# Patient Record
Sex: Female | Born: 1975 | Race: Black or African American | Hispanic: No | Marital: Single | Smoking: Current every day smoker
Health system: Southern US, Community
[De-identification: ages and names within clinical notes are randomized; demographics above are authoritative.]

## PROBLEM LIST (undated history)

## (undated) VITALS — BP 141/100 | HR 73 | Temp 98.5°F | Resp 18 | Ht 67.0 in | Wt 253.0 lb

## (undated) DIAGNOSIS — F419 Anxiety disorder, unspecified: Secondary | ICD-10-CM

## (undated) DIAGNOSIS — D649 Anemia, unspecified: Secondary | ICD-10-CM

## (undated) DIAGNOSIS — F70 Mild intellectual disabilities: Secondary | ICD-10-CM

## (undated) DIAGNOSIS — G932 Benign intracranial hypertension: Secondary | ICD-10-CM

## (undated) DIAGNOSIS — F32A Depression, unspecified: Secondary | ICD-10-CM

## (undated) DIAGNOSIS — G43909 Migraine, unspecified, not intractable, without status migrainosus: Secondary | ICD-10-CM

## (undated) DIAGNOSIS — E876 Hypokalemia: Secondary | ICD-10-CM

## (undated) DIAGNOSIS — F329 Major depressive disorder, single episode, unspecified: Secondary | ICD-10-CM

## (undated) DIAGNOSIS — F259 Schizoaffective disorder, unspecified: Secondary | ICD-10-CM

## (undated) DIAGNOSIS — R17 Unspecified jaundice: Secondary | ICD-10-CM

## (undated) DIAGNOSIS — I10 Essential (primary) hypertension: Secondary | ICD-10-CM

## (undated) HISTORY — DX: Unspecified jaundice: R17

## (undated) HISTORY — DX: Hypokalemia: E87.6

## (undated) HISTORY — DX: Anemia, unspecified: D64.9

## (undated) HISTORY — PX: NO PAST SURGERIES: SHX2092

---

## 1998-02-01 ENCOUNTER — Emergency Department (HOSPITAL_COMMUNITY): Admission: EM | Admit: 1998-02-01 | Discharge: 1998-02-01 | Payer: Self-pay | Admitting: Emergency Medicine

## 1998-02-23 ENCOUNTER — Inpatient Hospital Stay (HOSPITAL_COMMUNITY): Admission: EM | Admit: 1998-02-23 | Discharge: 1998-03-01 | Payer: Self-pay | Admitting: Emergency Medicine

## 1999-04-18 ENCOUNTER — Emergency Department (HOSPITAL_COMMUNITY): Admission: EM | Admit: 1999-04-18 | Discharge: 1999-04-18 | Payer: Self-pay | Admitting: Emergency Medicine

## 1999-04-18 ENCOUNTER — Inpatient Hospital Stay (HOSPITAL_COMMUNITY): Admission: AD | Admit: 1999-04-18 | Discharge: 1999-04-26 | Payer: Self-pay | Admitting: *Deleted

## 2000-06-17 ENCOUNTER — Emergency Department (HOSPITAL_COMMUNITY): Admission: EM | Admit: 2000-06-17 | Discharge: 2000-06-17 | Payer: Self-pay | Admitting: Emergency Medicine

## 2000-11-30 ENCOUNTER — Emergency Department (HOSPITAL_COMMUNITY): Admission: EM | Admit: 2000-11-30 | Discharge: 2000-11-30 | Payer: Self-pay | Admitting: Emergency Medicine

## 2001-01-08 ENCOUNTER — Inpatient Hospital Stay (HOSPITAL_COMMUNITY): Admission: EM | Admit: 2001-01-08 | Discharge: 2001-01-12 | Payer: Self-pay | Admitting: *Deleted

## 2002-03-21 ENCOUNTER — Inpatient Hospital Stay (HOSPITAL_COMMUNITY): Admission: EM | Admit: 2002-03-21 | Discharge: 2002-03-26 | Payer: Self-pay | Admitting: Psychiatry

## 2002-09-29 ENCOUNTER — Encounter: Payer: Self-pay | Admitting: Emergency Medicine

## 2002-09-29 ENCOUNTER — Emergency Department (HOSPITAL_COMMUNITY): Admission: AC | Admit: 2002-09-29 | Discharge: 2002-09-30 | Payer: Self-pay

## 2005-06-23 ENCOUNTER — Emergency Department (HOSPITAL_COMMUNITY): Admission: EM | Admit: 2005-06-23 | Discharge: 2005-06-23 | Payer: Self-pay | Admitting: Emergency Medicine

## 2005-10-25 ENCOUNTER — Ambulatory Visit: Payer: Self-pay | Admitting: Nurse Practitioner

## 2005-11-22 ENCOUNTER — Ambulatory Visit: Payer: Self-pay | Admitting: Nurse Practitioner

## 2006-01-27 ENCOUNTER — Emergency Department (HOSPITAL_COMMUNITY): Admission: EM | Admit: 2006-01-27 | Discharge: 2006-01-27 | Payer: Self-pay | Admitting: Emergency Medicine

## 2006-03-25 ENCOUNTER — Emergency Department (HOSPITAL_COMMUNITY): Admission: EM | Admit: 2006-03-25 | Discharge: 2006-03-25 | Payer: Self-pay | Admitting: Emergency Medicine

## 2007-05-18 ENCOUNTER — Inpatient Hospital Stay (HOSPITAL_COMMUNITY): Admission: AD | Admit: 2007-05-18 | Discharge: 2007-05-18 | Payer: Self-pay | Admitting: Obstetrics & Gynecology

## 2008-08-20 ENCOUNTER — Inpatient Hospital Stay (HOSPITAL_COMMUNITY): Admission: AD | Admit: 2008-08-20 | Discharge: 2008-08-20 | Payer: Self-pay | Admitting: Family Medicine

## 2009-05-26 ENCOUNTER — Emergency Department (HOSPITAL_COMMUNITY): Admission: EM | Admit: 2009-05-26 | Discharge: 2009-05-26 | Payer: Self-pay | Admitting: Family Medicine

## 2010-09-13 ENCOUNTER — Emergency Department (HOSPITAL_COMMUNITY): Admission: EM | Admit: 2010-09-13 | Discharge: 2010-04-21 | Payer: Self-pay | Admitting: Emergency Medicine

## 2010-12-07 ENCOUNTER — Encounter: Payer: Self-pay | Admitting: Obstetrics & Gynecology

## 2010-12-17 ENCOUNTER — Inpatient Hospital Stay (INDEPENDENT_AMBULATORY_CARE_PROVIDER_SITE_OTHER)
Admission: RE | Admit: 2010-12-17 | Discharge: 2010-12-17 | Disposition: A | Discharge status: Home / Self Care | Payer: Medicaid Other | Source: Ambulatory Visit | Attending: Family Medicine | Admitting: Family Medicine

## 2010-12-17 DIAGNOSIS — IMO0002 Reserved for concepts with insufficient information to code with codable children: Secondary | ICD-10-CM

## 2010-12-22 LAB — COMPREHENSIVE METABOLIC PANEL
ALT: 23 U/L (ref 0–35)
AST: 32 U/L (ref 0–37)
Alkaline Phosphatase: 77 U/L (ref 39–117)
CO2: 26 mEq/L (ref 19–32)
Calcium: 9 mg/dL (ref 8.4–10.5)
GFR calc Af Amer: >60 mL/min (ref >60)
GFR calc non Af Amer: >60 mL/min (ref >60)
Glucose, Bld: 87 mg/dL (ref 70–99)
Potassium: 2.8 mEq/L — ABNORMAL LOW (ref 3.5–5.1)
Sodium: 145 mEq/L (ref 135–145)
Total Protein: 7.5 g/dL (ref 6.0–8.3)

## 2010-12-22 LAB — CULTURE, ROUTINE-ABSCESS

## 2010-12-22 LAB — CBC
HCT: 39.9 % (ref 36.0–46.0)
Hemoglobin: 13.4 g/dL (ref 12.0–15.0)
MCHC: 33.7 g/dL (ref 30.0–36.0)
WBC: 8.8 10*3/uL (ref 4.0–10.5)

## 2010-12-22 LAB — URINALYSIS, ROUTINE W REFLEX MICROSCOPIC
Glucose, UA: NEGATIVE mg/dL
Hgb urine dipstick: NEGATIVE
Ketones, ur: 15 mg/dL — AB
Specific Gravity, Urine: 1.037 — ABNORMAL HIGH (ref 1.005–1.030)
pH: 5 (ref 5.0–8.0)

## 2010-12-22 LAB — URINE MICROSCOPIC-ADD ON

## 2010-12-22 LAB — URINE CULTURE

## 2010-12-22 LAB — DIFFERENTIAL
Basophils Relative: 1 % (ref 0–1)
Eosinophils Absolute: 0.2 10*3/uL (ref 0.0–0.7)
Eosinophils Relative: 2 % (ref 0–5)
Lymphs Abs: 3.7 10*3/uL (ref 0.7–4.0)
Monocytes Relative: 9 % (ref 3–12)

## 2011-02-22 NOTE — Discharge Summary (Signed)
[facility]  Patient:    Sarah [name], Sarah [name]                     MRN: [id number] Adm. Date:  [id number] Disc. Date: [id number] Attending:  Denny Peon Dictator:   Landry Corporal, N.P.                           Discharge Summary  IDENTIFYING INFORMATION:  This is a 35 year old African-American female, single, admitted on a involuntary basis.  Patient was signed in by her legal guardian.  HISTORY OF PRESENT ILLNESS:   Patient was having an acute psychotic episode. An emergency commitment was done.  Immediately upon entering the unit, patient became completely undressed, running naked down the halls, screaming.  Patient was hypomanic and could not be controlled, could not be redirected.  Patient had to be secluded with forced medications and put in restraints, as she was threatening staff and other patients.  It appeared that patient might be responding to internal stimuli.  Patient was unable to give informed consent for treatment.  Patient was referred to Behavioral Health from [facility].  Patient apparently had been decompensating for the past 6 weeks and noncompliant with her medications.  Patient goes to [facility] for outpatient treatment.  PAST MEDICAL HISTORY:  Patient goes to [facility].  Patients medical problems include a urinary tract infection, uterine bleeding, and a history of anemia.  ADMISSION MEDICATIONS:  Bactrim double strength 1 p.o. b.i.d.  ALLERGIES:  No known drug allergies.  PHYSICAL EXAMINATION:  Performed at [facility] Emergency Department where she was treated for a urinary tract infection.  LAB WORK:  Urinalysis revealed with urinary tract infection with ketones +40, color was red and turbid, ______ with many ______ cells, positive nitrates, and leukocytes, moderate amount of leukocytes.  Other lab work was within normal limits.  MENTAL STATUS EXAMINATION: This is an  overweight young black female dressed in hospital gown.  She was passively cooperative, appeared to be somewhat sedated.  Her eye contact was fair at times.  Speech was actually very slow, probably due to her medication, generally relevant, and on admission her speech was pressured, mood was hypomanic, labile, and agitated, and her mood was certainly hostile.  Currently she seems to be a Coward bit sedated.  Her mood is perhaps a Dena bit suspicious.  Her flat is flat.  She denies any suicidal ideation or intent, denies homicidal ideation or intent.  Thought processes:  Prior to admission she was paranoid with delusional thinking that people were out to get her.  She acknowledges hearing voices telling her to beat somebody up just prior to admission, and currently denies any hallucinations, denies any delusions.  Cognitively, she is slightly sleepy, but she is oriented.  Her cognitive function appears to be consistent with her current intellectual functioning.  Her judgment is poor, insight is poor, and impulse control is poor.  ADMISSION DIAGNOSES: Axis I:     Schizoaffective disorder with manic state or bipolar disorder,             manic. 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 Axis III:   Vaginal bleeding, hypokalemia, urinary tract infection. Axis IV:    Severe, related to her mental illness. Axis V:  Current global assessment of function is 20, this past year             is 50.  HOSPITAL COURSE:  Patient was admitted for her psychotic behavior, was admitted under emergency commitment due to her acute psychosis.  Patient had to have medications forced and was placed in restraints.  We continued her Bactrim for her urinary tract infection.  Patient was also hypokalemic and was given potassium supplements.  Depakote was also administered for her manic symptoms.  Patient was to be monitored in regards to her fluids.   With medication patient became very calm and cooperative.  She was talking very lucid and able to contract for safety.  She continued to show no bizarre behaviors, at times needing some redirection but was able to follow instructions.  She continued to improve, not having any hallucinations. Patient continued to improve.  She was sleeping better.  Her appetite was good.  She was denying any depression or anxiety or dangerous thoughts.  Her thinking was clear.  Her potassium level had increased to within normal limits.  CONDITION ON DISCHARGE:  Patient was doing very well.  She was sleeping well, with no suicidal ideation.  She was to be discharged to her grandmother.  FOLLOW UP:  With [facility] with appointment and phone number provided.  Patient was to see Harolyn Rutherford at the reentry clinic.  There were no restrictions for diet or activity.  DISCHARGE MEDICATIONS: 1. Depakote 250 mg 1 t.i.d. 2. Haldol 5 mg 1 q.h.s. 3. Seroquel 25 mg 1 q.a.m., 4 p.m., and at night. 4. Cogentin 1 mg 1/2 to 1 three times a day as needed for side effects. 5. Bactrim Double Strength, 1 twice a day for the next 4 days.  INSTRUCTIONS:  That her grandmother will secure her medications.  She was to drink orange and tomato juice and her liver panel will be checked as well as her Depakote over the next 2 weeks.  Patient was to call or come to the emergency room if she was having problems with her medications or recurrence of symptoms.  Patient was also to make an appointment with her primary care Coben Godshall for abdominal cramps and low potassium level.  FINAL DIAGNOSES: Axis I:     Schizoaffective disorder with manic state, or bipolar disorder,             manic. Axis II:    Mild mental retardation. Axis III:   Vaginal bleeding, hypokalemia, urinary tract infection. Axis IV:    Severe psychosocial stressors. Axis V:     Current  global assessment of function 50, highest past  year 50. DD:  02/10/01 TD:  02/10/01 Job: [id number]

## 2011-02-22 NOTE — H&P (Signed)
[facility]  Patient:    Sarah [name], Sarah [name]                     MRN: [id number] Adm. Date:  [id number] Disc. Date: [id number] Attending:  Sandi Raveling Dictator:   Sarah [name], N.P.                         History and Physical  IDENTIFYING INFORMATION:  Sarah [name] is a 35 year old African-American female, single, admitted on a voluntary basis January 07, 2001 and she was signed in by her legal guardian, Sarah [name]. However, due to her acute psychosis, an emergency commitment was done. The patient immediately upon entering the unit became completely undressed, she was naked, running up and down the hall screaming and again, she was naked. She was near all patients who were lining up to go to lunch. She was totally out of control. She was hypermanic and could not be controlled, could not be redirected. The patient had to be secluded, forced medications and also put in restraints because she was threatening to staff and to other patients. Dr. Milford Cage and Dr. Netta Cedars wrote the order for forced medications due to her acute florid psychosis. It appeared that she might be responding to internal stimuli and the patient was unable to give informed consent for treatment.  She was referred by [facility]. When they called stated that this patient was hypomanic and noncompliant with her medications, however, when she came to our unit she was floridly psychotic. She was hypermanic, she was out of control and a danger to self and others, and also threatening staff and other patients. Again, she immediately became undressed, was running up and down the halls, yelling, screaming, totally out of control, and we could not redirect her. She was agitated, apparently had been decompensating x 6 weeks. She had not been taking her medications. She was thinking that she was pregnant, however, she is not.  Her urine pregnancy test was negative. She was thinking she needed a D&C. I do need to state here that she was having her menstrual period. She reports no sleep for three days and not eating for three days. She states she did have auditory hallucinations prior to admission and the voices were telling her to hurt somebody or to beat someone up. She denies suicidal ideation or intent, she denies homicidal ideation or intent.  PAST PSYCHIATRIC HISTORY:  The patient apparently goes to [facility], however, there does not appear to be any follow-up and she has not been taking her meds and decompensated and became psychotic. Unknown when she was last seen by the mental health center apparently up until yesterday, January 08, 2001. She has been at [facility] May 1999 and July 2001.  PAST MEDICAL HISTORY:  The patient goes to [facility]. She states she was last seen there within the last three months. 1. Patient has a urinary tract infection. No other significant medical problems currently. 2. History of uterine bleeding. 3. History of anemia.  CURRENT MEDICATIONS:  Bactrim DS one p.o. b.i.d.  ALLERGIES:  No known drug allergies.  POSITIVE PHYSICAL FINDINGS:  Please see physical examination done at [facility] emergency department January 07, 2001 where she was treated for a urinary tract infection. It was recommended that she go on Bactrim DS one p.o. b.i.d.  LABORATORY AND ACCESSORY  DATA:  Her CBC was within normal limits with the exception of MCV that was 82.5 which is really within normal limits, however, low normal. Her GC and chlamydia probes were both negative. Her urine pregnancy test was negative. Her CMET was within normal limits with the exception of potassium that was 2.6. Urine drug screen negative. Blood alcohol level less than 5. Her urinalysis revealed that she did indeed have a urinary tract infection, ketones +40,  the color was red and turbid, the protein was 1.00, there were many clue cells, nitrites positive and also she had leukocytes, a moderate amount.  SOCIAL HISTORY:  The patient lives alone. She has a roommate. She is single. Her grandmother lives up the street. Her sister, Sarah [name] is her legal guardian. She has three brothers and one sister. She is focusing a lot on her mother. She states she has not seen her mother in 20 years, does not know where she is and basically wants to see her. She completed high school, however, she was in special classes. She is on Medicaid.  FAMILY HISTORY:  Question if mother has mental illness.  ALCOHOL AND DRUG HISTORY:  She denies alcohol or substance abuse.  MENTAL STATUS EXAMINATION:  This patient presented as an overweight young black female dressed in a hospital gown. She was passively cooperative, appeared to be somewhat sedated. Her eye contact was good at times. Her speech actually was slow, probably due to the medication and it was generally relevant. On admission her speech was pressured. Mood:  She was hypermanic, she was labile and agitated and her mood was certainly hostile. Currently, she seems to be a [place] bit sedated but her mood is perhaps a [place] bit suspicious. Her affect is flat. She denies suicidal ideation or intent and denies homicidal ideation and intent. Thought processes:  Prior to admission she was paranoid and delusional thinking that people were out to get her. She acknowledges hearing voices telling her to beat somebody up just prior to admission and currently denies any hallucinations and denies any delusions. Cognitive:  Slightly sleepy but she is oriented. Her cognitive functioning appears to be consistent with her current level of intellectual functioning. Her judgment is poor, insight poor and impulse control is poor.  CURRENT DIAGNOSES: Axis I:                       Schizoaffective disorder with manic  state                               or bipolar disorder, manic. 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  Axis III:                     Vaginal bleeding.                               Hypokalemia.  Urinary tract infection. Axis IV:                      Severe related to her mental illness. Axis V:                       Current global assessment of functioning is                               20, highest in past year is 50.  TREATMENT PLAN AND RECOMMENDATIONS:  Voluntary admission to [facility] behavioral health unit. She was signed in by her legal guardian Sarah [name], her sister. However, when she entered the unit she was floridly psychotic, a danger to herself and others and an emergency commitment due to her acute psychosis and out of control behavior as well as running up in the halls naked, that we had to take other action. She was screaming, running up and down in front of other patients, threatening staff and other patients. She entered the unit in this condition. She was immediately placed on a one-to-one. SERT had to be called. We had to force medications. Dr. Milford Cage and Dr. Netta Cedars did this. She had to be placed in seclusion with restraints due to her danger to self and others. She was given Haldol 5 mg IM/po plus order to give it every two hours for a max dose of 30 mg every 24 hours, Ativan 2 mg IM/po q.4h. STAT and p.r.n. for severe agitation every 4 hours, Benadryl 25 mg p.o. q.4h. p.r.n. a.p.s. We are going to start her on Seroquel 25 mg q.i.d. p.o., Bactrim DS one tab p.o. b.i.d. x 8 days. She had hypokalemia so she was given K-Dur 20 mEq STAT and K-Dur 20 mEq at h.s., Depakote 250 mg p.o. t.i.d. for her manic symptoms. She is also to have her fluids monitored and the intake was to be recorded on the chart. We were able to  discontinue the restraints at 8:45 p.m. and she was more cooperative. Currently, she remains on a one-to-one due to her behavior over the last 24 hours. We will re-evaluate this.  Tentative length of stay and discharge plan is 3-7 days. DD:  01/09/01 TD:  01/09/01 Job: [id number]

## 2011-02-22 NOTE — Discharge Summary (Signed)
[facility]  Patient:    Sarah [name], Sarah [name] Visit Number: [id number] MRN: [id number]          Service Type: PSY Location: [id number] Attending Physician:  Jeanice Lim Dictated by:   Jeanice Lim, M.D. Admit Date:  03/21/2002 Discharge Date: 03/26/2002                             Discharge Summary  IDENTIFYING DATA:  This is a 35 year old single African-American female found naked in a trailer in a disheveled apartment, committed by family and [facility].  Has a history of mood swings.  No recent alcohol use.  Has been treated with Depakote, question of compliance.  Police brought the patient to [facility].  ADMISSION MEDICATIONS:  Depakote.  ALLERGIES:  No known drug allergies.  PHYSICAL EXAMINATION:  GENERAL:  Unremarkable.  NEUROLOGIC:  Nonfocal.  ROUTINE ADMISSION LABORATORY DATA:  Essentially within normal limits. Potassium low.  Valproic acid level on 6/17 22, on 6/20 83.  Thyroid panel: Within normal limits.  MENTAL STATUS EXAMINATION:  Alert and oriented with no psychomotor abnormalities.  The patient was dressed in pajamas.  Speech: Mostly clear. Mood: Calm but dysphoric.  Affect: Restricted.  Thought process: Positive for mild paranoid ideation, mood swings, and auditory hallucinations.  Cognitive: Intact.  The patient appeared to have borderline intelligence.  Judgment and insight: Limited.  ADMITTING DIAGNOSES: Axis I:    Schizoaffective disorder, bipolar type. Axis II:   None. Axis III:  None. Axis IV:   Moderate problems with primary support group and housing problems. Axis V:    35/55  HOSPITAL COURSE:  The patient was restabilized on medications and Zyprexa was added to stabilize mood and psychotic symptoms.  The patient reported a positive response to medication changes.  CONDITION AT DISCHARGE:  Markedly improved.  Mood: More euthymic.  Affect: Brighter.   Thought process: Goal directed.  Thought content: Negative for dangerous ideation or psychotic symptoms.  The patient reported motivation to be compliant with the followup plan and take medications as directed.  DISCHARGE MEDICATIONS: 1. Depakote ER 500 mg two q.h.s. 2. Zyprexa 7.5 mg q.h.s. 3. Trazodone 50 mg q.h.s. p.r.n. insomnia.  FOLLOWUP:  [facility], Monday, June 23 at 9:30.  DISCHARGE DIAGNOSES: Axis I:    Schizoaffective disorder, bipolar type. Axis II:   None. Axis III:  None. Axis IV:   Moderate problems with primary support group and housing problems. Axis V:    Global assessment of functioning on discharge was 55. Dictated by:   Jeanice Lim, M.D. Attending Physician:  Jeanice Lim DD:  04/28/02 TD:  04/30/02 Job: [id number]

## 2011-07-09 LAB — POCT PREGNANCY, URINE: Preg Test, Ur: NEGATIVE

## 2011-07-09 LAB — CBC
HCT: 36.9
Hemoglobin: 12.4
MCV: 91.3
RBC: 4.04
WBC: 7.4

## 2011-07-09 LAB — GC/CHLAMYDIA PROBE AMP, GENITAL
Chlamydia, DNA Probe: NEGATIVE
GC Probe Amp, Genital: NEGATIVE

## 2011-07-09 LAB — WET PREP, GENITAL: Yeast Wet Prep HPF POC: NONE SEEN

## 2011-07-22 LAB — WET PREP, GENITAL
Trich, Wet Prep: NONE SEEN
Yeast Wet Prep HPF POC: NONE SEEN

## 2011-07-22 LAB — URINALYSIS, ROUTINE W REFLEX MICROSCOPIC
Hgb urine dipstick: NEGATIVE
Protein, ur: NEGATIVE
Urobilinogen, UA: 0.2

## 2011-07-22 LAB — POCT PREGNANCY, URINE: Preg Test, Ur: NEGATIVE

## 2011-10-17 ENCOUNTER — Emergency Department (HOSPITAL_COMMUNITY)
Admission: EM | Admit: 2011-10-17 | Discharge: 2011-10-17 | Disposition: A | Discharge status: Home / Self Care | Payer: Medicaid Other | Attending: Emergency Medicine | Admitting: Emergency Medicine

## 2011-10-17 ENCOUNTER — Encounter (HOSPITAL_COMMUNITY): Payer: Self-pay

## 2011-10-17 DIAGNOSIS — R220 Localized swelling, mass and lump, head: Secondary | ICD-10-CM | POA: Insufficient documentation

## 2011-10-17 DIAGNOSIS — I10 Essential (primary) hypertension: Secondary | ICD-10-CM | POA: Insufficient documentation

## 2011-10-17 DIAGNOSIS — R51 Headache: Secondary | ICD-10-CM | POA: Insufficient documentation

## 2011-10-17 DIAGNOSIS — R221 Localized swelling, mass and lump, neck: Secondary | ICD-10-CM | POA: Insufficient documentation

## 2011-10-17 DIAGNOSIS — K047 Periapical abscess without sinus: Secondary | ICD-10-CM | POA: Insufficient documentation

## 2011-10-17 HISTORY — DX: Essential (primary) hypertension: I10

## 2011-10-17 MED ORDER — HYDROCODONE-ACETAMINOPHEN 5-500 MG PO TABS: 1.0000 | ORAL_TABLET | Freq: Four times a day (QID) | ORAL | Status: AC | PRN

## 2011-10-17 MED ORDER — CLINDAMYCIN HCL 150 MG PO CAPS: 300.0000 mg | ORAL_CAPSULE | Freq: Four times a day (QID) | ORAL | Status: AC

## 2011-10-17 MED ORDER — IBUPROFEN 200 MG PO TABS
600.0000 mg | ORAL_TABLET | Freq: Once | ORAL | Status: AC
  Administered 2011-10-17: 600 mg via ORAL
  Filled 2011-10-17: qty 3

## 2011-10-17 MED ORDER — CLINDAMYCIN HCL 300 MG PO CAPS
300.0000 mg | ORAL_CAPSULE | Freq: Once | ORAL | Status: AC
  Administered 2011-10-17: 300 mg via ORAL
  Filled 2011-10-17: qty 1

## 2011-10-17 MED ORDER — HYDROCODONE-ACETAMINOPHEN 5-325 MG PO TABS
1.0000 | ORAL_TABLET | Freq: Once | ORAL | Status: AC
  Administered 2011-10-17: 1 via ORAL
  Filled 2011-10-17: qty 1

## 2011-10-17 MED ORDER — IBUPROFEN 400 MG PO TABS: 400.0000 mg | ORAL_TABLET | ORAL | Status: AC | PRN

## 2011-10-17 NOTE — ED Notes (Signed)
Pt states that 2 days ago she developed an abscess on the left lower portion of her jaw. Pt is unsure if she has a broken tooth or a cavity associated with the left last molar. No meds pta. Unable to eat due to the pain.

## 2011-10-17 NOTE — ED Provider Notes (Signed)
History     CSN: [id number]  Arrival date & time 10/17/11  1520   First MD Initiated Contact with Patient 10/17/11 1940      Chief Complaint  Patient presents with  . Abscess    (Consider location/radiation/quality/duration/timing/severity/associated sxs/prior treatment) HPI history from patient Patient is a 36 year old female with history of poor dentition presents with dental abscess and facial swelling. She has had pain and swelling for 2 days. It has been constant and progressively worsening. Pain is worse when she eats and drinks. No fever, nausea, vomiting or other systemic symptoms. She is able to open mouth fully. Tolerating by mouth fine. Overall severity moderate.  Past Medical History  Diagnosis Date  . Hypertension     History reviewed. No pertinent past surgical history.  History reviewed. No pertinent family history.  History  Substance Use Topics  . Smoking status: Current Everyday Smoker  . Smokeless tobacco: Not on file  . Alcohol Use: No    OB History    Grav Para Term Preterm Abortions TAB SAB Ect Mult Living                  Review of Systems  Constitutional: Negative for fever and chills.  HENT: Negative for facial swelling.   Eyes: Negative for visual disturbance.  Respiratory: Negative for cough, chest tightness, shortness of breath and wheezing.   Cardiovascular: Negative for chest pain.  Gastrointestinal: Negative for nausea, vomiting, abdominal pain and diarrhea.  Genitourinary: Negative for difficulty urinating.  Skin: Negative for rash.  Neurological: Negative for weakness and numbness.  Psychiatric/Behavioral: Negative for behavioral problems and confusion.  All other systems reviewed and are negative.    Allergies  Review of patient's allergies indicates no known allergies.  Home Medications   Current Outpatient Rx  Name Route Sig Dispense Refill  . CLINDAMYCIN HCL 150 MG PO CAPS Oral Take 2 capsules (300 mg total) by mouth  every 6 (six) hours. 56 capsule 0  . HYDROCODONE-ACETAMINOPHEN 5-500 MG PO TABS Oral Take 1-2 tablets by mouth every 6 (six) hours as needed for pain. 20 tablet 0  . IBUPROFEN 400 MG PO TABS Oral Take 1 tablet (400 mg total) by mouth every 4 (four) hours as needed for pain. 30 tablet 0    BP 144/72  Pulse 81  Temp(Src) 98.7 F (37.1 C) (Oral)  Resp 20  SpO2 98%  LMP 09/22/2011  Physical Exam  Nursing note and vitals reviewed. Constitutional: She is oriented to person, place, and time. She appears well-developed and well-nourished. No distress.  HENT:  Head: Normocephalic and atraumatic.  Nose: Nose normal.       Poor dentition throughout Left lower molars: Small dental abscess the first and second lower left molar. Abscesses draining on her own. No significant remaining fluctuance or induration. Associated dental decay. Mild facial swelling to left lower jaw is tender to palpation. No significant swelling to submental or submandibular space. No tenderness palpating floor of mouth.  Eyes: EOM are normal.  Neck: Normal range of motion. No JVD present.  Cardiovascular: Normal rate and intact distal pulses.   Pulmonary/Chest: Effort normal. No respiratory distress.  Abdominal: Soft.       No visible injury  Musculoskeletal: Normal range of motion.       No pain with range of motion and no visible injury  Neurological: She is alert and oriented to person, place, and time. No cranial nerve deficit. Coordination normal.  Skin: Skin is warm and dry.  She is not diaphoretic.  Psychiatric: She has a normal mood and affect. Her behavior is normal. Thought content normal.    ED Course  Procedures (including critical care time)  Labs Reviewed - No data to display No results found.   1. Dental abscess       MDM   Clinical picture is consistent with dental abscess and associated facial swelling. Patient has no trismus, fever, difficulty tolerating by mouth. She overall is well  appearing. She has dentist but would prefer referral to another one. Since abscesses draining spontaneously and was completely drained, no indication for further incision and drainage. We'll treat with clindamycin considering facial swelling and have her follow up with dentistry. Return precautions discussed.        Milus Glazier 10/18/11 0144

## 2011-10-17 NOTE — ED Provider Notes (Signed)
36 year old female comes in with progressive pain and swelling in the left side of her jaw for the last several days. It hurts to eat and it hurts to open her mouth. She denies fever chills or sweats. Examination shows a periapical abscess of tooth #18 with pus being able to be expressed. There is moderate amount of soft tissue swelling in the left submandibular area. She will need to be referred to dentist for probable root canal. Today she'll be sent home on antibiotics and analgesics.  [name], MD 10/17/11 2016

## 2011-10-17 NOTE — ED Notes (Signed)
First meeting patient. Patient states she noticed her left side jaw was sore x 2 days ago and since then it has continued to increase in pain and today the pain is worse and the left jaw area is swollen. Patient states that her left jaw hurts to touch or open her mouth. Left jaw does appear swollen and is warm to touch.  Patient denies eating anything that would have injured a tooth and denies hitting herself or being hit to cause an injury to the left jaw.

## 2011-10-20 NOTE — ED Provider Notes (Signed)
Medical screening examination/treatment/procedure(s) were performed by non-physician practitioner and as supervising physician I was immediately available for consultation/collaboration.   [name], MD 10/20/11 [phone]

## 2012-01-01 ENCOUNTER — Encounter (HOSPITAL_COMMUNITY): Payer: Self-pay | Admitting: *Deleted

## 2012-01-01 ENCOUNTER — Emergency Department (INDEPENDENT_AMBULATORY_CARE_PROVIDER_SITE_OTHER)
Admission: EM | Admit: 2012-01-01 | Discharge: 2012-01-01 | Disposition: A | Discharge status: Home / Self Care | Payer: Medicaid Other | Source: Home / Self Care | Attending: Emergency Medicine | Admitting: Emergency Medicine

## 2012-01-01 DIAGNOSIS — L03211 Cellulitis of face: Secondary | ICD-10-CM

## 2012-01-01 DIAGNOSIS — L0291 Cutaneous abscess, unspecified: Secondary | ICD-10-CM

## 2012-01-01 DIAGNOSIS — L0390 Cellulitis, unspecified: Secondary | ICD-10-CM

## 2012-01-01 MED ORDER — SULFAMETHOXAZOLE-TMP DS 800-160 MG PO TABS: 2.0000 | ORAL_TABLET | Freq: Two times a day (BID) | ORAL | Status: AC

## 2012-01-01 NOTE — ED Notes (Signed)
Boil  On  l  Side     Face        X  3  Days         Pt  Reports  She  Has  Been  Applying  Warm  Rags  To  The  Area    denys  Any other  Symptoms         Airway    Intact

## 2012-01-01 NOTE — ED Provider Notes (Signed)
Chief Complaint  Patient presents with  . Recurrent Skin Infections    History of Present Illness:   The patient is a 36 year old female who has had a three-day history of a painful abscess on her left jaw. This has been draining some pus. She's never had anything like this before, and denies any abscesses elsewhere. She denies fever, chills, history of MRSA, or history of diabetes.  Review of Systems:  Other than noted above, the patient denies any of the following symptoms: Systemic:  No fever, chills or sweats. Skin:  No rash or itching.  PMFSH:  Past medical history, family history, social history, meds, and allergies were reviewed.  Physical Exam:   Vital signs:  BP 190/98  Pulse 72  Temp(Src) 98.6 F (37 C) (Oral)  Resp 16  SpO2 100%  LMP 01/01/2012 Skin:  There is a 1.5 cm raised, tender, fluctuant, abscess on the left jaw which was draining a small amount of pus.  Otherwise normal.  No rash.  Procedure:  Verbal informed consent was obtained.  The patient was informed of the risks and benefits of the procedure and understands and accepts.  Identity of the patient was verified verbally and by wristband.   The abscess area described above was prepped with Betadine and alcohol and anesthetized with 3 mL of 2% Xylocaine with epinephrine.  Using a #11 scalpel blade, a singe straight incision was made into the area of fluctulence, yielding a small amount of prurulent drainage.  Routine cultures were obtained.  Blunt dissection was used to break up loculations and the resulting wound cavity was packed with 1/4 inch Iodoform gauze.  A sterile pressure dressing was applied.  Assessment:   Diagnoses that have been ruled out:  None  Diagnoses that are still under consideration:  None  Final diagnoses:  Abscess    Plan:   1.  The following meds were prescribed:   New Prescriptions   SULFAMETHOXAZOLE-TRIMETHOPRIM (BACTRIM DS) 800-160 MG PER TABLET    Take 2 tablets by mouth 2 (two)  times daily.   2.  The patient was instructed in symptomatic care and handouts were given. 3.  The patient was instructed to leave the dressing in place and return again in 48 hours for packing removal.  Reuben Likes, MD 01/01/12 2200

## 2012-01-01 NOTE — Discharge Instructions (Signed)

## 2012-01-04 LAB — CULTURE, ROUTINE-ABSCESS: Gram Stain: NONE SEEN

## 2012-01-14 ENCOUNTER — Emergency Department (HOSPITAL_COMMUNITY)
Admission: EM | Admit: 2012-01-14 | Discharge: 2012-01-15 | Disposition: A | Discharge status: Other Acute Inpatient Hospital | Payer: Medicaid Other | Source: Home / Self Care | Attending: Emergency Medicine | Admitting: Emergency Medicine

## 2012-01-14 ENCOUNTER — Other Ambulatory Visit: Payer: Self-pay

## 2012-01-14 ENCOUNTER — Encounter (HOSPITAL_COMMUNITY): Payer: Self-pay

## 2012-01-14 DIAGNOSIS — I10 Essential (primary) hypertension: Secondary | ICD-10-CM | POA: Insufficient documentation

## 2012-01-14 DIAGNOSIS — I440 Atrioventricular block, first degree: Secondary | ICD-10-CM | POA: Insufficient documentation

## 2012-01-14 DIAGNOSIS — T394X2A Poisoning by antirheumatics, not elsewhere classified, intentional self-harm, initial encounter: Secondary | ICD-10-CM | POA: Insufficient documentation

## 2012-01-14 DIAGNOSIS — F17200 Nicotine dependence, unspecified, uncomplicated: Secondary | ICD-10-CM | POA: Insufficient documentation

## 2012-01-14 DIAGNOSIS — R45851 Suicidal ideations: Secondary | ICD-10-CM

## 2012-01-14 DIAGNOSIS — T1491XA Suicide attempt, initial encounter: Secondary | ICD-10-CM

## 2012-01-14 DIAGNOSIS — T398X2A Poisoning by other nonopioid analgesics and antipyretics, not elsewhere classified, intentional self-harm, initial encounter: Secondary | ICD-10-CM | POA: Insufficient documentation

## 2012-01-14 DIAGNOSIS — T39314A Poisoning by propionic acid derivatives, undetermined, initial encounter: Secondary | ICD-10-CM | POA: Insufficient documentation

## 2012-01-14 DIAGNOSIS — T50901A Poisoning by unspecified drugs, medicaments and biological substances, accidental (unintentional), initial encounter: Secondary | ICD-10-CM

## 2012-01-14 LAB — RAPID URINE DRUG SCREEN, HOSP PERFORMED
Amphetamines: NOT DETECTED
Cocaine: NOT DETECTED
Opiates: NOT DETECTED
Tetrahydrocannabinol: POSITIVE — AB

## 2012-01-14 LAB — COMPREHENSIVE METABOLIC PANEL
ALT: 15 U/L (ref 0–35)
Albumin: 3.6 g/dL (ref 3.5–5.2)
Alkaline Phosphatase: 76 U/L (ref 39–117)
Potassium: 4.2 mEq/L (ref 3.5–5.1)
Sodium: 138 mEq/L (ref 135–145)
Total Protein: 7.2 g/dL (ref 6.0–8.3)

## 2012-01-14 LAB — CBC
MCHC: 33.2 g/dL (ref 30.0–36.0)
RDW: 13.8 % (ref 11.5–15.5)

## 2012-01-14 MED ORDER — ARIPIPRAZOLE 2 MG PO TABS
2.0000 mg | ORAL_TABLET | Freq: Every day | ORAL | Status: DC
Start: 2012-01-14 — End: 2012-01-15
  Administered 2012-01-14: 2 mg via ORAL
  Filled 2012-01-14 (×2): qty 1

## 2012-01-14 MED ORDER — ALUM & MAG HYDROXIDE-SIMETH 200-200-20 MG/5ML PO SUSP
30.0000 mL | ORAL | Status: DC | PRN
  Filled 2012-01-14: qty 30

## 2012-01-14 MED ORDER — MAGNESIUM HYDROXIDE 400 MG/5ML PO SUSP
30.0000 mL | Freq: Every day | ORAL | Status: DC | PRN
  Filled 2012-01-14: qty 30

## 2012-01-14 MED ORDER — ALUM & MAG HYDROXIDE-SIMETH 200-200-20 MG/5ML PO SUSP: 30.0000 mL | ORAL | Status: DC | PRN

## 2012-01-14 MED ORDER — HYDROCHLOROTHIAZIDE 25 MG PO TABS
25.0000 mg | ORAL_TABLET | Freq: Every day | ORAL | Status: DC
  Administered 2012-01-14: 25 mg via ORAL
  Filled 2012-01-14 (×2): qty 1

## 2012-01-14 MED ORDER — ACETAMINOPHEN 325 MG PO TABS
650.0000 mg | ORAL_TABLET | Freq: Four times a day (QID) | ORAL | Status: DC | PRN
  Filled 2012-01-14: qty 2

## 2012-01-14 MED ORDER — ONDANSETRON HCL 4 MG PO TABS: 4.0000 mg | ORAL_TABLET | Freq: Three times a day (TID) | ORAL | Status: DC | PRN

## 2012-01-14 NOTE — ED Provider Notes (Addendum)
History     CSN: [id number]  Arrival date & time 01/14/12  1139   First MD Initiated Contact with Patient 01/14/12 1227      Chief Complaint  Patient presents with  . V70.1    states s/i     (Consider location/radiation/quality/duration/timing/severity/associated sxs/prior treatment) HPI Patient is a 36 year old female who presents today for suicidal ideation as well as attempt by taking pills. She took 10 pills of ibuprofen as well as 10 pills of other unknown pain medication. This occurred prior to arrival. Patient is not having any nausea, vomiting, abdominal pain, altered mental status. She reports that she recently had the death of a family member and did not want to live because of this. She has no medical problems other than high blood pressure. Patient does have depression and reports that she is treated with this.There are no other associated or modifying factors.  Past Medical History  Diagnosis Date  . Hypertension     History reviewed. No pertinent past surgical history.  No family history on file.  History  Substance Use Topics  . Smoking status: Current Everyday Smoker  . Smokeless tobacco: Not on file  . Alcohol Use: No    OB History    Grav Para Term Preterm Abortions TAB SAB Ect Mult Living                  Review of Systems  Constitutional: Negative.   HENT: Negative.   Eyes: Negative.   Respiratory: Negative.   Cardiovascular: Negative.   Gastrointestinal: Negative.   Genitourinary: Negative.   Musculoskeletal: Negative.   Skin: Negative.   Neurological: Negative.   Hematological: Negative.   Psychiatric/Behavioral: Positive for suicidal ideas and dysphoric mood.  All other systems reviewed and are negative.    Allergies  Review of patient's allergies indicates no known allergies.  Home Medications   Current Outpatient Rx  Name Route Sig Dispense Refill  . ARIPIPRAZOLE 2 MG PO TABS Oral Take 2 mg by mouth daily.    Marland Kitchen  HYDROCHLOROTHIAZIDE 25 MG PO TABS Oral Take 25 mg by mouth daily.      BP 166/87  Pulse 105  Temp 98.7 F (37.1 C)  Resp 22  SpO2 100%  LMP 11/27/2011  Physical Exam  Nursing note and vitals reviewed. GEN: Well-developed, well-nourished female in no distress HEENT: Atraumatic, normocephalic. Oropharynx clear without erythema EYES: PERRLA BL, no scleral icterus. NECK: Trachea midline, no meningismus CV: regular rate and rhythm. No murmurs, rubs, or gallops PULM: No respiratory distress.  No crackles, wheezes, or rales. GI: soft, non-tender. No guarding, rebound, or tenderness. + bowel sounds  GU: deferred Neuro: cranial nerves 2-12 intact, no abnormalities of strength or sensation, A and O x 3 MSK: Patient moves all 4 extremities symmetrically, no deformity, edema, or injury noted Skin: No rashes petechiae, purpura, or jaundice Psych: Endorses SI with attempt today.  ED Course  Procedures (including critical care time)   Date: 01/14/2012  Rate: 59  Rhythm: normal sinus rhythm  QRS Axis: normal  Intervals: PR prolonged  ST/T Wave abnormalities: normal  Conduction Disutrbances:first-degree A-V block   Narrative Interpretation: No concerning findings.  Old EKG Reviewed: none available        Labs Reviewed  CBC  COMPREHENSIVE METABOLIC PANEL  ETHANOL  ACETAMINOPHEN LEVEL  PREGNANCY, URINE  URINE RAPID DRUG SCREEN (HOSP PERFORMED)   No results found.   1. Ingestion of unknown drug   2. Suicide attempt   3.  Suicidal ideation       MDM  Patient was evaluated by myself. She was hemodynamically stable and asymptomatic. Patient is medically cleared. She was seen in behavioral health for this previously and has already been accepted for bad. Patient was moved to psych ED and holding orders were placed. Home medications were ordered. Behavioral health will be notified that patient's medically clear at this time.  2:38 PM Act has  been notified that the patient is  medically clear. She has been accepted by Dr. Renato Gails living. She will be moved to behavioral health once a 400 bed is available.        Cyndra Numbers, MD 01/14/12 1436  Cyndra Numbers, MD 01/14/12 [phone]

## 2012-01-14 NOTE — ED Notes (Signed)
Attempted to call report nurse to call back.

## 2012-01-14 NOTE — ED Provider Notes (Signed)
The patient was accepted at [facility] by Dr. Peyton Bottoms, MD 01/14/12 2350

## 2012-01-14 NOTE — ED Notes (Signed)
Attempted to call report x2 nurse unavailable at present time

## 2012-01-14 NOTE — ED Notes (Signed)
Pt went to [facility] for a bed. Pt escorted by [facility] tech. Pt has a bed pending at [facility] but does not have on at this time

## 2012-01-14 NOTE — ED Notes (Signed)
Pt states that her grandmother passed away last week and since then has had increasing depression.  Attempted suicide two days ago by taking an overdose of OTC pain meds.  Says she no longer feels SI and is denying HI.
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
## 2012-01-14 NOTE — ED Notes (Signed)
Pt states thoughts of s/i states took 10 Advil and 10 unknown otc pain medication

## 2012-01-15 ENCOUNTER — Inpatient Hospital Stay (HOSPITAL_COMMUNITY)
Admission: RE | Admit: 2012-01-15 | Discharge: 2012-01-17 | DRG: 885 | Disposition: A | Discharge status: Home / Self Care | Payer: Medicaid Other | Attending: Psychiatry | Admitting: Psychiatry

## 2012-01-15 ENCOUNTER — Encounter (HOSPITAL_COMMUNITY): Payer: Self-pay | Admitting: *Deleted

## 2012-01-15 DIAGNOSIS — F251 Schizoaffective disorder, depressive type: Secondary | ICD-10-CM | POA: Diagnosis present

## 2012-01-15 DIAGNOSIS — F259 Schizoaffective disorder, unspecified: Principal | ICD-10-CM | POA: Diagnosis present

## 2012-01-15 DIAGNOSIS — F1210 Cannabis abuse, uncomplicated: Secondary | ICD-10-CM | POA: Diagnosis present

## 2012-01-15 DIAGNOSIS — F411 Generalized anxiety disorder: Secondary | ICD-10-CM | POA: Diagnosis present

## 2012-01-15 DIAGNOSIS — I10 Essential (primary) hypertension: Secondary | ICD-10-CM | POA: Diagnosis present

## 2012-01-15 DIAGNOSIS — F17200 Nicotine dependence, unspecified, uncomplicated: Secondary | ICD-10-CM | POA: Diagnosis present

## 2012-01-15 HISTORY — DX: Depression, unspecified: F32.A

## 2012-01-15 HISTORY — DX: Major depressive disorder, single episode, unspecified: F32.9

## 2012-01-15 HISTORY — DX: Anxiety disorder, unspecified: F41.9

## 2012-01-15 LAB — URINALYSIS, ROUTINE W REFLEX MICROSCOPIC
Bilirubin Urine: NEGATIVE
Hgb urine dipstick: NEGATIVE
Protein, ur: NEGATIVE mg/dL
Urobilinogen, UA: 0.2 mg/dL (ref 0.0–1.0)

## 2012-01-15 MED ORDER — ACETAMINOPHEN 325 MG PO TABS
650.0000 mg | ORAL_TABLET | Freq: Four times a day (QID) | ORAL | Status: DC | PRN
  Administered 2012-01-15: 650 mg via ORAL

## 2012-01-15 MED ORDER — NICOTINE 14 MG/24HR TD PT24
14.0000 mg | MEDICATED_PATCH | Freq: Every day | TRANSDERMAL | Status: DC
  Administered 2012-01-15: 14 mg via TRANSDERMAL
  Filled 2012-01-15 (×4): qty 1

## 2012-01-15 MED ORDER — MAGNESIUM HYDROXIDE 400 MG/5ML PO SUSP: 30.0000 mL | Freq: Every day | ORAL | Status: DC | PRN

## 2012-01-15 MED ORDER — ALUM & MAG HYDROXIDE-SIMETH 200-200-20 MG/5ML PO SUSP: 30.0000 mL | ORAL | Status: DC | PRN

## 2012-01-15 MED ORDER — PNEUMOCOCCAL VAC POLYVALENT 25 MCG/0.5ML IJ INJ
0.5000 mL | INJECTION | Freq: Once | INTRAMUSCULAR | Status: AC
  Administered 2012-01-15: 0.5 mL via INTRAMUSCULAR

## 2012-01-15 MED ORDER — CITALOPRAM HYDROBROMIDE 20 MG PO TABS
20.0000 mg | ORAL_TABLET | Freq: Every day | ORAL | Status: DC
  Administered 2012-01-15 – 2012-01-17 (×3): 20 mg via ORAL
  Filled 2012-01-15 (×4): qty 1

## 2012-01-15 MED ORDER — HYDROCHLOROTHIAZIDE 25 MG PO TABS
25.0000 mg | ORAL_TABLET | Freq: Every day | ORAL | Status: DC
  Administered 2012-01-15 – 2012-01-17 (×3): 25 mg via ORAL
  Filled 2012-01-15 (×4): qty 1

## 2012-01-15 MED ORDER — ARIPIPRAZOLE 5 MG PO TABS
5.0000 mg | ORAL_TABLET | Freq: Every day | ORAL | Status: DC
  Administered 2012-01-15 – 2012-01-16 (×2): 5 mg via ORAL
  Filled 2012-01-15 (×3): qty 1

## 2012-01-15 NOTE — ED Notes (Signed)
Pt educated about transfer to [facility] for inpt treatment. Pt verbalizes understanding and consents to transfer. Denies pain and further need, departs in safe and stable condition with one bag of belongings sent with her.

## 2012-01-15 NOTE — Progress Notes (Signed)
Pt has been up and has been active while in the milieu today, has been attending and participating in various activities, pt did endorse feelings of depression today and mentioned a recent death in the family as being a stressor, pt did receive all medications today without incident, will continue to monitor

## 2012-01-15 NOTE — Progress Notes (Addendum)
36yo female who presents voluntarily and in no acute distress for the treatment of SI, Depression, and Anxiety. Appears flat and depressed. Calm and cooperative with assessment. Somewhat childlike with her responses at times. No acute distress noted. States she has been getting increasingly depressed since the unexpected death of her Grandmother last week. States she was feeling suicidal and did attempt to OD on Sunday by taking 10 Advil and "10 other pills". Decided to come in yesterday for assessment. Over the last week she has experienced: Anxiety;Depression;Crying spells;Irritability;Insomnia;Loneliness;Nervousness;Restlessness;Sadness;Self-harm thoughts;Sleep disturbance and Worrying. Currently denies SI/HI/AVH and contracts for safety. States she does sometimes hear voices, but she was vague in her description of type and content. Lives with a roommate whom she considers her biggest support now that her Gearldine Shown has passed. Has been compliant with her meds. Denies Etoh and drug abuse, but UDS was positive for [facility] (she adamantly denied when confronted). Medical h/o HTN and is a 1/2 pk/day smoker. Patch and gum offered and patch accepted. Denies pain. Skin assessed by [facility], Rn and found to be clear. Food and fluids offered and refused. Unit policies and expectations reviewed and understanding verbalized. Consents obtained. Belongings searched by Mitzi Davenport, [facility] and items were placed in locker # 121. Escorted to unit and oriented by [place], [facility]. Emergency Contact Real Cons (roommate) [phone].

## 2012-01-15 NOTE — Progress Notes (Signed)
01/15/2012         Time: 0930      Group Topic/Focus: The focus of this group is on enhancing the patient's understanding of leisure, barriers to leisure, and the importance of engaging in positive leisure activities upon discharge for improved total health.  Participation Level: Minimal  Participation Quality: Attentive  Affect: Appropriate  Cognitive: Oriented  Additional Comments: Patient missed much of group as she was called out by [facility].   Cystal Shannahan 01/15/2012 12:54 PM

## 2012-01-15 NOTE — Tx Team (Signed)
Initial Interdisciplinary Treatment Plan  PATIENT STRENGTHS: (choose at least two) Ability for insight Active sense of humor Average or above average intelligence Capable of independent living Communication skills General fund of knowledge Motivation for treatment/growth Physical Health Supportive family/friends  PATIENT STRESSORS: Financial difficulties Loss of grandmother last week* Traumatic event   PROBLEM LIST: Problem List/Patient Goals Date to be addressed Date deferred Reason deferred Estimated date of resolution  Depression      Anxiety      SI                                           DISCHARGE CRITERIA:  Ability to meet basic life and health needs Adequate post-discharge living arrangements Improved stabilization in mood, thinking, and/or behavior Motivation to continue treatment in a less acute level of care Need for constant or close observation no longer present Reduction of life-threatening or endangering symptoms to within safe limits Safe-care adequate arrangements made Verbal commitment to aftercare and medication compliance  PRELIMINARY DISCHARGE PLAN: Outpatient therapy Return to previous living arrangement  PATIENT/FAMIILY INVOLVEMENT: This treatment plan has been presented to and reviewed with the patient, Sarah [name].  The patient has been given the opportunity to ask questions and make suggestions.  Arturo Morton 01/15/2012, 2:11 AM
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
## 2012-01-15 NOTE — Discharge Planning (Addendum)
Met with patient in Aftercare Planning Group.   She is here because of grief issues making her suicidal in the last few days.  She is not feeling suicidal today, per self report.  Patient had been a patient at [facility], does not want to return, and described reasonable causes for this feelings.  Case Manager told her about the variety of services offered by [facility], and she would like to go there.  Referral was made, awaiting call back.  Also discussed with patient the concept of using support groups as a means of help, and she agreed that grief support groups, depression support groups, and [facility] might also be worthwhile.  Utilization review done for coverage to begin.  No other case management needs today.  Ambrose Mantle, LCSW 01/15/2012, 2:06 PM

## 2012-01-15 NOTE — [facility] Suicide Risk Assessment (Signed)
Suicide Risk Assessment  Admission Assessment     Demographic factors:  Assessment Details Time of Assessment: Admission Information Obtained From: Patient Current Mental Status:  Current Mental Status:  (denies self harm thoughts) Loss Factors:  Loss Factors: Loss of significant relationship;Financial problems / change in socioeconomic status Historical Factors:  Historical Factors: Prior suicide attempts Risk Reduction Factors:  Risk Reduction Factors: Sense of responsibility to family;Living with another person, especially a relative;Positive social support;Positive therapeutic relationship;Positive coping skills or problem solving skills  CLINICAL FACTORS:   Depression:   Anhedonia Comorbid alcohol abuse/dependence Alcohol/Substance Abuse/Dependencies More than one psychiatric diagnosis Schizoaffective Disorder - Depressed Type.  COGNITIVE FEATURES THAT CONTRIBUTE TO RISK:  None Noted.    Diagnosis:  Axis I: Schizoaffective Disorder - Depressed Type.  Cannabis Abuse.   The patient was seen today and reports the following:   ADL's: Intact.  Sleep: The patient reports to sleeping well last night without difficulty.  Appetite: The patient reports a good appetite today.   Mild>(1-10) >Severe  Hopelessness (1-10): 0  Depression (1-10): 6  Anxiety (1-10): 0   Suicidal Ideation: The patient denies any current suicidal ideations but states this was occuring prior to admission.  Plan: No  Intent: No  Means: No   Homicidal Ideation: The patient denies any homicidal ideations.  Plan: No  Intent: No.  Means: No   General Appearance/Behavior: The patient was friendly and cooperative today with this physician.  Eye Contact: Good.  Speech: Appropriate in rate and volume today with no pressuring noted.  Motor Behavior: wnl.  Level of Consciousness: Alert and Oriented x 3.  Mental Status: Alert and Oriented x 3.  Mood: Moderately Depressed.  Affect: Moderately Constricted.    Anxiety Level: No anxiety noted or reported today.  Thought Process: wnl.  Thought Content: The patient reports episodic auditory hallucinations with the voice recently telling her to harm himself.  She reports to voices usually occur at night when she is attempting to sleep.  She denies any visual hallucinations or delusional thinking.  Perception:. wnl.  Judgment: Fair to Good.  Insight: Fair to Good.  Cognition: Oriented to person, place and time.   Time was spent today discussing with the patient her current symptoms. The patient reports to sleeping well with a good appetite. She denies any SI/HI but does report moderate depressive symptoms.  As above she reports episodic auditory hallucinations and denies any visual hallucinations or delusional thinking.  She states that he Grandmother died last weekend and this greatly contributed to her depression.  Treatment Plan Summary:  1. Daily contact with patient to assess and evaluate symptoms and progress in treatment  2. Medication management  3. The patient will deny suicidal ideations or homicidal ideations for 48 hours prior to discharge and have a depression and anxiety rating of 3 or less. The patient will also deny any auditory or visual hallucinations or delusional thinking.  4. The patient will deny any symptoms of substance withdrawal at time of discharge.   Plan:  1. Will start the medication Celexa at 20 mgs po q am for depression. 2. Will increase the medication Abilify to 5 mgs po qhs for auditory hallucinations. 3. Will continue the medication HCTZ 25 mgs po q am for hypertension. 4. Laboratory studies reviewed.  5. Will continue to monitor.   SUICIDE RISK:   Minimal: No identifiable suicidal ideation.  Patients presenting with no risk factors but with morbid ruminations; may be classified as minimal risk based on  the severity of the depressive symptoms  Sarah [name] 01/15/2012, 1:49 PM

## 2012-01-15 NOTE — Tx Team (Signed)
Interdisciplinary Treatment Plan Update (Adult)  Date:  01/15/2012  Time Reviewed:  10:15AM-11:00AM  Progress in Treatment: Attending groups:  Yes Participating in groups:   Yes  Taking medication as prescribed:    Yes Tolerating medication:  Yes  Family/Significant other contact made:  Not yet Patient understands diagnosis:   Yes Discussing patient identified problems/goals with staff:   Yes Medical problems stabilized or resolved:   Yes Denies suicidal/homicidal ideation:  Yes Issues/concerns per patient self-inventory:   None Other:    New problem(s) identified: No, Describe:    Reason for Continuation of Hospitalization: Depression Medication stabilization Other; describe Needs new aftercare set up  Interventions implemented related to continuation of hospitalization:  Medication monitoring and adjustment, safety checks Q15 min., suicide risk assessment, group therapy, psychoeducation, collateral contact, aftercare planning, ongoing physician assessments, medication education  Additional comments:  Not applicable  Estimated length of stay:  2-3 days  Discharge Plan:  Return home with roommate, follow up to be arranged  New goal(s):  Not applicable  Review of initial/current patient goals per problem list:   1.  Goal(s):  Deny SI for 48 hours prior to D/C.  Met:  No  Target date:  By Discharge   As evidenced by:  Denies today, needs 48 hours total  2.  Goal(s):  Reduce depression to no greater than 3 at D/C.  Met:  No  Target date:  By Discharge   As evidenced by:  "6" today  3.  Goal(s):  Reduce anxiety to no greater than 3 at D/C.  Met:  No  Target date:  By Discharge   As evidenced by:  "0" today, needs longer observation  4.  Goal(s):  Set up with aftercare providers & support groups.  Met:  No  Target date:  By Discharge   As evidenced by:  Not yet set up  Attendees: Patient:  Sarah [name]  01/15/2012 10:15AM-11:00AM  Family:       Physician:  Dr. Harvie Heck Readling 01/15/2012 10:15AM-11:00AM  Nursing:   Tacy Learn, RN 01/15/2012 10:15AM -11:00AM   Case Manager:  Ambrose Mantle, LCSW 01/15/2012 10:15AM-11:00AM  Counselor:  Veto Kemps, MT-BC 01/15/2012 10:15AM-11:00AM  Other:      Other:      Other:      Other:       Scribe for Treatment Team:   Sarina Ser, 01/15/2012, 10:15AM-11:15AM
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
## 2012-01-15 NOTE — Progress Notes (Signed)
Currently resting quietly in bed in right lateral position with eyes closed. Respirations are even and unlabored. No acute distress noted. Safety has been maintained with Q15 minute observation. Will continue current POC.  

## 2012-01-16 LAB — T4, FREE: Free T4: 1.12 ng/dL (ref 0.80–1.80)

## 2012-01-16 LAB — TSH: TSH: 2.036 u[IU]/mL (ref 0.350–4.500)

## 2012-01-16 LAB — T3, FREE: T3, Free: 3.7 pg/mL (ref 2.3–4.2)

## 2012-01-16 NOTE — Progress Notes (Signed)
[facility] Group Notes:  (Counselor/Nursing/MHT/Case Management/Adjunct)  01/16/2012 9:08 AM  Type of Therapy:  Psychoeducational Skills  Participation Level:  Active  Participation Quality:  Attentive  Summary of Progress/Problems:  Patient was attentive to speaker from mental health association. She asked about making an appointment with them and was interested in their services.   Tauri Ethington, Aram Beecham 01/16/2012, 9:08 AM

## 2012-01-16 NOTE — Progress Notes (Signed)
Slept well last nite, appetite is good, energy level is normal and ability to pay attention is good, depressed 3/10 and hopeless 1/10 today, denies Si or Hi, wants to go to grief counseling once discharged from this facility to assist her in the grief process, going to DR for meals, taking meds as ordered by MD. q48min safety checks continue and support offered Safety maintained

## 2012-01-16 NOTE — Progress Notes (Signed)
[facility] Group Notes:  (Counselor/Nursing/MHT/Case Management/Adjunct)  01/16/2012 3:58 PM  Type of Therapy:  Group Therapy  Participation Level:  Active  Participation Quality:  Attentive and Sharing  Affect:  Appropriate  Cognitive:  Appropriate  Insight:  Good  Engagement in Group:  Good  Engagement in Therapy:  Good  Modes of Intervention:  Education, Support and Exploration  Summary of Progress/Problems: Patient was more able to share her feelings today. Talked about feeling better since she had started talking about her feelings and losses. Plans to go to grief support groups. She stated she has had a tendency to keep her feelings inside and then when she lets them out she ends up in the hospital. She wants to do better about telling others how she feels. She identified her roommate as one of the people that supports her. Also talked about having a lot of time and plans to go to mental health association and the [facility].  [name] 01/16/2012, 3:58 PM

## 2012-01-16 NOTE — Progress Notes (Signed)
[facility] MD Progress Note  01/16/2012 4:26 PM  Diagnosis:  Axis I: Schizoaffective Disorder - Depressed Type.  Cannabis Abuse.   The patient was seen today and reports the following:   ADL's: Intact.  Sleep: The patient reports to sleeping well last night without difficulty.  Appetite: The patient reports a good appetite today.   Mild>(1-10) >Severe  Hopelessness (1-10): 0  Depression (1-10): 3  Anxiety (1-10): 3   Suicidal Ideation: The patient adamantly denies any suicidal ideations today.  Plan: No  Intent: No  Means: No   Homicidal Ideation: The patient adamantly denies any homicidal ideations.  Plan: No  Intent: No.  Means: No   General Appearance/Behavior: The patient remains friendly and cooperative today with this Physician.  Eye Contact: Good.  Speech: Appropriate in rate and volume today with no pressuring noted.  Motor Behavior: wnl.  Level of Consciousness: Alert and Oriented x 3.  Mental Status: Alert and Oriented x 3.  Mood: Mildly Depressed.  Affect: Mildly Constricted.  Anxiety Level: Mild anxiety reported. Thought Process: wnl.  Thought Content: The patient denies any auditory or visual hallucinations today as well as any delusional thinking. Perception:. wnl.  Judgment: Fair to Good.  Insight: Fair to Good.  Cognition: Oriented to person, place and time.  Sleep:  Number of Hours: 6.5    Vital Signs:Blood pressure 159/110, pulse 86, temperature 97.6 F (36.4 C), temperature source Oral, resp. rate 18, height 5\' 7"  (1.702 m), weight 114.76 kg (253 lb), last menstrual period 11/27/2011.  Current Medications: Current Facility-Administered Medications  Medication Dose Route Frequency Provider Last Rate Last Dose  . acetaminophen (TYLENOL) tablet 650 mg  650 mg Oral Q6H PRN Ronny Bacon, MD   650 mg at 01/15/12 2209  . alum & mag hydroxide-simeth (MAALOX/MYLANTA) 200-200-20 MG/5ML suspension 30 mL  30 mL Oral Q4H PRN Curlene Labrum Masie Bermingham, MD      .  ARIPiprazole (ABILIFY) tablet 5 mg  5 mg Oral QHS Curlene Labrum Niall Illes, MD   5 mg at 01/15/12 2123  . citalopram (CELEXA) tablet 20 mg  20 mg Oral Q breakfast Curlene Labrum Guilianna Mckoy, MD   20 mg at 01/16/12 0817  . hydrochlorothiazide (HYDRODIURIL) tablet 25 mg  25 mg Oral Q breakfast Curlene Labrum Sanaiya Welliver, MD   25 mg at 01/16/12 4540  . magnesium hydroxide (MILK OF MAGNESIA) suspension 30 mL  30 mL Oral Daily PRN Curlene Labrum Damari Suastegui, MD      . nicotine (NICODERM CQ - dosed in mg/24 hours) patch 14 mg  14 mg Transdermal Daily Ronny Bacon, MD   14 mg at 01/15/12 1155   Lab Results:  Results for orders placed during the hospital encounter of 01/15/12 (from the past 48 hour(s))  URINALYSIS, ROUTINE W REFLEX MICROSCOPIC     Status: Abnormal   Collection Time   01/15/12  9:37 AM      Component Value Range Comment   Color, Urine YELLOW  YELLOW     APPearance CLOUDY (*) CLEAR     Specific Gravity, Urine 1.025  1.005 - 1.030     pH 5.5  5.0 - 8.0     Glucose, UA NEGATIVE  NEGATIVE (mg/dL)    Hgb urine dipstick NEGATIVE  NEGATIVE     Bilirubin Urine NEGATIVE  NEGATIVE     Ketones, ur NEGATIVE  NEGATIVE (mg/dL)    Protein, ur NEGATIVE  NEGATIVE (mg/dL)    Urobilinogen, UA 0.2  0.0 - 1.0 (mg/dL)  Nitrite NEGATIVE  NEGATIVE     Leukocytes, UA NEGATIVE  NEGATIVE  MICROSCOPIC NOT DONE ON URINES WITH NEGATIVE PROTEIN, BLOOD, LEUKOCYTES, NITRITE, OR GLUCOSE <1000 mg/dL.  TSH     Status: Normal   Collection Time   01/15/12  7:55 PM      Component Value Range Comment   TSH 2.036  0.350 - 4.500 (uIU/mL)   T3, FREE     Status: Normal   Collection Time   01/15/12  7:55 PM      Component Value Range Comment   T3, Free 3.7  2.3 - 4.2 (pg/mL)   T4, FREE     Status: Normal   Collection Time   01/15/12  7:55 PM      Component Value Range Comment   Free T4 1.12  0.80 - 1.80 (ng/dL)    Time was spent today discussing with the patient her current symptoms. The patient reports to continuing to sleep well with a good  appetite. She reports improvement in the severity of her depressive symptoms today with mild depression and anxiety reported.  She denies any auditory or visual hallucinations as well as any delusional thinking.  The patient denies any side effects to her medications today and reports she feels she may be ready for discharge in the morning.  Treatment Plan Summary:  1. Daily contact with patient to assess and evaluate symptoms and progress in treatment  2. Medication management  3. The patient will deny suicidal ideations or homicidal ideations for 48 hours prior to discharge and have a depression and anxiety rating of 3 or less. The patient will also deny any auditory or visual hallucinations or delusional thinking.  4. The patient will deny any symptoms of substance withdrawal at time of discharge.   Plan:  1. Will continue current medications. 2. Laboratory studies reviewed.  3. Will continue to monitor.  4. Possible discharge tomorrow to outpatient follow up.  Jovon Streetman 01/16/2012, 4:26 PM

## 2012-01-16 NOTE — Discharge Planning (Signed)
Met with patient in Aftercare Planning Group.   She was very pleasant and cooperative, interacted well throughout group.  She asked whether follow-up had yet been set and continues to appear very invested in the new agency.  She also stated she has decided she wants to go to grief groups and [facility] for supports.  Case Manager gave her information about each of those, as well as a list of support groups which she said would be helpful in case she decided to pursue that route.  Follow-up was set with Breck Coons at [facility] for 01/20/12 at 2pm.  This is to be a diagnostic assessment as well as intake for [facility].  She will then be able to get outpatient therapy as well as medication management.  They have a doctor coming on 4/23 and 4/26, and patient will be seen on one of those days, as the doctor appointment is normally one week after the intake.  Ambrose Mantle, LCSW 01/16/2012, 10:09 AM

## 2012-01-16 NOTE — Progress Notes (Signed)
[facility] Group Notes:  (Counselor/Nursing/MHT/Case Management/Adjunct)  01/16/2012 9:07 AM  Type of Therapy:  Group Therapy  Participation Level:  Minimal  Participation Quality:  Attentive  Affect:  Depressed  Cognitive:  Oriented  Insight:  Limited  Engagement in Group:  Limited  Engagement in Therapy:  Limited  Modes of Intervention:  Support  Summary of Progress/Problems: Patient was attentive but did not want to share her issues in group.   Karon Cotterill, Aram Beecham 01/16/2012, 9:07 AM

## 2012-01-16 NOTE — Progress Notes (Signed)
[facility] Group Notes:  (Counselor/Nursing/MHT/Case Management/Adjunct)  01/16/2012 4:13 PM  Type of Therapy:  Music Therapy  Participation Level:  Active  Participation Quality:  Appropriate  Affect:  Appropriate  Cognitive:  Appropriate  Insight:  Good  Engagement in Group:  Good  Engagement in Therapy:  Good  Modes of Intervention:  Education, Support and music  Summary of Progress/Problems: Patient participated in music relaxation activities. Stated that she uses music at home to get her mind on other things and to relax. Enjoyed Consulting civil engineer and reported images of the beach.   Sarah [name] 01/16/2012, 4:13 PM

## 2012-01-17 MED ORDER — ARIPIPRAZOLE 5 MG PO TABS: 5.0000 mg | ORAL_TABLET | Freq: Every day | ORAL | Status: DC

## 2012-01-17 MED ORDER — HYDROCHLOROTHIAZIDE 25 MG PO TABS: 25.0000 mg | ORAL_TABLET | Freq: Every day | ORAL | Status: DC

## 2012-01-17 MED ORDER — CITALOPRAM HYDROBROMIDE 20 MG PO TABS: 20.0000 mg | ORAL_TABLET | Freq: Every day | ORAL | Status: DC

## 2012-01-17 NOTE — Progress Notes (Signed)
[facility] Case Management Discharge Plan:  Will you be returning to the same living situation after discharge: Yes,  with roommate At discharge, do you have transportation home?:Yes,  with roommate Do you have the ability to pay for your medications:Yes,  insurance and income  Interagency Information:     Release of information consent forms completed and in the chart;  Patient's signature needed at discharge.  Patient to Follow up at:  Follow-up Information    Follow up with Breck Coons for Intake on 01/20/2012. (2pm appointment for intake.  You will then be scheduled for counseling and doctor visit.)    Contact information:   [facility] [address] [place]  [address] Telephone:  [phone]      Call [facility]. (Classes meet Monday-Thursday 10:00-11:30AM)    Contact information:   [facility] [place]. [address] [place], [place] Telephone:  [phone]      Call Hospice. (Call for current schedule of grief counseling groups)    Contact information:   [address] [facility]  Telephone:  [phone]         Patient denies SI/HI:   Yes,      Safety Planning and Suicide Prevention discussed:  Yes,  During Aftercare Planning Group, Case Manager provided psychoeducation on "Suicide Prevention Information."  This included descriptions of risk factors for suicide, warning signs that an individual is in crisis and thinking of suicide, and what to do if this occurs.  Pt indicated understanding of information provided, and will read brochure given upon discharge.   She also participated actively in the discussion, expressed a safey plan.  Barrier to discharge identified:No.  Summary and Recommendations:  Follow up with med management and counseling, go to support groups and [facility].   Sarina Ser 01/17/2012, 11:06 AM

## 2012-01-17 NOTE — Progress Notes (Signed)
Pt hypertensive this AM. 180/113 sitting and 170/121 standing. Pt is otherwise asymptomatic and has no complaints of chest pain, lightheadedness or otherwise. Called place to Serena Colonel, NP to notify her of BP readings. Orders received. Will recheck BP in 45 minutes.

## 2012-01-17 NOTE — [facility] Suicide Risk Assessment (Signed)
Suicide Risk Assessment  Discharge Assessment     Demographic factors:  Low socioeconomic status   Current Mental Status Per Nursing Assessment::   On Admission:   (denies self harm thoughts) At Discharge:  AO x 3.  The patient denies any suicidal or homicidal ideations today.  She also denies any auditory or visual hallucinations.  Current Mental Status Per Physician:  Diagnosis:  Axis I: Schizoaffective Disorder - Depressed Type.  Cannabis Abuse.   The patient was seen today and reports the following:   ADL's: Intact.  Sleep: The patient reports to sleeping well last night without difficulty.  Appetite: The patient reports a good appetite today.   Mild>(1-10) >Severe  Hopelessness (1-10): 0  Depression (1-10): 1  Anxiety (1-10): 0   Suicidal Ideation: The patient adamantly denies any suicidal ideations today.  Plan: No  Intent: No  Means: No   Homicidal Ideation: The patient adamantly denies any homicidal ideations.  Plan: No  Intent: No.  Means: No   General Appearance/Behavior: The patient remained friendly and cooperative today with this Physician.  Eye Contact: Good.  Speech: Appropriate in rate and volume today with no pressuring noted.  Motor Behavior: wnl.  Level of Consciousness: Alert and Oriented x 3.  Mental Status: Alert and Oriented x 3.  Mood: Essentially Euthymic.  Affect: Mildly Constricted.  Anxiety Level: No anxiety reported.  Thought Process: wnl.  Thought Content: The patient denies any auditory or visual hallucinations today as well as any delusional thinking.  Perception:. wnl.  Judgment: Good.  Insight: Good.  Cognition: Oriented to person, place and time.   Loss Factors: Loss of significant relationship;Financial problems / change in socioeconomic status  Historical Factors: Prior suicide attempts  Risk Reduction Factors:   Good Social Support.  Good access to healthcare.  Continued Clinical Symptoms:  Depression:    Anhedonia Previous Psychiatric Diagnoses and Treatments Schizoaffective Disorder - Depressed Type.  Discharge Diagnoses:   AXIS I:   Schizoaffective Disorder - Depressed Type.    Cannabis Abuse.  AXIS II:   None Noted. AXIS III: 1. Hypertension.   AXIS IV:   Chronic Mental Illness.  Recent Death of Grandmother. AXIS V:   GAF at time of admission approximately 40.  GAF at time of discharge approximately 60.  Cognitive Features That Contribute To Risk:  None Noted.   Time was spent today discussing with the patient her current symptoms. The patient reports to continuing to sleep without difficulty as  well as a good appetite. She denies any significant depressive or anxiety symptoms and denies any auditory or visual hallucinations or delusional thinking.  She also adamantly denies any SI/HI.  The patient is asking for discharge today and this will be ordered to outpatient follow up.  Treatment Plan Summary:  1. Daily contact with patient to assess and evaluate symptoms and progress in treatment  2. Medication management  3. The patient will deny suicidal ideations or homicidal ideations for 48 hours prior to discharge and have a depression and anxiety rating of 3 or less. The patient will also deny any auditory or visual hallucinations or delusional thinking.  4. The patient will deny any symptoms of substance withdrawal at time of discharge.   Plan:  1. Will continue current medications.  2. Laboratory studies reviewed.  3. Will continue to monitor.  4. Discharge today at the patient's request to outpatient follow up.  Suicide Risk:  Minimal: No identifiable suicidal ideation.  Patients presenting with no risk factors  but with morbid ruminations; may be classified as minimal risk based on the severity of the depressive symptoms  Plan Of Care/Follow-up recommendations:  Activity:  As tolerated. Diet:  Heart Healthy Diet. Other:  Please take all medications only as directed and keep  all scheduled follow up appointments.  Sarah [name] 01/17/2012, 9:54 AM

## 2012-01-17 NOTE — Progress Notes (Signed)
Currently resting quietly in bed in left lateral position. Respirations are even and unlabored. No acute distress noted. No subjective signs of pain or discomfort. Safety has been maintained with Q15 minute observation. Will continue current POC. 

## 2012-01-17 NOTE — Progress Notes (Signed)
[facility] Group Notes:  (Counselor/Nursing/MHT/Case Management/Adjunct)  01/17/2012 1:25 PM  Type of Therapy:  Music Therapy  Participation Level:  Active  Participation Quality:  Appropriate  Affect:  Appropriate  Cognitive:  Appropriate  Insight:  Good  Engagement in Group:  Good  Engagement in Therapy:  Good  Modes of Intervention:  Education, Problem-solving, Support and Exploration  Summary of Progress/Problems: Patient stated that she is ready for discharge. She will continue her grief work in community support groups. She plans to communicate her feelings more to her roommate and others. Talked about happy memories of grandmother. Also talked about grandmother liking gospel music and she thinks of her on Sundays when she gets ready to go to church.   Levie Wages, Aram Beecham 01/17/2012, 1:25 PM

## 2012-01-17 NOTE — Tx Team (Signed)
Interdisciplinary Treatment Plan Update (Adult)  Date:  01/17/2012  Time Reviewed:  10:15AM-11:00AM  Progress in Treatment: Attending groups:  Yes, on 500 hall Participating in groups:    Yes Taking medication as prescribed:    Yes Tolerating medication:   Yes Family/Significant other contact made:  Yes Patient understands diagnosis:   Yes Discussing patient identified problems/goals with staff:   Yes Medical problems stabilized or resolved:   Yes Denies suicidal/homicidal ideation:  Yes Issues/concerns per patient self-inventory:   None Other:    New problem(s) identified: No, Describe:    Reason for Continuation of Hospitalization: None  Interventions implemented related to continuation of hospitalization:  Medication monitoring and adjustment, safety checks Q15 min., suicide risk assessment, group therapy, psychoeducation, collateral contact, aftercare planning, ongoing physician assessments, medication education - UNTIL DISCHARGE  Additional comments:  Not applicable  Estimated length of stay:  Discharge today   Discharge Plan:  Return home with roommate, follow up with [facility]  New goal(s):  Not applicable  Review of initial/current patient goals per problem list:   1.  Goal(s):  Deny SI for 48 hours prior to D/C.  Met:  Yes  Target date:  By Discharge   As evidenced by:  Has denied several days  2.  Goal(s):  Reduce depression to no greater than 3 at D/C.  Met:  Yes  Target date:  By Discharge   As evidenced by:  "1" today  3.  Goal(s):  Reduce anxiety to no greater than 3 at D/C.  Met:  Yes  Target date:  By Discharge   As evidenced by:  "0" today  4.  Goal(s):  Set up with aftercare providers & support groups.  Met:  Yes  Target date:  By Discharge   As evidenced by:  All is arranged  Attendees: Patient:  Sarah [name]  01/17/2012 10:15AM-11:00AM  Family:     Physician:  Dr. Harvie Heck Readling 01/17/2012 10:15AM-11:00AM  Nursing:    Tacy Learn, RN 01/17/2012 10:15AM -11:00AM   Case Manager:  Ambrose Mantle, LCSW 01/17/2012 10:15AM-11:00AM  Counselor:  Veto Kemps, MT-BC 01/17/2012 10:15AM-11:00AM  Other:   Omelia Blackwater, RN 01/17/2012 10:15AM-11:00AM  Other:      Other:      Other:       Scribe for Treatment Team:   Sarina Ser, 01/17/2012, 10:15AM-11:15AM

## 2012-01-17 NOTE — Progress Notes (Signed)
01/17/2012         Time: 1130      Group Topic/Focus: The focus of the group is on enhancing the patients' ability to cope with stressors by understanding what coping is, why it is important, the negative effects of stress and developing healthier coping skills. Patients practice [place] and discuss how exercise can be used as a healthy coping strategy.  Participation Level: Active  Participation Quality: Attentive  Affect: Appropriate  Cognitive: Oriented   Additional Comments: Patient bright, smiling when talking about discharging today.   Sarah [name] 01/17/2012 1:05 PM

## 2012-01-17 NOTE — Progress Notes (Signed)
[facility] Adult Inpatient Family/Significant Other Suicide Prevention Education  Suicide Prevention Education:  Education Completed; Sarah [name] (roommate) [phone]) has been identified by the patient as the family member/significant other with whom the patient will be residing, and identified as the person(s) who will aid the patient in the event of a mental health crisis (suicidal ideations/suicide attempt).  With written consent from the patient, the family member/significant other has been provided the following suicide prevention education, prior to the and/or following the discharge of the patient.  The suicide prevention education provided includes the following:  Suicide risk factors  Suicide prevention and interventions  National Suicide Hotline telephone number  [facility] assessment telephone number  [facility] Emergency Assistance 911  [facility] and/or Residential Mobile Crisis Unit telephone number  Request made of family/significant other to:  Remove weapons (e.g., guns, rifles, knives), all items previously/currently identified as safety concern.    Remove drugs/medications (over-the-counter, prescriptions, illicit drugs), all items previously/currently identified as a safety concern.  The family member/significant other verbalizes understanding of the suicide prevention education information provided.  The family member/significant other agrees to remove the items of safety concern listed above.  Sarah [name] 01/17/2012, 12:48 PM

## 2012-01-21 NOTE — Progress Notes (Signed)
Patient Discharge Instructions:  Psychiatric Admission Assessment Note Provided,  01/21/2012 Face Sheet Provided, 01/21/2012 Faxed/Sent to the Next Level Care provider:  01/21/2012 Next Level Care Provider Has Access to the EMR, 01/21/2012 Provided Suicide Risk Assessment - Discharge Assessment 01/21/2012  Faxed to [facility] @ [phone]  Wandra Scot, 01/21/2012, 4:31 PM

## 2012-02-03 NOTE — Discharge Summary (Signed)
Physician Discharge Summary Note  Patient:  Sarah [name] is an 36 y.o., female MRN:  [id number] DOB:  [birth year] Patient phone:  [phone] (home)  Patient address:   [address] [place] [address],   Date of Admission:  01/15/2012 Date of Discharge: 01/17/2012  Axis Diagnosis:   AXIS I:  Schizoaffective disorder, Depressed Type; Cannabis Abuse AXIS II:  No Diagnosis AXIS III:  Hypertension Past Medical History  Diagnosis Date  . Hypertension   . Depression   . Anxiety    AXIS IV:  Burden of Chronic Mental Illness; Recent Death of Grandmother AXIS V:  61-70 mild symptoms  Level of Care:  OP  Hospital Course:  Second admission for Kavina who presented by way of our emergency room where she was complaining about one week of auditory hallucinations with commands to kill herself. She has a history of chronic mental illness, being diagnosed with auditory hallucinations and schizoaffective disorder about 5 years ago. She has been followed by [facility] mental health in past, but has not been seeing them recently. She cited recent aggravating factors of grief over the death of her grandmother.  She was admitted for acute stabilization unit and started on Abilify 5 mg each bedtime for auditory hallucinations, and Celexa 20 mg for depression. We continued her home blood pressure medications. She responded well to medications, and had initially rated her depression as 6/10 on a 1-10 scale with 10 being the worst symptoms. By April 12 she was rating her depression a 1/10 and denying any symptoms of anxiety or hopelessness. She was requesting to go home. She was appropriate and pleasant, with peers and staff on our unit, and group therapy was productive.  Consults:  None  Significant Diagnostic Studies:  UDS positiv for cannabis.  Thyroid panel normal  Discharge Vitals:   Blood pressure 141/100, pulse 73, temperature 98.5 F (36.9 C), temperature source Oral, resp. rate 18, height  5\' 7"  (1.702 m), weight 114.76 kg (253 lb), last menstrual period 11/27/2011.  Mental Status Exam: See Mental Status Examination and Suicide Risk Assessment completed by Attending Physician prior to discharge.  Discharge destination:  Home  Is patient on multiple antipsychotic therapies at discharge:  No   Has Patient had three or more failed trials of antipsychotic monotherapy by history:  No  Recommended Plan for Multiple Antipsychotic Therapies: N/A   Medication List  As of 02/03/2012 10:37 AM   TAKE these medications      Indication    ARIPiprazole 5 MG tablet   Commonly known as: ABILIFY   Take 1 tablet (5 mg total) by mouth at bedtime. For psychosis.       citalopram 20 MG tablet   Commonly known as: CELEXA   Take 1 tablet (20 mg total) by mouth daily with breakfast. For depression       hydrochlorothiazide 25 MG tablet   Commonly known as: HYDRODIURIL   Take 1 tablet (25 mg total) by mouth daily. For blood pressure            Follow-up Information    Follow up with Breck Coons for Intake on 01/20/2012. (2pm appointment for intake.  You will then be scheduled for counseling and doctor visit.)    Contact information:   [facility] [address] [place]  [address] Telephone:  [phone]      Call [facility]. (Classes meet Monday-Thursday 10:00-11:30AM)    Contact information:   [facility] [place]. [address] [place], [place] Telephone:  [phone]      Call Hospice. (Call for current schedule of grief counseling groups)    Contact information:   [address] [facility] [place] Telephone:  [phone]         Follow-up recommendations:  Activity:  unrestricted Diet:  regular  Signed: Ja Ohman A 02/03/2012, 10:37 AM

## 2012-10-14 ENCOUNTER — Encounter (HOSPITAL_COMMUNITY): Payer: Self-pay | Admitting: Emergency Medicine

## 2012-10-14 ENCOUNTER — Emergency Department (HOSPITAL_COMMUNITY)
Admission: EM | Admit: 2012-10-14 | Discharge: 2012-10-14 | Disposition: A | Discharge status: Home / Self Care | Payer: Medicaid Other | Attending: Emergency Medicine | Admitting: Emergency Medicine

## 2012-10-14 DIAGNOSIS — N72 Inflammatory disease of cervix uteri: Secondary | ICD-10-CM | POA: Insufficient documentation

## 2012-10-14 DIAGNOSIS — F3289 Other specified depressive episodes: Secondary | ICD-10-CM | POA: Insufficient documentation

## 2012-10-14 DIAGNOSIS — R300 Dysuria: Secondary | ICD-10-CM | POA: Insufficient documentation

## 2012-10-14 DIAGNOSIS — Z79899 Other long term (current) drug therapy: Secondary | ICD-10-CM | POA: Insufficient documentation

## 2012-10-14 DIAGNOSIS — F411 Generalized anxiety disorder: Secondary | ICD-10-CM | POA: Insufficient documentation

## 2012-10-14 DIAGNOSIS — F329 Major depressive disorder, single episode, unspecified: Secondary | ICD-10-CM | POA: Insufficient documentation

## 2012-10-14 DIAGNOSIS — I10 Essential (primary) hypertension: Secondary | ICD-10-CM | POA: Insufficient documentation

## 2012-10-14 DIAGNOSIS — F17200 Nicotine dependence, unspecified, uncomplicated: Secondary | ICD-10-CM | POA: Insufficient documentation

## 2012-10-14 LAB — URINE MICROSCOPIC-ADD ON

## 2012-10-14 LAB — URINALYSIS, ROUTINE W REFLEX MICROSCOPIC
Glucose, UA: NEGATIVE mg/dL
Ketones, ur: NEGATIVE mg/dL
Protein, ur: NEGATIVE mg/dL

## 2012-10-14 LAB — POCT I-STAT, CHEM 8
Glucose, Bld: 82 mg/dL (ref 70–99)
HCT: 41 % (ref 36.0–46.0)
Hemoglobin: 13.9 g/dL (ref 12.0–15.0)
Potassium: 3.4 mEq/L — ABNORMAL LOW (ref 3.5–5.1)
Sodium: 143 mEq/L (ref 135–145)

## 2012-10-14 LAB — WET PREP, GENITAL: Yeast Wet Prep HPF POC: NONE SEEN

## 2012-10-14 MED ORDER — CEFTRIAXONE SODIUM 250 MG IJ SOLR
250.0000 mg | Freq: Once | INTRAMUSCULAR | Status: AC
  Administered 2012-10-14: 250 mg via INTRAMUSCULAR
  Filled 2012-10-14: qty 250

## 2012-10-14 MED ORDER — AZITHROMYCIN 250 MG PO TABS
1000.0000 mg | ORAL_TABLET | Freq: Once | ORAL | Status: AC
  Administered 2012-10-14: 1000 mg via ORAL
  Filled 2012-10-14: qty 4

## 2012-10-14 NOTE — ED Notes (Signed)
Pt does not appear to show signs of allergic reaction to IM injection given. Pt does not appear to be in acute distress upon d/c.

## 2012-10-14 NOTE — ED Notes (Signed)
Pt currently denies nausea. Pt states abdominal pain increases upon urination. Pt mentating appropriately.

## 2012-10-14 NOTE — ED Notes (Signed)
Pt ambulatory leaving ED with significant other. Pt given d/c teaching and follow up care instructions. Pt has no further questions upon d/c teaching. Pt does not appear to be in acute distress upon d/c.

## 2012-10-14 NOTE — ED Provider Notes (Addendum)
History     CSN: [id number]  Arrival date & time 10/14/12  1149   First MD Initiated Contact with Patient 10/14/12 1243      Chief Complaint  Patient presents with  . Abdominal Pain  . Dysuria    (Consider location/radiation/quality/duration/timing/severity/associated sxs/prior treatment) HPI Complains of burning with urination and lower abdominal pain worse with urination onset 2 days ago. Patient reports that she had vomiting and diarrhea 2 days ago which has since resolved. No other complaint no other associated symptoms no treatment prior to coming here. Patient reports she did not take her antihypertensive medications this morning. Has not had any vomiting or diarrhea in 2 days. Last normal menstrual period 10/02/2012 last normal bowel movement last night Past Medical History  Diagnosis Date  . Hypertension   . Depression   . Anxiety     History reviewed. No pertinent past surgical history.  Family History  Problem Relation Age of Onset  . Anesthesia problems Neg Hx   . Malignant hyperthermia Neg Hx   . Pseudochol deficiency Neg Hx   . Hypotension Neg Hx    Hypertension History  Substance Use Topics  . Smoking status: Current Every Day Smoker -- 0.5 packs/day for 4 years    Types: Cigarettes  . Smokeless tobacco: Not on file  . Alcohol Use: No    OB History    Grav Para Term Preterm Abortions TAB SAB Ect Mult Living                  Review of Systems  Constitutional: Negative.   HENT: Negative.   Respiratory: Negative.   Cardiovascular: Negative.   Gastrointestinal: Positive for abdominal pain.  Genitourinary: Positive for dysuria.  Musculoskeletal: Negative.   Skin: Negative.   Neurological: Negative.   Hematological: Negative.   Psychiatric/Behavioral: Negative.     Allergies  Review of patient's allergies indicates no known allergies.  Home Medications   Current Outpatient Rx  Name  Route  Sig  Dispense  Refill  . ARIPIPRAZOLE 5 MG PO  TABS   Oral   Take 5 mg by mouth at bedtime.         Marland Kitchen CITALOPRAM HYDROBROMIDE 20 MG PO TABS   Oral   Take 1 tablet (20 mg total) by mouth daily with breakfast. For depression   30 tablet   0   . HYDROCHLOROTHIAZIDE 25 MG PO TABS   Oral   Take 1 tablet (25 mg total) by mouth daily. For blood pressure           BP 172/102  Pulse 65  Temp 98.2 F (36.8 C) (Oral)  Resp 20  SpO2 97%  LMP 10/02/2012  Physical Exam  Nursing note and vitals reviewed. Constitutional: She appears well-developed and well-nourished.  HENT:  Head: Normocephalic and atraumatic.       hirsuit  Eyes: Conjunctivae normal are normal. Pupils are equal, round, and reactive to light.  Neck: Neck supple. No tracheal deviation present. No thyromegaly present.  Cardiovascular: Normal rate and regular rhythm.   No murmur heard. Pulmonary/Chest: Effort normal and breath sounds normal.  Abdominal: Soft. Bowel sounds are normal. She exhibits no distension. There is no tenderness.       Obese  Genitourinary:       No external lesion. Positive white vaginal discharge. Positive cervical motion tenderness no blood in vault cervical os closed no adnexal masses or tenderness  Musculoskeletal: Normal range of motion. She exhibits no edema and no  tenderness.  Neurological: She is alert. Coordination normal.  Skin: Skin is warm and dry. No rash noted.  Psychiatric: She has a normal mood and affect.    ED Course  Procedures (including critical care time)   Labs Reviewed  URINALYSIS, ROUTINE W REFLEX MICROSCOPIC  PREGNANCY, URINE   No results found.   No diagnosis found.   Results for orders placed during the hospital encounter of 10/14/12  URINALYSIS, ROUTINE W REFLEX MICROSCOPIC      Component Value Range   Color, Urine YELLOW  YELLOW   APPearance CLOUDY (*) CLEAR   Specific Gravity, Urine >1.030 (*) 1.005 - 1.030   pH 5.5  5.0 - 8.0   Glucose, UA NEGATIVE  NEGATIVE mg/dL   Hgb urine dipstick TRACE  (*) NEGATIVE   Bilirubin Urine SMALL (*) NEGATIVE   Ketones, ur NEGATIVE  NEGATIVE mg/dL   Protein, ur NEGATIVE  NEGATIVE mg/dL   Urobilinogen, UA 0.2  0.0 - 1.0 mg/dL   Nitrite NEGATIVE  NEGATIVE   Leukocytes, UA NEGATIVE  NEGATIVE  PREGNANCY, URINE      Component Value Range   Preg Test, Ur NEGATIVE  NEGATIVE  URINE MICROSCOPIC-ADD ON      Component Value Range   Squamous Epithelial / LPF MANY (*) RARE   WBC, UA 0-2  <3 WBC/hpf   RBC / HPF 0-2  <3 RBC/hpf   Bacteria, UA RARE  RARE   Urine-Other MUCOUS PRESENT    POCT I-STAT, CHEM 8      Component Value Range   Sodium 143  135 - 145 mEq/L   Potassium 3.4 (*) 3.5 - 5.1 mEq/L   Chloride 105  96 - 112 mEq/L   BUN 9  6 - 23 mg/dL   Creatinine, Ser 1.61  0.50 - 1.10 mg/dL   Glucose, Bld 82  70 - 99 mg/dL   Calcium, Ion 0.96  0.45 - 1.23 mmol/L   TCO2 28  0 - 100 mmol/L   Hemoglobin 13.9  12.0 - 15.0 g/dL   HCT 40.9  81.1 - 91.4 %  WET PREP, GENITAL      Component Value Range   Yeast Wet Prep HPF POC NONE SEEN  NONE SEEN   Trich, Wet Prep NONE SEEN  NONE SEEN   Clue Cells Wet Prep HPF POC FEW (*) NONE SEEN   WBC, Wet Prep HPF POC FEW (*) NONE SEEN   No results found.  MDM  Patient has symptoms of urethritis Also treated for cervicitis Safe sex encouraged She is instructed to take her blood pressure medicine upon arrival home today as she hasn't taken it today Pressure recheck within the next one or 2 weeks Diagnosis #1 cervicitis 2 hypertension        Doug Sou, MD 10/14/12 1559  Doug Sou, MD 10/14/12 2101

## 2012-10-14 NOTE — ED Notes (Signed)
Pt c/o lower abdominal pain and pain upon urination that started yesterday. Pt states vomited last night and had diarrhea Monday; pt denies diarrhea currently. Pt states LMP 10/02/12.

## 2012-10-15 LAB — GC/CHLAMYDIA PROBE AMP: CT Probe RNA: NEGATIVE

## 2012-12-15 ENCOUNTER — Emergency Department (HOSPITAL_COMMUNITY): Payer: Medicaid Other

## 2012-12-15 ENCOUNTER — Emergency Department (HOSPITAL_COMMUNITY)
Admission: EM | Admit: 2012-12-15 | Discharge: 2012-12-15 | Disposition: A | Discharge status: Home / Self Care | Payer: Medicaid Other | Attending: Emergency Medicine | Admitting: Emergency Medicine

## 2012-12-15 ENCOUNTER — Encounter (HOSPITAL_COMMUNITY): Payer: Self-pay | Admitting: Emergency Medicine

## 2012-12-15 DIAGNOSIS — R071 Chest pain on breathing: Secondary | ICD-10-CM | POA: Insufficient documentation

## 2012-12-15 DIAGNOSIS — R42 Dizziness and giddiness: Secondary | ICD-10-CM | POA: Insufficient documentation

## 2012-12-15 DIAGNOSIS — F3289 Other specified depressive episodes: Secondary | ICD-10-CM | POA: Insufficient documentation

## 2012-12-15 DIAGNOSIS — F411 Generalized anxiety disorder: Secondary | ICD-10-CM | POA: Insufficient documentation

## 2012-12-15 DIAGNOSIS — R0789 Other chest pain: Secondary | ICD-10-CM

## 2012-12-15 DIAGNOSIS — R55 Syncope and collapse: Secondary | ICD-10-CM | POA: Insufficient documentation

## 2012-12-15 DIAGNOSIS — F329 Major depressive disorder, single episode, unspecified: Secondary | ICD-10-CM | POA: Insufficient documentation

## 2012-12-15 DIAGNOSIS — W19XXXA Unspecified fall, initial encounter: Secondary | ICD-10-CM | POA: Insufficient documentation

## 2012-12-15 DIAGNOSIS — F17200 Nicotine dependence, unspecified, uncomplicated: Secondary | ICD-10-CM | POA: Insufficient documentation

## 2012-12-15 DIAGNOSIS — R51 Headache: Secondary | ICD-10-CM

## 2012-12-15 DIAGNOSIS — Z79899 Other long term (current) drug therapy: Secondary | ICD-10-CM | POA: Insufficient documentation

## 2012-12-15 DIAGNOSIS — Y9389 Activity, other specified: Secondary | ICD-10-CM | POA: Insufficient documentation

## 2012-12-15 DIAGNOSIS — Y9289 Other specified places as the place of occurrence of the external cause: Secondary | ICD-10-CM | POA: Insufficient documentation

## 2012-12-15 DIAGNOSIS — I10 Essential (primary) hypertension: Secondary | ICD-10-CM | POA: Insufficient documentation

## 2012-12-15 DIAGNOSIS — I951 Orthostatic hypotension: Secondary | ICD-10-CM | POA: Insufficient documentation

## 2012-12-15 DIAGNOSIS — S0990XA Unspecified injury of head, initial encounter: Secondary | ICD-10-CM | POA: Insufficient documentation

## 2012-12-15 LAB — HEPATIC FUNCTION PANEL
ALT: 10 U/L (ref 0–35)
AST: 15 U/L (ref 0–37)
Albumin: 4 g/dL (ref 3.5–5.2)
Alkaline Phosphatase: 84 U/L (ref 39–117)
Total Bilirubin: 0.8 mg/dL (ref 0.3–1.2)
Total Protein: 7.8 g/dL (ref 6.0–8.3)

## 2012-12-15 LAB — CBC WITH DIFFERENTIAL/PLATELET
Eosinophils Absolute: 0.2 10*3/uL (ref 0.0–0.7)
Hemoglobin: 13.2 g/dL (ref 12.0–15.0)
Lymphocytes Relative: 47 % — ABNORMAL HIGH (ref 12–46)
Lymphs Abs: 4.1 10*3/uL — ABNORMAL HIGH (ref 0.7–4.0)
MCH: 29.7 pg (ref 26.0–34.0)
Monocytes Relative: 11 % (ref 3–12)
Neutro Abs: 3.5 10*3/uL (ref 1.7–7.7)
Neutrophils Relative %: 40 % — ABNORMAL LOW (ref 43–77)
Platelets: 380 10*3/uL (ref 150–400)
RBC: 4.44 MIL/uL (ref 3.87–5.11)
WBC: 8.7 10*3/uL (ref 4.0–10.5)

## 2012-12-15 LAB — POCT I-STAT, CHEM 8
BUN: 16 mg/dL (ref 6–23)
Calcium, Ion: 1.18 mmol/L (ref 1.12–1.23)
Chloride: 104 mEq/L (ref 96–112)
Creatinine, Ser: 0.7 mg/dL (ref 0.50–1.10)
TCO2: 31 mmol/L (ref 0–100)

## 2012-12-15 LAB — POCT I-STAT TROPONIN I: Troponin i, poc: 0 ng/mL (ref 0.00–0.08)

## 2012-12-15 LAB — URINE MICROSCOPIC-ADD ON

## 2012-12-15 LAB — URINALYSIS, ROUTINE W REFLEX MICROSCOPIC
Bilirubin Urine: NEGATIVE
Glucose, UA: NEGATIVE mg/dL
Hgb urine dipstick: NEGATIVE
Ketones, ur: NEGATIVE mg/dL
Protein, ur: NEGATIVE mg/dL
pH: 7 (ref 5.0–8.0)

## 2012-12-15 LAB — POCT PREGNANCY, URINE: Preg Test, Ur: NEGATIVE

## 2012-12-15 MED ORDER — ACETAMINOPHEN 500 MG PO TABS: 1000.0000 mg | ORAL_TABLET | Freq: Once | ORAL | Status: DC

## 2012-12-15 MED ORDER — NAPROXEN 500 MG PO TABS: 500.0000 mg | ORAL_TABLET | Freq: Two times a day (BID) | ORAL | Status: DC

## 2012-12-15 MED ORDER — ACETAMINOPHEN 325 MG PO TABS
975.0000 mg | ORAL_TABLET | Freq: Once | ORAL | Status: AC
  Administered 2012-12-15: 975 mg via ORAL
  Filled 2012-12-15: qty 3

## 2012-12-15 NOTE — ED Provider Notes (Signed)
History     CSN: [id number]  Arrival date & time 12/15/12  4098   First MD Initiated Contact with Patient 12/15/12 [phone]      Chief Complaint  Patient presents with  . Flank Pain    (Consider location/radiation/quality/duration/timing/severity/associated sxs/prior treatment) HPI Comments: Pt has several complaints:  1.  Chest Pains - pains across chest - started this AM - then had a syncope episode when she went to the bathroom - it is a sharp pain, she has had this persistently and is worse with taking a deep breath.  Has never had this pain before.  2.  Syncope - got out of bed to use the bathroom - was feeling hot and sweaty, urinated and when she stood up she felt dizzy and then vision went black and she fell to the ground.  BF came into the room and helped her up off the floor.  Still feels a bit dizzy.  Has had lots to drink - drinks lots of sodas.  Not exercising.  This was acute in onset, persistent headache after fall, no focal neuro defecits and was able to walk into the ED.  3.  RUQ / R lower chest pains - worse with moving and with walking to the bathroom this AM - had some improvement with laying down.  ROS + for HA but denies f/c/n/v/d/abd pain.      The history is provided by the patient.    Past Medical History  Diagnosis Date  . Hypertension   . Depression   . Anxiety     History reviewed. No pertinent past surgical history.  Family History  Problem Relation Age of Onset  . Anesthesia problems Neg Hx   . Malignant hyperthermia Neg Hx   . Pseudochol deficiency Neg Hx   . Hypotension Neg Hx     History  Substance Use Topics  . Smoking status: Current Every Day Smoker -- 0.50 packs/day for 4 years    Types: Cigarettes  . Smokeless tobacco: Not on file  . Alcohol Use: No    OB History   Grav Para Term Preterm Abortions TAB SAB Ect Mult Living                  Review of Systems  All other systems reviewed and are negative.    Allergies   Review of patient's allergies indicates no known allergies.  Home Medications   Current Outpatient Rx  Name  Route  Sig  Dispense  Refill  . ARIPiprazole (ABILIFY) 5 MG tablet   Oral   Take 5 mg by mouth daily.          . citalopram (CELEXA) 20 MG tablet   Oral   Take 1 tablet (20 mg total) by mouth daily with breakfast. For depression   30 tablet   0   . hydrochlorothiazide (HYDRODIURIL) 25 MG tablet   Oral   Take 1 tablet (25 mg total) by mouth daily. For blood pressure         . naproxen (NAPROSYN) 500 MG tablet   Oral   Take 1 tablet (500 mg total) by mouth 2 (two) times daily with a meal.   30 tablet   0     BP 134/99  Pulse 75  Temp(Src) 97.9 F (36.6 C) (Oral)  Resp 18  Ht 5\' 7"  (1.702 m)  Wt 250 lb (113.399 kg)  BMI 39.15 kg/m2  SpO2 100%  LMP 11/10/2012  Physical Exam  Nursing  note and vitals reviewed. Constitutional: She appears well-developed and well-nourished. No distress.  HENT:  Head: Normocephalic and atraumatic.  Mouth/Throat: Oropharynx is clear and moist. No oropharyngeal exudate.  Meters membranes mildly dehydrated. No signs of facial trauma, mild tenderness to the posterior occiput without signs of hematoma laceration or contusion  Eyes: Conjunctivae and EOM are normal. Pupils are equal, round, and reactive to light. Right eye exhibits no discharge. Left eye exhibits no discharge. No scleral icterus.  No anisocoria, normal extraocular movements  Neck: Normal range of motion. Neck supple. No JVD present. No thyromegaly present.  Normal range of motion of the neck, no tenderness over the cervical spine  Cardiovascular: Normal rate, regular rhythm, normal heart sounds and intact distal pulses.  Exam reveals no gallop and no friction rub.   No murmur heard. Pulmonary/Chest: Effort normal and breath sounds normal. No respiratory distress. She has no wheezes. She has no rales. She exhibits tenderness ( Reproducible tenderness to the mid upper  chest when palpated, the patient states this is the same pain).  Abdominal: Soft. Bowel sounds are normal. She exhibits no distension and no mass. There is tenderness ( Mild right upper quadrant tenderness, no guarding).  Musculoskeletal: Normal range of motion. She exhibits no edema and no tenderness.  Normal range of motion of all 4 extremities, raising the right arm causes some reproducible chest pain. No tenderness over the ribs on the right side  Lymphadenopathy:    She has no cervical adenopathy.  Neurological: She is alert. Coordination normal.  Normal gait, normal speech, normal movements without ataxia, cranial nerves III through XII intact, normal strength of all 4 extremities  Skin: Skin is warm and dry. No rash noted. No erythema.  Psychiatric: She has a normal mood and affect. Her behavior is normal.    ED Course  Procedures (including critical care time)  Labs Reviewed  CBC WITH DIFFERENTIAL - Abnormal; Notable for the following:    Neutrophils Relative 40 (*)    Lymphocytes Relative 47 (*)    Lymphs Abs 4.1 (*)    All other components within normal limits  URINALYSIS, ROUTINE W REFLEX MICROSCOPIC - Abnormal; Notable for the following:    APPearance CLOUDY (*)    Leukocytes, UA TRACE (*)    All other components within normal limits  URINE MICROSCOPIC-ADD ON - Abnormal; Notable for the following:    Squamous Epithelial / LPF MANY (*)    Bacteria, UA MANY (*)    All other components within normal limits  URINE CULTURE  HEPATIC FUNCTION PANEL  POCT I-STAT, CHEM 8  POCT PREGNANCY, URINE  POCT I-STAT TROPONIN I   Dg Chest 2 View  12/15/2012  *RADIOLOGY REPORT*  Clinical Data: Chest pain  CHEST - 2 VIEW  Comparison: None.  Findings:  Lungs clear.  Heart size and pulmonary vascularity are normal.  No adenopathy.  No pneumothorax.  No bone lesions.  IMPRESSION: No abnormality noted.   Original Report Authenticated By: Bretta Bang, M.D.    Ct Head Wo  Contrast  12/15/2012  *RADIOLOGY REPORT*  Clinical Data: Severe headache, syncope, fall  CT HEAD WITHOUT CONTRAST  Technique:  Contiguous axial images were obtained from the base of the skull through the vertex without contrast.  Comparison: None.  Findings: No evidence of parenchymal hemorrhage or extra-axial fluid collection. No mass lesion, mass effect, or midline shift.  No CT evidence of acute infarction.  Cerebral volume is age appropriate.  No ventriculomegaly.  Apparent empty sella (  series 2/image 4).  The visualized paranasal sinuses are essentially clear. The mastoid air cells are unopacified.  No evidence of calvarial fracture.  IMPRESSION: No evidence of acute intracranial abnormality.  Apparent empty sella.  In the appropriate clinical setting in a young patient, this can be associated with pseudotumor cerebri.  If there is continued clinical concern, consider nonemergent pituitary-protocol enhanced MRI as clinically warranted.   Original Report Authenticated By: Charline Bills, M.D.      1. Headache   2. Orthostasis   3. Syncope   4. Chest wall pain       MDM  The patient has had a syncopal episode with minor head injury, she also has some chest pain which appears reproducible and some right upper quadrant pain. She reports a history of having some elevated bilirubin in the past and at this time has mild tenderness in the right quadrant, she will need further evaluation with labs and a CT scan of the head. EKG pending  ED ECG REPORT  I personally interpreted this EKG   Date: 12/15/2012   Rate: 57  Rhythm: sinus bradycardia  QRS Axis: normal, to indeterminate  Intervals: PR prolonged  ST/T Wave abnormalities: Diffuse mild ST elevation  Conduction Disutrbances:first-degree A-V block   Narrative Interpretation:   Old EKG Reviewed: Compared with 01/14/2012, no changes are seen, persistent first degree AV block, consistent indeterminate axis, consistent ST abnormalities  Patient  reevaluated, has normal exam again, vital signs have improved and she does have some chills when she stands with an increase in her pulse and a decrease in her blood pressure. She has been taking oral fluids, Tylenol given, EKG nonischemic, patient stable for discharge, resource list given.       Vida Roller, MD 12/15/12 1003

## 2012-12-15 NOTE — ED Notes (Signed)
Pt to CT and XR

## 2012-12-15 NOTE — ED Notes (Addendum)
Pt here with multiple c/o: HA, bilateral flank pain, CP, near syncopal event. Pt reports flank/side pain started yesterday evening and continued through the night, at 0530 this AM pt states she became diaphoretic in the bathroom and had an unwitnessed syncopal event. Pt lying on stretcher NAD, laughing and smiling with staff, reports pain at a 10.

## 2012-12-15 NOTE — ED Notes (Addendum)
Pt in gown, talking on phone, NAD noted

## 2012-12-15 NOTE — ED Notes (Signed)
NAD noted at time of d/c home 

## 2012-12-16 LAB — URINE CULTURE

## 2014-04-29 ENCOUNTER — Emergency Department (HOSPITAL_COMMUNITY)
Admission: EM | Admit: 2014-04-29 | Discharge: 2014-04-29 | Disposition: A | Discharge status: Home / Self Care | Payer: Medicaid Other | Attending: Emergency Medicine | Admitting: Emergency Medicine

## 2014-04-29 ENCOUNTER — Encounter (HOSPITAL_COMMUNITY): Payer: Self-pay | Admitting: Emergency Medicine

## 2014-04-29 DIAGNOSIS — R519 Headache, unspecified: Secondary | ICD-10-CM

## 2014-04-29 DIAGNOSIS — F17200 Nicotine dependence, unspecified, uncomplicated: Secondary | ICD-10-CM | POA: Insufficient documentation

## 2014-04-29 DIAGNOSIS — Z3202 Encounter for pregnancy test, result negative: Secondary | ICD-10-CM | POA: Insufficient documentation

## 2014-04-29 DIAGNOSIS — I10 Essential (primary) hypertension: Secondary | ICD-10-CM | POA: Insufficient documentation

## 2014-04-29 DIAGNOSIS — Z79899 Other long term (current) drug therapy: Secondary | ICD-10-CM | POA: Insufficient documentation

## 2014-04-29 DIAGNOSIS — Z8659 Personal history of other mental and behavioral disorders: Secondary | ICD-10-CM | POA: Insufficient documentation

## 2014-04-29 DIAGNOSIS — R51 Headache: Secondary | ICD-10-CM | POA: Insufficient documentation

## 2014-04-29 DIAGNOSIS — R42 Dizziness and giddiness: Secondary | ICD-10-CM | POA: Insufficient documentation

## 2014-04-29 LAB — I-STAT CHEM 8, ED
BUN: 14 mg/dL (ref 6–23)
CALCIUM ION: 1.18 mmol/L (ref 1.12–1.23)
CREATININE: 0.9 mg/dL (ref 0.50–1.10)
Chloride: 102 mEq/L (ref 96–112)
GLUCOSE: 96 mg/dL (ref 70–99)
HCT: 42 % (ref 36.0–46.0)
Hemoglobin: 14.3 g/dL (ref 12.0–15.0)
Potassium: 3.4 mEq/L — ABNORMAL LOW (ref 3.7–5.3)
Sodium: 141 mEq/L (ref 137–147)
TCO2: 25 mmol/L (ref 0–100)

## 2014-04-29 LAB — PREGNANCY, URINE: PREG TEST UR: NEGATIVE

## 2014-04-29 MED ORDER — SODIUM CHLORIDE 0.9 % IV BOLUS (SEPSIS)
1000.0000 mL | Freq: Once | INTRAVENOUS | Status: AC
  Administered 2014-04-29: 1000 mL via INTRAVENOUS

## 2014-04-29 MED ORDER — METOCLOPRAMIDE HCL 5 MG/ML IJ SOLN
10.0000 mg | Freq: Once | INTRAMUSCULAR | Status: AC
  Administered 2014-04-29: 10 mg via INTRAVENOUS
  Filled 2014-04-29: qty 2

## 2014-04-29 MED ORDER — KETOROLAC TROMETHAMINE 30 MG/ML IJ SOLN
30.0000 mg | Freq: Once | INTRAMUSCULAR | Status: AC
  Administered 2014-04-29: 30 mg via INTRAVENOUS
  Filled 2014-04-29: qty 1

## 2014-04-29 MED ORDER — DIPHENHYDRAMINE HCL 50 MG/ML IJ SOLN
25.0000 mg | Freq: Once | INTRAMUSCULAR | Status: AC
  Administered 2014-04-29: 25 mg via INTRAVENOUS
  Filled 2014-04-29: qty 1

## 2014-04-29 MED ORDER — HYDROCHLOROTHIAZIDE 25 MG PO TABS
25.0000 mg | ORAL_TABLET | Freq: Every day | ORAL | Status: DC
  Administered 2014-04-29: 25 mg via ORAL
  Filled 2014-04-29: qty 1

## 2014-04-29 NOTE — ED Provider Notes (Signed)
Medical screening examination/treatment/procedure(s) were conducted as a shared visit with non-physician practitioner(s) and myself.  I personally evaluated the patient during the encounter.  Pt c/o high bp, also notes recent gradual onset frontal headache, constant, persistent. No eye pain or change in vision. No numbness/weakness. No problems w balance or normal functional ability. No fevers or neck pain/stiffness. No sinus or temporal tenderness, neck supple.    Mirna Mires, MD 04/29/14 2051

## 2014-04-29 NOTE — ED Provider Notes (Signed)
CSN: [id number]     Arrival date & time 04/29/14  1435 History   First MD Initiated Contact with Patient 04/29/14 1519     Chief Complaint  Patient presents with  . Headache  . Hypertension  . Female GU Problem    irregular menses     (Consider location/radiation/quality/duration/timing/severity/associated sxs/prior Treatment) HPI Comments: Patient presents emergency department with chief complaint of hypertension, and headache. She states that her blood pressure has been running high for the past several days. She states that she normally takes hydrochlorothiazide, but has quit taking them because she says doesn't work. She complains of a headache that started 2 days ago. She also reports intermittent dizziness. She denies any fevers, chills, chest pain, shortness of breath, abdominal pain, vaginal discharge, or bleeding. She states that his chance she could be pregnant, and wishes to investigate this as well. She denies any changes in her vision. Denies feeling dizzy now. She does not have a primary care provider. There no aggravating or alleviating factors.  The history is provided by the patient. No language interpreter was used.    Past Medical History  Diagnosis Date  . Hypertension   . Depression   . Anxiety    History reviewed. No pertinent past surgical history. Family History  Problem Relation Age of Onset  . Anesthesia problems Neg Hx   . Malignant hyperthermia Neg Hx   . Pseudochol deficiency Neg Hx   . Hypotension Neg Hx    History  Substance Use Topics  . Smoking status: Current Every Day Smoker -- 0.50 packs/day for 4 years    Types: Cigarettes  . Smokeless tobacco: Not on file  . Alcohol Use: No   OB History   Grav Para Term Preterm Abortions TAB SAB Ect Mult Living                 Review of Systems  Constitutional: Negative for fever and chills.  Respiratory: Negative for shortness of breath.   Cardiovascular: Negative for chest pain.   Gastrointestinal: Negative for nausea, vomiting, diarrhea and constipation.  Genitourinary: Negative for dysuria.  Neurological: Positive for dizziness and headaches. Negative for weakness and numbness.  All other systems reviewed and are negative.     Allergies  Review of patient's allergies indicates no known allergies.  Home Medications   Prior to Admission medications   Medication Sig Start Date End Date Taking? Authorizing Provider  hydrochlorothiazide (HYDRODIURIL) 25 MG tablet Take 1 tablet (25 mg total) by mouth daily. For blood pressure 01/17/12  Yes Greig Castilla, FNP   BP 183/109  Pulse 83  Temp(Src) 98.3 F (36.8 C) (Oral)  Resp 18  Ht 5\' 7"  (1.702 m)  Wt 273 lb (123.832 kg)  BMI 42.75 kg/m2  SpO2 100%  LMP 01/24/2014 Physical Exam  Nursing note and vitals reviewed. Constitutional: She is oriented to person, place, and time. She appears well-developed and well-nourished.  HENT:  Head: Normocephalic and atraumatic.  Right Ear: External ear normal.  Left Ear: External ear normal.  Eyes: Conjunctivae and EOM are normal. Pupils are equal, round, and reactive to light.  Neck: Normal range of motion. Neck supple.  No pain with neck flexion, no meningismus  Cardiovascular: Normal rate, regular rhythm and normal heart sounds.  Exam reveals no gallop and no friction rub.   No murmur heard. Pulmonary/Chest: Effort normal and breath sounds normal. No respiratory distress. She has no wheezes. She has no rales. She exhibits no tenderness.  Abdominal:  Soft. Bowel sounds are normal. She exhibits no distension and no mass. There is no tenderness. There is no rebound and no guarding.  Musculoskeletal: Normal range of motion. She exhibits no edema and no tenderness.  Normal gait.  Neurological: She is alert and oriented to person, place, and time. She has normal reflexes.  CN 3-12 intact, normal finger to nose, no pronator drift, sensation and strength intact bilaterally.   Skin: Skin is warm and dry.  Psychiatric: She has a normal mood and affect. Her behavior is normal. Judgment and thought content normal.    ED Course  Procedures (including critical care time) Results for orders placed during the hospital encounter of 04/29/14  PREGNANCY, URINE      Result Value Ref Range   Preg Test, Ur NEGATIVE  NEGATIVE  I-STAT CHEM 8, ED      Result Value Ref Range   Sodium 141  137 - 147 mEq/L   Potassium 3.4 (*) 3.7 - 5.3 mEq/L   Chloride 102  96 - 112 mEq/L   BUN 14  6 - 23 mg/dL   Creatinine, Ser 0.90  0.50 - 1.10 mg/dL   Glucose, Bld 96  70 - 99 mg/dL   Calcium, Ion 1.18  1.12 - 1.23 mmol/L   TCO2 25  0 - 100 mmol/L   Hemoglobin 14.3  12.0 - 15.0 g/dL   HCT 42.0  36.0 - 46.0 %      Imaging Review No results found.   EKG Interpretation None      MDM   Final diagnoses:  Essential hypertension  Headache, unspecified headache type    The patient with headache, and hypertension. Patient has not been taking her blood pressure medicine for the past several days, because she says does not work. I will give her a dose of her medication here, as well as treat her headache. Will check electrolytes. Anticipate discharge to home, and will help the patient in establishing a new primary care provider.  4:02 PM Pregnancy test negative. Will give migraine cocktail, and will reassess.  5:27 PM Is feeling significantly better with migraine cocktail. Blood pressure has improved some in the ED. Advised patient to continue taking her blood pressure medication. Have also help her establish primary care in the community. Mariann Laster case management was in this effort.  Patient seen by and discussed with Dr. Ashok Cordia, who agrees that the patient can be discharged to home.    Montine Circle, PA-C 04/29/14 1729

## 2014-04-29 NOTE — Discharge Instructions (Signed)
You need to continue taking your blood pressure medications.  General Headache Without Cause A headache is pain or discomfort felt around the head or neck area. The specific cause of a headache may not be found. There are many causes and types of headaches. A few common ones are:  Tension headaches.  Migraine headaches.  Cluster headaches.  Chronic daily headaches. HOME CARE INSTRUCTIONS   Keep all follow-up appointments with your caregiver or any specialist referral.  Only take over-the-counter or prescription medicines for pain or discomfort as directed by your caregiver.  Lie down in a dark, quiet room when you have a headache.  Keep a headache journal to find out what may trigger your migraine headaches. For example, write down:  What you eat and drink.  How much sleep you get.  Any change to your diet or medicines.  Try massage or other relaxation techniques.  Put ice packs or heat on the head and neck. Use these 3 to 4 times per day for 15 to 20 minutes each time, or as needed.  Limit stress.  Sit up straight, and do not tense your muscles.  Quit smoking if you smoke.  Limit alcohol use.  Decrease the amount of caffeine you drink, or stop drinking caffeine.  Eat and sleep on a regular schedule.  Get 7 to 9 hours of sleep, or as recommended by your caregiver.  Keep lights dim if bright lights bother you and make your headaches worse. SEEK MEDICAL CARE IF:   You have problems with the medicines you were prescribed.  Your medicines are not working.  You have a change from the usual headache.  You have nausea or vomiting. SEEK IMMEDIATE MEDICAL CARE IF:   Your headache becomes severe.  You have a fever.  You have a stiff neck.  You have loss of vision.  You have muscular weakness or loss of muscle control.  You start losing your balance or have trouble walking.  You feel faint or pass out.  You have severe symptoms that are different from your  first symptoms. MAKE SURE YOU:   Understand these instructions.  Will watch your condition.  Will get help right away if you are not doing well or get worse. Document Released: 09/23/2005 Document Revised: 12/16/2011 Document Reviewed: 10/09/2011 [facility] Patient Information 2015 [place], [place]. This information is not intended to replace advice given to you by your health care provider. Make sure you discuss any questions you have with your health care provider. Hypertension Hypertension, commonly called high blood pressure, is when the force of blood pumping through your arteries is too strong. Your arteries are the blood vessels that carry blood from your heart throughout your body. A blood pressure reading consists of a higher number over a lower number, such as 110/72. The higher number (systolic) is the pressure inside your arteries when your heart pumps. The lower number (diastolic) is the pressure inside your arteries when your heart relaxes. Ideally you want your blood pressure below 120/80. Hypertension forces your heart to work harder to pump blood. Your arteries may become narrow or stiff. Having hypertension puts you at risk for heart disease, stroke, and other problems.  RISK FACTORS Some risk factors for high blood pressure are controllable. Others are not.  Risk factors you cannot control include:   Race. You may be at higher risk if you are African American.  Age. Risk increases with age.  Gender. Men are at higher risk than women before age 56  years. After age 73, women are at higher risk than men. Risk factors you can control include:  Not getting enough exercise or physical activity.  Being overweight.  Getting too much fat, sugar, calories, or salt in your diet.  Drinking too much alcohol. SIGNS AND SYMPTOMS Hypertension does not usually cause signs or symptoms. Extremely high blood pressure (hypertensive crisis) may cause headache, anxiety, shortness of breath,  and nosebleed. DIAGNOSIS  To check if you have hypertension, your health care provider will measure your blood pressure while you are seated, with your arm held at the level of your heart. It should be measured at least twice using the same arm. Certain conditions can cause a difference in blood pressure between your right and left arms. A blood pressure reading that is higher than normal on one occasion does not mean that you need treatment. If one blood pressure reading is high, ask your health care provider about having it checked again. TREATMENT  Treating high blood pressure includes making lifestyle changes and possibly taking medicine. Living a healthy lifestyle can help lower high blood pressure. You may need to change some of your habits. Lifestyle changes may include:  Following the DASH diet. This diet is high in fruits, vegetables, and whole grains. It is low in salt, red meat, and added sugars.  Getting at least 2 hours of brisk physical activity every week.  Losing weight if necessary.  Not smoking.  Limiting alcoholic beverages.  Learning ways to reduce stress. If lifestyle changes are not enough to get your blood pressure under control, your health care provider may prescribe medicine. You may need to take more than one. Work closely with your health care provider to understand the risks and benefits. HOME CARE INSTRUCTIONS  Have your blood pressure rechecked as directed by your health care provider.   Take medicines only as directed by your health care provider. Follow the directions carefully. Blood pressure medicines must be taken as prescribed. The medicine does not work as well when you skip doses. Skipping doses also puts you at risk for problems.   Do not smoke.   Monitor your blood pressure at home as directed by your health care provider. SEEK MEDICAL CARE IF:   You think you are having a reaction to medicines taken.  You have recurrent headaches or feel  dizzy.  You have swelling in your ankles.  You have trouble with your vision. SEEK IMMEDIATE MEDICAL CARE IF:  You develop a severe headache or confusion.  You have unusual weakness, numbness, or feel faint.  You have severe chest or abdominal pain.  You vomit repeatedly.  You have trouble breathing. MAKE SURE YOU:   Understand these instructions.  Will watch your condition.  Will get help right away if you are not doing well or get worse. Document Released: 09/23/2005 Document Revised: 02/07/2014 Document Reviewed: 07/16/2013 [facility] Patient Information 2015 [place], [place]. This information is not intended to replace advice given to you by your health care provider. Make sure you discuss any questions you have with your health care provider.

## 2014-04-29 NOTE — ED Notes (Addendum)
Pt here for high BP. States she has been taking her BP meds but they may not be working. Only takes one med for her BP currently. Reports a HA also the last two days with the high BP. Pt also reports her last period was in late April. Does not have hx of irregular periods.

## 2015-05-26 ENCOUNTER — Encounter (HOSPITAL_COMMUNITY): Payer: Self-pay | Admitting: Emergency Medicine

## 2015-05-26 ENCOUNTER — Emergency Department (HOSPITAL_COMMUNITY)
Admission: EM | Admit: 2015-05-26 | Discharge: 2015-05-26 | Disposition: A | Discharge status: Home / Self Care | Payer: Medicaid Other | Attending: Emergency Medicine | Admitting: Emergency Medicine

## 2015-05-26 ENCOUNTER — Emergency Department (HOSPITAL_COMMUNITY): Payer: Medicaid Other

## 2015-05-26 DIAGNOSIS — Y998 Other external cause status: Secondary | ICD-10-CM | POA: Insufficient documentation

## 2015-05-26 DIAGNOSIS — Z8659 Personal history of other mental and behavioral disorders: Secondary | ICD-10-CM | POA: Insufficient documentation

## 2015-05-26 DIAGNOSIS — S199XXA Unspecified injury of neck, initial encounter: Secondary | ICD-10-CM | POA: Diagnosis not present

## 2015-05-26 DIAGNOSIS — I10 Essential (primary) hypertension: Secondary | ICD-10-CM | POA: Diagnosis not present

## 2015-05-26 DIAGNOSIS — M25571 Pain in right ankle and joints of right foot: Secondary | ICD-10-CM

## 2015-05-26 DIAGNOSIS — M25561 Pain in right knee: Secondary | ICD-10-CM

## 2015-05-26 DIAGNOSIS — S99911A Unspecified injury of right ankle, initial encounter: Secondary | ICD-10-CM | POA: Diagnosis not present

## 2015-05-26 DIAGNOSIS — Z720 Tobacco use: Secondary | ICD-10-CM | POA: Insufficient documentation

## 2015-05-26 DIAGNOSIS — M542 Cervicalgia: Secondary | ICD-10-CM

## 2015-05-26 DIAGNOSIS — Y9389 Activity, other specified: Secondary | ICD-10-CM | POA: Diagnosis not present

## 2015-05-26 DIAGNOSIS — Z79899 Other long term (current) drug therapy: Secondary | ICD-10-CM | POA: Insufficient documentation

## 2015-05-26 DIAGNOSIS — G44319 Acute post-traumatic headache, not intractable: Secondary | ICD-10-CM

## 2015-05-26 DIAGNOSIS — S8991XA Unspecified injury of right lower leg, initial encounter: Secondary | ICD-10-CM | POA: Insufficient documentation

## 2015-05-26 DIAGNOSIS — S0990XA Unspecified injury of head, initial encounter: Secondary | ICD-10-CM | POA: Insufficient documentation

## 2015-05-26 DIAGNOSIS — Y92410 Unspecified street and highway as the place of occurrence of the external cause: Secondary | ICD-10-CM | POA: Diagnosis not present

## 2015-05-26 MED ORDER — METHOCARBAMOL 500 MG PO TABS: 500.0000 mg | ORAL_TABLET | Freq: Two times a day (BID) | ORAL | Status: DC

## 2015-05-26 MED ORDER — HYDROCHLOROTHIAZIDE 25 MG PO TABS: 25.0000 mg | ORAL_TABLET | Freq: Every day | ORAL | Status: DC

## 2015-05-26 MED ORDER — IBUPROFEN 800 MG PO TABS: 800.0000 mg | ORAL_TABLET | Freq: Three times a day (TID) | ORAL | Status: DC
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
## 2015-05-26 NOTE — ED Notes (Signed)
Pt in CT at this time.  Family at bedside

## 2015-05-26 NOTE — Discharge Instructions (Signed)
1. Medications: ibuprofen, robaxin, usual home medications 2. Treatment: rest, drink plenty of fluids, ice 3. Follow Up: please followup with your primary doctor this week for discussion of your diagnoses and further evaluation after today's visit; if you do not have a primary care doctor use the resource guide provided to find one; please return to the ER for severe headache, vomiting, vision changes, fever, worsening pain   Ankle Pain Ankle pain is a common symptom. The bones, cartilage, tendons, and muscles of the ankle joint perform a lot of work each day. The ankle joint holds your body weight and allows you to move around. Ankle pain can occur on either side or back of 1 or both ankles. Ankle pain may be sharp and burning or dull and aching. There may be tenderness, stiffness, redness, or warmth around the ankle. The pain occurs more often when a person walks or puts pressure on the ankle. CAUSES  There are many reasons ankle pain can develop. It is important to work with your caregiver to identify the cause since many conditions can impact the bones, cartilage, muscles, and tendons. Causes for ankle pain include:  Injury, including a break (fracture), sprain, or strain often due to a fall, sports, or a high-impact activity.  Swelling (inflammation) of a tendon (tendonitis).  Achilles tendon rupture.  Ankle instability after repeated sprains and strains.  Poor foot alignment.  Pressure on a nerve (tarsal tunnel syndrome).  Arthritis in the ankle or the lining of the ankle.  Crystal formation in the ankle (gout or pseudogout). DIAGNOSIS  A diagnosis is based on your medical history, your symptoms, results of your physical exam, and results of diagnostic tests. Diagnostic tests may include X-ray exams or a computerized magnetic scan (magnetic resonance imaging, MRI). TREATMENT  Treatment will depend on the cause of your ankle pain and may include:  Keeping pressure off the ankle and  limiting activities.  Using crutches or other walking support (a cane or brace).  Using rest, ice, compression, and elevation.  Participating in physical therapy or home exercises.  Wearing shoe inserts or special shoes.  Losing weight.  Taking medications to reduce pain or swelling or receiving an injection.  Undergoing surgery. HOME CARE INSTRUCTIONS   Only take over-the-counter or prescription medicines for pain, discomfort, or fever as directed by your caregiver.  Put ice on the injured area.  Put ice in a plastic bag.  Place a towel between your skin and the bag.  Leave the ice on for 15-20 minutes at a time, 03-04 times a day.  Keep your leg raised (elevated) when possible to lessen swelling.  Avoid activities that cause ankle pain.  Follow specific exercises as directed by your caregiver.  Record how often you have ankle pain, the location of the pain, and what it feels like. This information may be helpful to you and your caregiver.  Ask your caregiver about returning to work or sports and whether you should drive.  Follow up with your caregiver for further examination, therapy, or testing as directed. SEEK MEDICAL CARE IF:   Pain or swelling continues or worsens beyond 1 week.  You have an oral temperature above 102 F (38.9 C).  You are feeling unwell or have chills.  You are having an increasingly difficult time with walking.  You have loss of sensation or other new symptoms.  You have questions or concerns. MAKE SURE YOU:   Understand these instructions.  Will watch your condition.  Will get  help right away if you are not doing well or get worse. Document Released: 03/13/2010 Document Revised: 12/16/2011 Document Reviewed: 03/13/2010 [facility] Patient Information 2015 [place], [place]. This information is not intended to replace advice given to you by your health care provider. Make sure you discuss any questions you have with your health care  provider.  Knee Pain The knee is the complex joint between your thigh and your lower leg. It is made up of bones, tendons, ligaments, and cartilage. The bones that make up the knee are:  The femur in the thigh.  The tibia and fibula in the lower leg.  The patella or kneecap riding in the groove on the lower femur. CAUSES  Knee pain is a common complaint with many causes. A few of these causes are:  Injury, such as:  A ruptured ligament or tendon injury.  Torn cartilage.  Medical conditions, such as:  Gout  Arthritis  Infections  Overuse, over training, or overdoing a physical activity. Knee pain can be minor or severe. Knee pain can accompany debilitating injury. Minor knee problems often respond well to self-care measures or get well on their own. More serious injuries may need medical intervention or even surgery. SYMPTOMS The knee is complex. Symptoms of knee problems can vary widely. Some of the problems are:  Pain with movement and weight bearing.  Swelling and tenderness.  Buckling of the knee.  Inability to straighten or extend your knee.  Your knee locks and you cannot straighten it.  Warmth and redness with pain and fever.  Deformity or dislocation of the kneecap. DIAGNOSIS  Determining what is wrong may be very straight forward such as when there is an injury. It can also be challenging because of the complexity of the knee. Tests to make a diagnosis may include:  Your caregiver taking a history and doing a physical exam.  Routine X-rays can be used to rule out other problems. X-rays will not reveal a cartilage tear. Some injuries of the knee can be diagnosed by:  Arthroscopy a surgical technique by which a small video camera is inserted through tiny incisions on the sides of the knee. This procedure is used to examine and repair internal knee joint problems. Tiny instruments can be used during arthroscopy to repair the torn knee cartilage  (meniscus).  Arthrography is a radiology technique. A contrast liquid is directly injected into the knee joint. Internal structures of the knee joint then become visible on X-ray film.  An MRI scan is a non X-ray radiology procedure in which magnetic fields and a computer produce two- or three-dimensional images of the inside of the knee. Cartilage tears are often visible using an MRI scanner. MRI scans have largely replaced arthrography in diagnosing cartilage tears of the knee.  Blood work.  Examination of the fluid that helps to lubricate the knee joint (synovial fluid). This is done by taking a sample out using a needle and a syringe. TREATMENT The treatment of knee problems depends on the cause. Some of these treatments are:  Depending on the injury, proper casting, splinting, surgery, or physical therapy care will be needed.  Give yourself adequate recovery time. Do not overuse your joints. If you begin to get sore during workout routines, back off. Slow down or do fewer repetitions.  For repetitive activities such as cycling or running, maintain your strength and nutrition.  Alternate muscle groups. For example, if you are a weight lifter, work the upper body on one day and the  lower body the next.  Either tight or weak muscles do not give the proper support for your knee. Tight or weak muscles do not absorb the stress placed on the knee joint. Keep the muscles surrounding the knee strong.  Take care of mechanical problems.  If you have flat feet, orthotics or special shoes may help. See your caregiver if you need help.  Arch supports, sometimes with wedges on the inner or outer aspect of the heel, can help. These can shift pressure away from the side of the knee most bothered by osteoarthritis.  A brace called an "unloader" brace also may be used to help ease the pressure on the most arthritic side of the knee.  If your caregiver has prescribed crutches, braces, wraps or ice,  use as directed. The acronym for this is PRICE. This means protection, rest, ice, compression, and elevation.  Nonsteroidal anti-inflammatory drugs (NSAIDs), can help relieve pain. But if taken immediately after an injury, they may actually increase swelling. Take NSAIDs with food in your stomach. Stop them if you develop stomach problems. Do not take these if you have a history of ulcers, stomach pain, or bleeding from the bowel. Do not take without your caregiver's approval if you have problems with fluid retention, heart failure, or kidney problems.  For ongoing knee problems, physical therapy may be helpful.  Glucosamine and chondroitin are over-the-counter dietary supplements. Both may help relieve the pain of osteoarthritis in the knee. These medicines are different from the usual anti-inflammatory drugs. Glucosamine may decrease the rate of cartilage destruction.  Injections of a corticosteroid drug into your knee joint may help reduce the symptoms of an arthritis flare-up. They may provide pain relief that lasts a few months. You may have to wait a few months between injections. The injections do have a small increased risk of infection, water retention, and elevated blood sugar levels.  Hyaluronic acid injected into damaged joints may ease pain and provide lubrication. These injections may work by reducing inflammation. A series of shots may give relief for as long as 6 months.  Topical painkillers. Applying certain ointments to your skin may help relieve the pain and stiffness of osteoarthritis. Ask your pharmacist for suggestions. Many over the-counter products are approved for temporary relief of arthritis pain.  In some countries, doctors often prescribe topical NSAIDs for relief of chronic conditions such as arthritis and tendinitis. A review of treatment with NSAID creams found that they worked as well as oral medications but without the serious side effects. PREVENTION  Maintain a  healthy weight. Extra pounds put more strain on your joints.  Get strong, stay limber. Weak muscles are a common cause of knee injuries. Stretching is important. Include flexibility exercises in your workouts.  Be smart about exercise. If you have osteoarthritis, chronic knee pain or recurring injuries, you may need to change the way you exercise. This does not mean you have to stop being active. If your knees ache after jogging or playing basketball, consider switching to swimming, water aerobics, or other low-impact activities, at least for a few days a week. Sometimes limiting high-impact activities will provide relief.  Make sure your shoes fit well. Choose footwear that is right for your sport.  Protect your knees. Use the proper gear for knee-sensitive activities. Use kneepads when playing volleyball or laying carpet. Buckle your seat belt every time you drive. Most shattered kneecaps occur in car accidents.  Rest when you are tired. SEEK MEDICAL CARE IF:  You  have knee pain that is continual and does not seem to be getting better.  SEEK IMMEDIATE MEDICAL CARE IF:  Your knee joint feels hot to the touch and you have a high fever. MAKE SURE YOU:   Understand these instructions.  Will watch your condition.  Will get help right away if you are not doing well or get worse. Document Released: 07/21/2007 Document Revised: 12/16/2011 Document Reviewed: 07/21/2007 [facility] Patient Information 2015 [place], [place]. This information is not intended to replace advice given to you by your health care provider. Make sure you discuss any questions you have with your health care provider.  Cervical Sprain A cervical sprain is when the tissues (ligaments) that hold the neck bones in place stretch or tear. HOME CARE   Put ice on the injured area.  Put ice in a plastic bag.  Place a towel between your skin and the bag.  Leave the ice on for 15-20 minutes, 3-4 times a day.  You may have been  given a collar to wear. This collar keeps your neck from moving while you heal.  Do not take the collar off unless told by your doctor.  If you have long hair, keep it outside of the collar.  Ask your doctor before changing the position of your collar. You may need to change its position over time to make it more comfortable.  If you are allowed to take off the collar for cleaning or bathing, follow your doctor's instructions on how to do it safely.  Keep your collar clean by wiping it with mild soap and water. Dry it completely. If the collar has removable pads, remove them every 1-2 days to hand wash them with soap and water. Allow them to air dry. They should be dry before you wear them in the collar.  Do not drive while wearing the collar.  Only take medicine as told by your doctor.  Keep all doctor visits as told.  Keep all physical therapy visits as told.  Adjust your work station so that you have good posture while you work.  Avoid positions and activities that make your problems worse.  Warm up and stretch before being active. GET HELP IF:  Your pain is not controlled with medicine.  You cannot take less pain medicine over time as planned.  Your activity level does not improve as expected. GET HELP RIGHT AWAY IF:   You are bleeding.  Your stomach is upset.  You have an allergic reaction to your medicine.  You develop new problems that you cannot explain.  You lose feeling (become numb) or you cannot move any part of your body (paralysis).  You have tingling or weakness in any part of your body.  Your symptoms get worse. Symptoms include:  Pain, soreness, stiffness, puffiness (swelling), or a burning feeling in your neck.  Pain when your neck is touched.  Shoulder or upper back pain.  Limited ability to move your neck.  Headache.  Dizziness.  Your hands or arms feel week, lose feeling, or tingle.  Muscle spasms.  Difficulty swallowing or  chewing. MAKE SURE YOU:   Understand these instructions.  Will watch your condition.  Will get help right away if you are not doing well or get worse. Document Released: 03/11/2008 Document Revised: 05/26/2013 Document Reviewed: 03/31/2013 [facility] Patient Information 2015 [place], [place]. This information is not intended to replace advice given to you by your health care provider. Make sure you discuss any questions you have with  your health care provider.    General Headache Without Cause A general headache is pain or discomfort felt around the head or neck area. The cause may not be found.  HOME CARE   Keep all doctor visits.  Only take medicines as told by your doctor.  Lie down in a dark, quiet room when you have a headache.  Keep a journal to find out if certain things bring on headaches. For example, write down:  What you eat and drink.  How much sleep you get.  Any change to your diet or medicines.  Relax by getting a massage or doing other relaxing activities.  Put ice or heat packs on the head and neck area as told by your doctor.  Lessen stress.  Sit up straight. Do not tighten (tense) your muscles.  Quit smoking if you smoke.  Lessen how much alcohol you drink.  Lessen how much caffeine you drink, or stop drinking caffeine.  Eat and sleep on a regular schedule.  Get 7 to 9 hours of sleep, or as told by your doctor.  Keep lights dim if bright lights bother you or make your headaches worse. GET HELP RIGHT AWAY IF:   Your headache becomes really bad.  You have a fever.  You have a stiff neck.  You have trouble seeing.  Your muscles are weak, or you lose muscle control.  You lose your balance or have trouble walking.  You feel like you will pass out (faint), or you pass out.  You have really bad symptoms that are different than your first symptoms.  You have problems with the medicines given to you by your doctor.  Your medicines do not  work.  Your headache feels different than the other headaches.  You feel sick to your stomach (nauseous) or throw up (vomit). MAKE SURE YOU:   Understand these instructions.  Will watch your condition.  Will get help right away if you are not doing well or get worse. Document Released: 07/02/2008 Document Revised: 12/16/2011 Document Reviewed: 09/13/2011 [facility] Patient Information 2015 [place], [place]. This information is not intended to replace advice given to you by your health care provider. Make sure you discuss any questions you have with your health care provider.   Emergency Department Resource Guide 1) Find a Doctor and Pay Out of Pocket Although you won't have to find out who is covered by your insurance plan, it is a good idea to ask around and get recommendations. You will then need to call the office and see if the doctor you have chosen will accept you as a new patient and what types of options they offer for patients who are self-pay. Some doctors offer discounts or will set up payment plans for their patients who do not have insurance, but you will need to ask so you aren't surprised when you get to your appointment.  2) Contact Your Local Health Department Not all health departments have doctors that can see patients for sick visits, but many do, so it is worth a call to see if yours does. If you don't know where your local health department is, you can check in your phone book. The CDC also has a tool to help you locate your state's health department, and many state websites also have listings of all of their local health departments.  3) Find a [facility] If your illness is not likely to be very severe or complicated, you may want to try a walk in  clinic. These are popping up all over the country in pharmacies, drugstores, and shopping centers. They're usually staffed by nurse practitioners or physician assistants that have been trained to treat common illnesses and  complaints. They're usually fairly quick and inexpensive. However, if you have serious medical issues or chronic medical problems, these are probably not your best option.  No Primary Care Doctor: - Call Health Connect at  [phone] - they can help you locate a primary care doctor that  accepts your insurance, provides certain services, etc. - Physician Referral Service- [phone]  Chronic Pain Problems: Organization         Address  Phone   Notes  [facility]  [phone] Patients need to be referred by their primary care doctor.   Medication Assistance: Organization         Address  Phone   Notes  [facility] Medication [facility] [address] [place], [place] [address] [phone] --Must be a resident of [facility] -- Must have NO insurance coverage whatsoever (no Medicaid/ Medicare, etc.) -- The pt. MUST have a primary care doctor that directs their care regularly and follows them in the community   [facility]  [phone]   [employer]  [phone]    Agencies that provide inexpensive medical care: Organization         Address  Phone   Notes  [place]  [phone]   [facility]    [phone]   [facility] [place], [place] [address] [phone]   [place] [address] [place] [phone]   Planned Parenthood    [phone]   [facility]    [phone]   [place] and [address], [place] Phone:  [phone], Fax:  [phone] Hours of Operation:  9 am - 6 pm, M-F.  Also accepts Medicaid/Medicare and self-pay.  [facility] for [place] [address], [address], [place] Phone: [phone], Fax: [phone]. Hours of Operation:  8:30 am - 5:30 pm, M-F.  Also accepts Medicaid and self-pay.  [facility] [address] [place] Phone: [phone]   [place], [place], [place] [phone], Ext. [phone] Mondays & Thursdays: 7-9 AM.  First 15 patients are seen on a first come, first serve basis.    [place] Providers:  Organization         Address  Phone   Notes  [facility] [address], [place] [phone] Also accepts self-pay patients.  [facility] [address], [place]  [phone]   [place], [address], [place] [phone]   [facility] [address], [place] [phone]   Lucianne Lei [address], [place]   [phone] Only accepts [place] patients after they have their name applied to their card.   Self-Pay (no insurance) in [facility]:  Organization         Address  Phone   Notes  Sickle Cell Patients, [facility] [place] [phone]   [facility] [place] [phone]   [facility] [place]  [address], [place] [phone]   [facility]/Dr. Osei-Bonsu  [address], [place] or [address], [place] [phone] Phone number for both [place] and [place] locations is the same.  Urgent [facility] and [facility] [address], [place] [phone]   [facility] [address] [place] or [address] [phone]   [facility] [address], [place] [phone], phone; [phone], fax Sees patients 1st and 3rd Saturday of every month.  Must not qualify for public or private insurance (i.e. Medicaid, Medicare, [place] Health Choice, Veterans' Benefits)  Household income should be no more than 200% of the poverty level  The clinic cannot treat you if you are pregnant or think you are pregnant  Sexually transmitted diseases are not treated at the clinic.    Dental Care: Organization         Address  Phone  Notes  [place] Department of [facility] [place] [phone] Accepts children up to age 48 who are enrolled in [place] or [place]; pregnant women with a Medicaid card; and children who have applied for Medicaid or [place] Health Choice, but were declined, whose parents can pay a reduced fee at time of service.  [facility] of [facility]  [address], [place] [phone] Accepts children up to age 69 who are enrolled in [place] or [place]; pregnant women with a Medicaid card; and children who have applied for Medicaid or [place] Health Choice, but were declined, whose parents can pay a reduced fee at time of service.  [place] Adult Dental Access PROGRAM  [place] [phone] Patients are seen by appointment only. Walk-ins are not accepted. [place] will see patients 40 years of age and older. Monday - Tuesday (8am-5pm) Most Wednesdays (8:30-5pm) $30 per visit, cash only  [facility] Adult [facility] Access PRO[facility]  [address], [facility] [phone] Patients are seen by appointment only. Walk-ins are not accepted. [place] will see patients 78 years of age and older. One Wednesday Evening (Monthly: Volunteer Based).  $30 per visit, cash only  [place]  [phone] for adults; Children under age 73, call Graduate Pediatric Dentistry at [phone]. Children aged 19-14, please call [phone] to request a pediatric application.  Dental services are provided in all areas of dental care including fillings, crowns and bridges, complete and partial dentures, implants, gum treatment, root canals, and extractions. Preventive care is  also provided. Treatment is provided to both adults and children. Patients are selected via a lottery and there is often a waiting list.   [facility] [address], [place]  [phone] [email or web address]   [facility] [address] [place], [place] [phone], Ext. [phone] Second and Fourth Thursday of each month, opens at 6:30 AM; Clinic ends at 9 AM.  Patients are seen on a first-come first-served basis, and a limited number are seen during each clinic.   [facility]  [address] [place], [place] [phone]   Eligibility Requirements You must have lived in [place], [place], or [place] counties for at least the last three months.   You cannot be eligible for state or federal sponsored [employer], including  [employer], [place], or [employer].   You generally cannot be eligible for healthcare insurance through your employer.    How to apply: Eligibility screenings are held every Tuesday and Wednesday afternoon from 1:00 pm until 4:00 pm. You do not need an appointment for the interview!  [facility] [address] [place] [place]   [place]  [place] Department  [place]  [phone]    Behavioral Health Resources in the Community: Intensive Outpatient Programs Organization         Address  Phone  Notes  [place] [facility]. [address], [place], [place] [phone]   [facility] Outpatient [address], [place], [place]   ADS: [facility] [address] [place], [place]   [place] [address]  [place], [place] or [phone]   Substance Abuse Resources Organization         Address  Phone  Notes  [facility]  [phone]   [place]  [phone]   [facility]   Chinita Pester  [phone]   Residential & Outpatient Substance Abuse Program  [phone]   Psychological Services Organization         Address  Phone  Notes  [facility]  [place]  [phone]   [place] [address], [place] or [phone]    Mobile Crisis Teams Organization         Address  Phone  Notes  [facility], Mobile Crisis Care Unit  [phone]   [facility]  [address] [place], [place]   [place] [address], [place] [phone]    Self-Help/Support Groups Organization         Address  Phone             Notes  [place] [place] - variety of support groups  [place] Call for more information  Narcotics Anonymous (NA), [facility] [address], [employer] [place]  2 meetings at this location   Special educational needs teacher         Address  Phone  Notes  [facility] [place],    [place]  [phone]   [facility]  [address] [place] [address], [place], [place]   [place], [place] [phone] Admissions: 8am-3pm M-F  Incentives Substance [place] [address],    [place], [place] [phone]   [facility] [address] [place], [place], [place]   [facility] [address],  [place], [place]   [facility] - Intensive Outpatient [address], [place], [place]   [facility].,  [place], [place] [phone] or [phone]   [facility] ([facility]) [address], [place], [place] Accepts Medicaid  [facility] [place] [address],  [place] [phone]  Substance Abuse/Addiction Treatment   [facility] Organization         Address  Phone  Notes  [facility]  [phone]   Domenic Schwab, PhD [address], [place]   [phone] or [phone]  [facility]   [address] [place], [place] [phone]   [address], [place], [place] [phone] Insurance/Medicaid/sponsorship through [facility] and Families [address], Ste [place], [place] [phone] [place], [place] [phone]    Dr. Adele Schilder  [phone]   [facility] Dept. 1) [address], [place] 2) [place] 3)  [place] [address], [place] [phone]  [phone]   [place] [phone] or [phone] (After Hours)
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
## 2015-08-13 ENCOUNTER — Other Ambulatory Visit: Payer: Self-pay

## 2015-08-13 ENCOUNTER — Emergency Department (HOSPITAL_COMMUNITY): Payer: Medicaid Other

## 2015-08-13 ENCOUNTER — Emergency Department (HOSPITAL_COMMUNITY)
Admission: EM | Admit: 2015-08-13 | Discharge: 2015-08-13 | Disposition: A | Discharge status: Home / Self Care | Payer: Medicaid Other | Attending: Emergency Medicine | Admitting: Emergency Medicine

## 2015-08-13 ENCOUNTER — Encounter (HOSPITAL_COMMUNITY): Payer: Self-pay | Admitting: Emergency Medicine

## 2015-08-13 DIAGNOSIS — Z79899 Other long term (current) drug therapy: Secondary | ICD-10-CM | POA: Diagnosis not present

## 2015-08-13 DIAGNOSIS — Z720 Tobacco use: Secondary | ICD-10-CM | POA: Diagnosis not present

## 2015-08-13 DIAGNOSIS — Z791 Long term (current) use of non-steroidal anti-inflammatories (NSAID): Secondary | ICD-10-CM | POA: Diagnosis not present

## 2015-08-13 DIAGNOSIS — Z8659 Personal history of other mental and behavioral disorders: Secondary | ICD-10-CM | POA: Diagnosis not present

## 2015-08-13 DIAGNOSIS — R1012 Left upper quadrant pain: Secondary | ICD-10-CM | POA: Diagnosis present

## 2015-08-13 DIAGNOSIS — Z3202 Encounter for pregnancy test, result negative: Secondary | ICD-10-CM | POA: Insufficient documentation

## 2015-08-13 DIAGNOSIS — R109 Unspecified abdominal pain: Secondary | ICD-10-CM | POA: Insufficient documentation

## 2015-08-13 DIAGNOSIS — I10 Essential (primary) hypertension: Secondary | ICD-10-CM | POA: Diagnosis not present

## 2015-08-13 LAB — URINALYSIS W MICROSCOPIC (NOT AT ARMC)
Glucose, UA: NEGATIVE mg/dL
Ketones, ur: 15 mg/dL — AB
Leukocytes, UA: NEGATIVE
Nitrite: NEGATIVE
Protein, ur: NEGATIVE mg/dL
Specific Gravity, Urine: 1.024 (ref 1.005–1.030)
Urobilinogen, UA: 0.2 mg/dL (ref 0.0–1.0)
pH: 6 (ref 5.0–8.0)

## 2015-08-13 LAB — CBC
HCT: 35.1 % — ABNORMAL LOW (ref 36.0–46.0)
Hemoglobin: 11.4 g/dL — ABNORMAL LOW (ref 12.0–15.0)
MCH: 28 pg (ref 26.0–34.0)
MCHC: 32.5 g/dL (ref 30.0–36.0)
MCV: 86.2 fL (ref 78.0–100.0)
Platelets: 362 10*3/uL (ref 150–400)
RBC: 4.07 MIL/uL (ref 3.87–5.11)
RDW: 15.4 % (ref 11.5–15.5)
WBC: 7.8 10*3/uL (ref 4.0–10.5)

## 2015-08-13 LAB — HEPATIC FUNCTION PANEL
ALBUMIN: 3.8 g/dL (ref 3.5–5.0)
ALT: 17 U/L (ref 14–54)
AST: 25 U/L (ref 15–41)
Alkaline Phosphatase: 74 U/L (ref 38–126)
BILIRUBIN TOTAL: 1 mg/dL (ref 0.3–1.2)
Bilirubin, Direct: 0.1 mg/dL (ref 0.1–0.5)
Indirect Bilirubin: 0.9 mg/dL (ref 0.3–0.9)
TOTAL PROTEIN: 6.9 g/dL (ref 6.5–8.1)

## 2015-08-13 LAB — BASIC METABOLIC PANEL
Anion gap: 8 (ref 5–15)
BUN: 10 mg/dL (ref 6–20)
CALCIUM: 9 mg/dL (ref 8.9–10.3)
CO2: 27 mmol/L (ref 22–32)
Chloride: 105 mmol/L (ref 101–111)
Creatinine, Ser: 0.78 mg/dL (ref 0.44–1.00)
GFR calc Af Amer: >60 mL/min (ref >60)
GLUCOSE: 95 mg/dL (ref 65–99)
Potassium: 2.9 mmol/L — ABNORMAL LOW (ref 3.5–5.1)
Sodium: 140 mmol/L (ref 135–145)

## 2015-08-13 LAB — PREGNANCY, URINE: PREG TEST UR: NEGATIVE

## 2015-08-13 MED ORDER — TRAMADOL HCL 50 MG PO TABS: 50.0000 mg | ORAL_TABLET | Freq: Four times a day (QID) | ORAL | Status: DC | PRN

## 2015-08-13 MED ORDER — MORPHINE SULFATE (PF) 4 MG/ML IV SOLN
4.0000 mg | Freq: Once | INTRAVENOUS | Status: AC
  Administered 2015-08-13: 4 mg via INTRAVENOUS
  Filled 2015-08-13: qty 1

## 2015-08-13 NOTE — ED Provider Notes (Signed)
CSN: [id number]     Arrival date & time 08/13/15  0112 History  By signing my name below, I, Hansel Feinstein, attest that this documentation has been prepared under the direction and in the presence of Veryl Speak, MD. Electronically Signed: Hansel Feinstein, ED Scribe. 08/13/2015. 2:41 AM.    No chief complaint on file.  Patient is a 39 y.o. female presenting with abdominal pain. The history is provided by the patient. No language interpreter was used.  Abdominal Pain Pain location:  LUQ Pain quality: sharp   Pain radiates to:  Does not radiate Pain severity:  Moderate Onset quality:  Gradual Timing:  Intermittent Progression:  Worsening Chronicity:  Recurrent Relieved by:  None tried Worsened by:  Nothing tried Associated symptoms: no dysuria     HPI Comments: Sarah [name] is a 39 y.o. female who presents to the Emergency Department complaining of moderate, sharp, intermittent LUQ abdominal pain onset tonight. She notes that she got up from the bed to use the restroom when her pain came on. She reports that her pain is unaffected by breathing. Pt states h/o similar symptoms 2 years ago and was seen in the ED. Her bilirubin was noted to be abnormal but she had no other acute findings. She takes daily HCTZ. No h/o cholecystectomy, renal calculi, abdominal surgeries. No other daily medications. She denies dysuria, difficulty urinating, back pain.   Past Medical History  Diagnosis Date  . Hypertension   . Depression   . Anxiety    No past surgical history on file. Family History  Problem Relation Age of Onset  . Anesthesia problems Neg Hx   . Malignant hyperthermia Neg Hx   . Pseudochol deficiency Neg Hx   . Hypotension Neg Hx    Social History  Substance Use Topics  . Smoking status: Current Every Day Smoker -- 0.50 packs/day for 4 years    Types: Cigarettes  . Smokeless tobacco: Not on file  . Alcohol Use: No   OB History    No data available     Review of Systems   Gastrointestinal: Positive for abdominal pain.  Genitourinary: Negative for dysuria and difficulty urinating.  Musculoskeletal: Negative for back pain.  All other systems reviewed and are negative.  Allergies  Review of patient's allergies indicates no known allergies.  Home Medications   Prior to Admission medications   Medication Sig Start Date End Date Taking? Authorizing Provider  hydrochlorothiazide (HYDRODIURIL) 25 MG tablet Take 1 tablet (25 mg total) by mouth daily. 05/26/15   Heather Laisure, PA-C  ibuprofen (ADVIL,MOTRIN) 800 MG tablet Take 1 tablet (800 mg total) by mouth 3 (three) times daily. 05/26/15   Marella Chimes, PA-C  methocarbamol (ROBAXIN) 500 MG tablet Take 1 tablet (500 mg total) by mouth 2 (two) times daily. 05/26/15   Marella Chimes, PA-C   There were no vitals taken for this visit. Physical Exam  Constitutional: She is oriented to person, place, and time. She appears well-developed and well-nourished. No distress.  HENT:  Head: Normocephalic and atraumatic.  Eyes: EOM are normal.  Neck: Normal range of motion.  Cardiovascular: Normal rate, regular rhythm and normal heart sounds.   Pulmonary/Chest: Effort normal and breath sounds normal.  Abdominal: Soft. She exhibits no distension. There is no tenderness.  Musculoskeletal: Normal range of motion.  Neurological: She is alert and oriented to person, place, and time.  Skin: Skin is warm and dry.  Psychiatric: She has a normal mood and affect. Judgment  normal.  Nursing note and vitals reviewed.   ED Course  Procedures (including critical care time) DIAGNOSTIC STUDIES: Oxygen Saturation is 100% on RA, normal by my interpretation.    COORDINATION OF CARE: 1:50 AM Discussed treatment plan with pt at bedside and pt agreed to plan.   Labs Review Labs Reviewed  CBC - Abnormal; Notable for the following:    Hemoglobin 11.4 (*)    HCT 35.1 (*)    All other components within normal limits   URINALYSIS W MICROSCOPIC - Abnormal; Notable for the following:    APPearance CLOUDY (*)    Hgb urine dipstick LARGE (*)    Bilirubin Urine SMALL (*)    Ketones, ur 15 (*)    Bacteria, UA FEW (*)    Squamous Epithelial / LPF FEW (*)    All other components within normal limits  PREGNANCY, URINE  BASIC METABOLIC PANEL  HEPATIC FUNCTION PANEL  I have personally reviewed and evaluated these lab results as part of my medical decision-making.  MDM   Final diagnoses:  None    Patient presents here with complaints of left flank pain.  Her ct scan shows no evidence for calculus or other pathology.  Her laboratory studies are reassuring.  She will be discharged to home, to follow up with GI if not improving in the next week.  I personally performed the services described in this documentation, which was scribed in my presence. The recorded information has been reviewed and is accurate.    ,  Veryl Speak, MD 08/13/15 [phone]

## 2015-08-13 NOTE — Discharge Instructions (Signed)
Tramadol as prescribed as needed for pain.  Follow up with gastroenterology if not improving in the next few days.  The contact information for [facility] has been provided in this discharge summary.   Abdominal Pain, Adult Many things can cause abdominal pain. Usually, abdominal pain is not caused by a disease and will improve without treatment. It can often be observed and treated at home. Your health care provider will do a physical exam and possibly order blood tests and X-rays to help determine the seriousness of your pain. However, in many cases, more time must pass before a clear cause of the pain can be found. Before that point, your health care provider may not know if you need more testing or further treatment. HOME CARE INSTRUCTIONS Monitor your abdominal pain for any changes. The following actions may help to alleviate any discomfort you are experiencing:  Only take over-the-counter or prescription medicines as directed by your health care provider.  Do not take laxatives unless directed to do so by your health care provider.  Try a clear liquid diet (broth, tea, or water) as directed by your health care provider. Slowly move to a bland diet as tolerated. SEEK MEDICAL CARE IF:  You have unexplained abdominal pain.  You have abdominal pain associated with nausea or diarrhea.  You have pain when you urinate or have a bowel movement.  You experience abdominal pain that wakes you in the night.  You have abdominal pain that is worsened or improved by eating food.  You have abdominal pain that is worsened with eating fatty foods.  You have a fever. SEEK IMMEDIATE MEDICAL CARE IF:  Your pain does not go away within 2 hours.  You keep throwing up (vomiting).  Your pain is felt only in portions of the abdomen, such as the right side or the left lower portion of the abdomen.  You pass bloody or black tarry stools. MAKE SURE YOU:  Understand these  instructions.  Will watch your condition.  Will get help right away if you are not doing well or get worse.   This information is not intended to replace advice given to you by your health care provider. Make sure you discuss any questions you have with your health care provider.   Document Released: 07/03/2005 Document Revised: 06/14/2015 Document Reviewed: 06/02/2013 Elsevier Interactive Patient Education [employer].

## 2015-08-13 NOTE — ED Notes (Signed)
Taken to CT at this time. 

## 2015-08-13 NOTE — ED Notes (Signed)
Brought in via ems with c/o left upper abdominal pain that radiates to left side.  Woken from sleep.  Had the same thing 2 years ago.  Remember something about bilirubin being off.  EKG wnl via EMS.

## 2015-08-13 NOTE — ED Notes (Signed)
Pt back from CT

## 2015-08-17 ENCOUNTER — Inpatient Hospital Stay (HOSPITAL_COMMUNITY)
Admission: AD | Admit: 2015-08-17 | Discharge: 2015-08-24 | DRG: 885 | Disposition: A | Discharge status: Home / Self Care | Payer: Medicaid Other | Source: Intra-hospital | Attending: Psychiatry | Admitting: Psychiatry

## 2015-08-17 ENCOUNTER — Emergency Department (HOSPITAL_COMMUNITY)
Admission: EM | Admit: 2015-08-17 | Discharge: 2015-08-17 | Disposition: A | Discharge status: Other Acute Inpatient Hospital | Payer: Medicaid Other | Attending: Emergency Medicine | Admitting: Emergency Medicine

## 2015-08-17 ENCOUNTER — Encounter (HOSPITAL_COMMUNITY): Payer: Self-pay | Admitting: Family Medicine

## 2015-08-17 ENCOUNTER — Encounter (HOSPITAL_COMMUNITY): Payer: Self-pay | Admitting: *Deleted

## 2015-08-17 DIAGNOSIS — F70 Mild intellectual disabilities: Secondary | ICD-10-CM | POA: Diagnosis present

## 2015-08-17 DIAGNOSIS — F259 Schizoaffective disorder, unspecified: Secondary | ICD-10-CM | POA: Diagnosis present

## 2015-08-17 DIAGNOSIS — E876 Hypokalemia: Secondary | ICD-10-CM | POA: Diagnosis present

## 2015-08-17 DIAGNOSIS — F250 Schizoaffective disorder, bipolar type: Secondary | ICD-10-CM | POA: Diagnosis present

## 2015-08-17 DIAGNOSIS — F251 Schizoaffective disorder, depressive type: Secondary | ICD-10-CM | POA: Diagnosis not present

## 2015-08-17 DIAGNOSIS — Z8249 Family history of ischemic heart disease and other diseases of the circulatory system: Secondary | ICD-10-CM

## 2015-08-17 DIAGNOSIS — F29 Unspecified psychosis not due to a substance or known physiological condition: Secondary | ICD-10-CM | POA: Insufficient documentation

## 2015-08-17 DIAGNOSIS — G4700 Insomnia, unspecified: Secondary | ICD-10-CM | POA: Diagnosis present

## 2015-08-17 DIAGNOSIS — Z818 Family history of other mental and behavioral disorders: Secondary | ICD-10-CM | POA: Diagnosis not present

## 2015-08-17 DIAGNOSIS — I10 Essential (primary) hypertension: Secondary | ICD-10-CM | POA: Diagnosis present

## 2015-08-17 DIAGNOSIS — Z3202 Encounter for pregnancy test, result negative: Secondary | ICD-10-CM | POA: Diagnosis not present

## 2015-08-17 DIAGNOSIS — F329 Major depressive disorder, single episode, unspecified: Secondary | ICD-10-CM | POA: Diagnosis not present

## 2015-08-17 DIAGNOSIS — F419 Anxiety disorder, unspecified: Secondary | ICD-10-CM | POA: Diagnosis present

## 2015-08-17 DIAGNOSIS — F79 Unspecified intellectual disabilities: Secondary | ICD-10-CM | POA: Diagnosis not present

## 2015-08-17 DIAGNOSIS — Z720 Tobacco use: Secondary | ICD-10-CM | POA: Diagnosis not present

## 2015-08-17 DIAGNOSIS — F1220 Cannabis dependence, uncomplicated: Secondary | ICD-10-CM | POA: Diagnosis present

## 2015-08-17 DIAGNOSIS — Z046 Encounter for general psychiatric examination, requested by authority: Secondary | ICD-10-CM | POA: Diagnosis present

## 2015-08-17 DIAGNOSIS — Z9114 Patient's other noncompliance with medication regimen: Secondary | ICD-10-CM

## 2015-08-17 DIAGNOSIS — F1210 Cannabis abuse, uncomplicated: Secondary | ICD-10-CM | POA: Insufficient documentation

## 2015-08-17 DIAGNOSIS — F17210 Nicotine dependence, cigarettes, uncomplicated: Secondary | ICD-10-CM | POA: Diagnosis present

## 2015-08-17 LAB — COMPREHENSIVE METABOLIC PANEL
ALT: 36 U/L (ref 14–54)
AST: 75 U/L — AB (ref 15–41)
Albumin: 4.3 g/dL (ref 3.5–5.0)
Alkaline Phosphatase: 71 U/L (ref 38–126)
Anion gap: 9 (ref 5–15)
BILIRUBIN TOTAL: 1.2 mg/dL (ref 0.3–1.2)
BUN: 14 mg/dL (ref 6–20)
CALCIUM: 9 mg/dL (ref 8.9–10.3)
CHLORIDE: 103 mmol/L (ref 101–111)
CO2: 28 mmol/L (ref 22–32)
CREATININE: 0.82 mg/dL (ref 0.44–1.00)
Glucose, Bld: 98 mg/dL (ref 65–99)
Potassium: 2.7 mmol/L — CL (ref 3.5–5.1)
SODIUM: 140 mmol/L (ref 135–145)
Total Protein: 7.9 g/dL (ref 6.5–8.1)

## 2015-08-17 LAB — RAPID URINE DRUG SCREEN, HOSP PERFORMED
AMPHETAMINES: NOT DETECTED
Barbiturates: NOT DETECTED
Benzodiazepines: NOT DETECTED
Cocaine: NOT DETECTED
Opiates: NOT DETECTED
TETRAHYDROCANNABINOL: POSITIVE — AB

## 2015-08-17 LAB — URINE MICROSCOPIC-ADD ON

## 2015-08-17 LAB — URINALYSIS, ROUTINE W REFLEX MICROSCOPIC
Bilirubin Urine: NEGATIVE
Glucose, UA: NEGATIVE mg/dL
KETONES UR: NEGATIVE mg/dL
LEUKOCYTES UA: NEGATIVE
NITRITE: NEGATIVE
PROTEIN: NEGATIVE mg/dL
Specific Gravity, Urine: 1.017 (ref 1.005–1.030)
Urobilinogen, UA: 0.2 mg/dL (ref 0.0–1.0)
pH: 6.5 (ref 5.0–8.0)

## 2015-08-17 LAB — ETHANOL

## 2015-08-17 LAB — CBC
HCT: 35.9 % — ABNORMAL LOW (ref 36.0–46.0)
HEMOGLOBIN: 11.7 g/dL — AB (ref 12.0–15.0)
MCH: 28.2 pg (ref 26.0–34.0)
MCHC: 32.6 g/dL (ref 30.0–36.0)
MCV: 86.5 fL (ref 78.0–100.0)
PLATELETS: 405 10*3/uL — AB (ref 150–400)
RBC: 4.15 MIL/uL (ref 3.87–5.11)
RDW: 15.4 % (ref 11.5–15.5)
WBC: 7.9 10*3/uL (ref 4.0–10.5)

## 2015-08-17 LAB — PREGNANCY, URINE: PREG TEST UR: NEGATIVE

## 2015-08-17 MED ORDER — TRAZODONE HCL 100 MG PO TABS: 100.0000 mg | ORAL_TABLET | Freq: Every evening | ORAL | Status: DC | PRN

## 2015-08-17 MED ORDER — OLANZAPINE 5 MG PO TABS: 5.0000 mg | ORAL_TABLET | Freq: Two times a day (BID) | ORAL | Status: DC

## 2015-08-17 MED ORDER — MAGNESIUM HYDROXIDE 400 MG/5ML PO SUSP
30.0000 mL | Freq: Every day | ORAL | Status: DC | PRN
  Administered 2015-08-22: 30 mL via ORAL
  Filled 2015-08-17: qty 30

## 2015-08-17 MED ORDER — POTASSIUM CHLORIDE 10 MEQ/100ML IV SOLN
10.0000 meq | Freq: Once | INTRAVENOUS | Status: AC
  Administered 2015-08-17: 10 meq via INTRAVENOUS
  Filled 2015-08-17: qty 100

## 2015-08-17 MED ORDER — ACETAMINOPHEN 325 MG PO TABS
650.0000 mg | ORAL_TABLET | Freq: Four times a day (QID) | ORAL | Status: DC | PRN
  Administered 2015-08-18: 650 mg via ORAL
  Filled 2015-08-17: qty 2

## 2015-08-17 MED ORDER — OLANZAPINE 5 MG PO TABS
5.0000 mg | ORAL_TABLET | Freq: Two times a day (BID) | ORAL | Status: DC
  Administered 2015-08-17 – 2015-08-18 (×2): 5 mg via ORAL
  Filled 2015-08-17 (×3): qty 1
  Filled 2015-08-17: qty 2
  Filled 2015-08-17: qty 1

## 2015-08-17 MED ORDER — ALUM & MAG HYDROXIDE-SIMETH 200-200-20 MG/5ML PO SUSP: 30.0000 mL | ORAL | Status: DC | PRN

## 2015-08-17 MED ORDER — TRAZODONE HCL 100 MG PO TABS
100.0000 mg | ORAL_TABLET | Freq: Every evening | ORAL | Status: DC | PRN
  Administered 2015-08-17 – 2015-08-22 (×5): 100 mg via ORAL
  Filled 2015-08-17 (×5): qty 1

## 2015-08-17 MED ORDER — POTASSIUM CHLORIDE CRYS ER 20 MEQ PO TBCR
40.0000 meq | EXTENDED_RELEASE_TABLET | Freq: Once | ORAL | Status: AC
  Administered 2015-08-17: 40 meq via ORAL
  Filled 2015-08-17: qty 2
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
## 2015-08-17 NOTE — ED Provider Notes (Signed)
CSN: [id number]     Arrival date & time 08/17/15  0241 History   First MD Initiated Contact with Patient 08/17/15 0249     Chief Complaint  Patient presents with  . Medical Clearance     (Consider location/radiation/quality/duration/timing/severity/associated sxs/prior Treatment) HPI Comments: The patient is brought to the emergency room by [employer], called by mom for concerning behavior such as getting into the shower with her clothes on, pacing, avoiding answering questions directly, talking to people that aren't there. The patient denies behaviors. She has no complaints but states she is here because her family thought she needed to be evaluated so she was willing. No SI/HI. She denies auditory or visual hallucinations.   The history is provided by the patient. No language interpreter was used.    Past Medical History  Diagnosis Date  . Hypertension   . Depression   . Anxiety    History reviewed. No pertinent past surgical history. Family History  Problem Relation Age of Onset  . Anesthesia problems Neg Hx   . Malignant hyperthermia Neg Hx   . Pseudochol deficiency Neg Hx   . Hypotension Neg Hx    Social History  Substance Use Topics  . Smoking status: Current Every Day Smoker -- 0.50 packs/day for 4 years    Types: Cigarettes  . Smokeless tobacco: None  . Alcohol Use: No   OB History    No data available     Review of Systems  Constitutional: Negative for fever and chills.  HENT: Negative.   Respiratory: Negative.   Cardiovascular: Negative.   Gastrointestinal: Negative.   Musculoskeletal: Negative.   Skin: Negative.   Neurological: Negative.       Allergies  Review of patient's allergies indicates no known allergies.  Home Medications   Prior to Admission medications   Medication Sig Start Date End Date Taking? Authorizing Provider  acetaminophen (TYLENOL) 500 MG tablet Take 500 mg by mouth every 6 (six) hours as needed for headache.   Yes Historical  Provider, MD  hydrochlorothiazide (HYDRODIURIL) 25 MG tablet Take 1 tablet (25 mg total) by mouth daily. 05/26/15  Yes Heather Laisure, PA-C  ibuprofen (ADVIL,MOTRIN) 800 MG tablet Take 1 tablet (800 mg total) by mouth 3 (three) times daily. Patient not taking: Reported on 08/13/2015 05/26/15   Marella Chimes, PA-C  methocarbamol (ROBAXIN) 500 MG tablet Take 1 tablet (500 mg total) by mouth 2 (two) times daily. Patient not taking: Reported on 08/13/2015 05/26/15   Marella Chimes, PA-C  traMADol (ULTRAM) 50 MG tablet Take 1 tablet (50 mg total) by mouth every 6 (six) hours as needed. Patient taking differently: Take 50 mg by mouth every 6 (six) hours as needed for moderate pain.  08/13/15   Veryl Speak, MD   BP 176/102 mmHg  Pulse 73  Temp(Src) 97.8 F (36.6 C) (Oral)  Resp 16  Ht 5\' 5"  (1.651 m)  Wt 255 lb (115.667 kg)  BMI 42.43 kg/m2  SpO2 98%  LMP  Physical Exam  Constitutional: She appears well-developed and well-nourished.  HENT:  Head: Normocephalic.  Neck: Normal range of motion. Neck supple.  Cardiovascular: Normal rate and regular rhythm.   Pulmonary/Chest: Effort normal and breath sounds normal.  Abdominal: Soft. Bowel sounds are normal. There is no tenderness. There is no rebound and no guarding.  Musculoskeletal: Normal range of motion.  Neurological: She is alert. No cranial nerve deficit.  Skin: Skin is warm and dry. No rash noted.  Psychiatric: She has  a normal mood and affect.    ED Course  Procedures (including critical care time) Labs Review Labs Reviewed  CBC - Abnormal; Notable for the following:    Hemoglobin 11.7 (*)    HCT 35.9 (*)    Platelets 405 (*)    All other components within normal limits  COMPREHENSIVE METABOLIC PANEL  ETHANOL  URINE RAPID DRUG SCREEN, HOSP PERFORMED   Results for orders placed or performed during the hospital encounter of 08/17/15  Comprehensive metabolic panel  Result Value Ref Range   Sodium 140 135 - 145  mmol/L   Potassium 2.7 (LL) 3.5 - 5.1 mmol/L   Chloride 103 101 - 111 mmol/L   CO2 28 22 - 32 mmol/L   Glucose, Bld 98 65 - 99 mg/dL   BUN 14 6 - 20 mg/dL   Creatinine, Ser 0.82 0.44 - 1.00 mg/dL   Calcium 9.0 8.9 - 10.3 mg/dL   Total Protein 7.9 6.5 - 8.1 g/dL   Albumin 4.3 3.5 - 5.0 g/dL   AST 75 (H) 15 - 41 U/L   ALT 36 14 - 54 U/L   Alkaline Phosphatase 71 38 - 126 U/L   Total Bilirubin 1.2 0.3 - 1.2 mg/dL   GFR calc non Af Amer >60 >60 mL/min   GFR calc Af Amer >60 >60 mL/min   Anion gap 9 5 - 15  Ethanol (ETOH)  Result Value Ref Range   Alcohol, Ethyl (B) <5 <5 mg/dL  CBC  Result Value Ref Range   WBC 7.9 4.0 - 10.5 K/uL   RBC 4.15 3.87 - 5.11 MIL/uL   Hemoglobin 11.7 (L) 12.0 - 15.0 g/dL   HCT 35.9 (L) 36.0 - 46.0 %   MCV 86.5 78.0 - 100.0 fL   MCH 28.2 26.0 - 34.0 pg   MCHC 32.6 30.0 - 36.0 g/dL   RDW 15.4 11.5 - 15.5 %   Platelets 405 (H) 150 - 400 K/uL  Urinalysis, Routine w reflex microscopic  Result Value Ref Range   Color, Urine YELLOW YELLOW   APPearance CLOUDY (A) CLEAR   Specific Gravity, Urine 1.017 1.005 - 1.030   pH 6.5 5.0 - 8.0   Glucose, UA NEGATIVE NEGATIVE mg/dL   Hgb urine dipstick LARGE (A) NEGATIVE   Bilirubin Urine NEGATIVE NEGATIVE   Ketones, ur NEGATIVE NEGATIVE mg/dL   Protein, ur NEGATIVE NEGATIVE mg/dL   Urobilinogen, UA 0.2 0.0 - 1.0 mg/dL   Nitrite NEGATIVE NEGATIVE   Leukocytes, UA NEGATIVE NEGATIVE  Urine microscopic-add on  Result Value Ref Range   Squamous Epithelial / LPF FEW (A) RARE   WBC, UA 0-2 <3 WBC/hpf   RBC / HPF 3-6 <3 RBC/hpf   Bacteria, UA RARE RARE   Urine-Other MUCOUS PRESENT     Imaging Review No results found. I have personally reviewed and evaluated these images and lab results as part of my medical decision-making.   EKG Interpretation None      MDM   Final diagnoses:  None    1. Hypokalemia 2. Odd behavior  On further questioning, the patient becomes tearful and admits to a fit of anger  earlier - throwing things, yelling. She alludes to auditory hallucinations but does not confirm hearing voices or specific hallucination. Attempted to contact Romie Minus, who the patient said she was with when she became angry, but attempt unsuccessful. TTS consultation requested to determine patient's psychiatric stability for disposition.  Potassium replaced with IV supplementation.    Charlann Lange, PA-C  08/17/15 [place], DO 08/17/15 [id number]

## 2015-08-17 NOTE — Consult Note (Signed)
[facility] Face-to-Face Psychiatry Consult   Reason for Consult:  Schizoaffective disorder, Auditory Hallucination Referring Physician:  EDP Patient Identification: Sarah [name] MRN:  [id number] Principal Diagnosis: Schizoaffective disorder, depressive type ([place]) Diagnosis:   Patient Active Problem List   Diagnosis Date Noted  . Schizoaffective disorder, depressive type ([place]) [F25.1] 01/15/2012    Priority: High    Class: Acute    Total Time spent with patient: 45 minutes  Subjective:   Sarah [name] is a 39 y.o. female patient admitted with Schizoaffective disorder, Auditory Hallucination  HPI:  AA female, 39 years old with long hx of Psychiatric illness was evaluated today for disorganized behavior and thoughts.  Patient stated today that she came to the hospital because " People in my head brought me to the hospital, they touch me all over my body at times"  She admitted to not been on any medications for about 4-5 years and have not seen a Psychiatrist in a while.  Patient reports that something is really bothering her but could not explain what.  Patient reports she is responding to voices talking to her to calm herself down.  Patient is looking disheveled and unkempt and made minimal eye contact with providers.  She reports poor sleep and appetite.  She denies SI/HI.  She has been accepted for admission and we will be seeking placement at any facility with available beds.  Past Psychiatric History:  Depression, Schizophrenia and Bipolar disorder  Risk to Self: Suicidal Ideation: No Suicidal Intent: No Is patient at risk for suicide?: No Suicidal Plan?: No Access to Means: No What has been your use of drugs/alcohol within the last 12 months?: No alcohol or drug use reported.  How many times?: 1 Other Self Harm Risks: Pt denies  Triggers for Past Attempts: Unpredictable Intentional Self Injurious Behavior: None Risk to Others: Homicidal Ideation: No Thoughts of Harm to Others:  No Current Homicidal Intent: No Current Homicidal Plan: No Access to Homicidal Means: No Identified Victim: N/A History of harm to others?: No Assessment of Violence: On admission Violent Behavior Description: No violent behaviors observed.  Does patient have access to weapons?: Yes (Comment) (Knives ) Criminal Charges Pending?: No Does patient have a court date: No Prior Inpatient Therapy: Prior Inpatient Therapy: No Prior Therapy Dates: 2002, 2003, 2013 Prior Therapy Facilty/Provider(s): [facility] Reason for Treatment: Schizoaffective Disorder  Prior Outpatient Therapy: Prior Outpatient Therapy: Yes Prior Therapy Dates:  (Pt unable to recall) Prior Therapy Facilty/Provider(s): [facility] Reason for Treatment: Medication management  Does patient have an ACCT team?: No Does patient have Intensive In-House Services?  : No Does patient have [facility] services? : No Does patient have [facility] services?: No  Past Medical History:  Past Medical History  Diagnosis Date  . Hypertension   . Depression   . Anxiety    History reviewed. No pertinent past surgical history. Family History:  Family History  Problem Relation Age of Onset  . Anesthesia problems Neg Hx   . Malignant hyperthermia Neg Hx   . Pseudochol deficiency Neg Hx   . Hypotension Neg Hx    Family Psychiatric  History:  Unknown due to acuity of illness Social History:  History  Alcohol Use No     History  Drug Use No    Social History   Social History  . Marital Status: Single    Spouse Name: N/A  . Number of Children: N/A  . Years of Education: N/A   Social History Main Topics  .  Smoking status: Current Every Day Smoker -- 0.50 packs/day for 4 years    Types: Cigarettes  . Smokeless tobacco: None  . Alcohol Use: No  . Drug Use: No  . Sexual Activity: Not Asked   Other Topics Concern  . None   Social History Narrative   Additional Social History:    History of alcohol / drug use?:  (Pt denies; however UDS is  positive for THC. )                     Allergies:  No Known Allergies  Labs:  Results for orders placed or performed during the hospital encounter of 08/17/15 (from the past 48 hour(s))  Comprehensive metabolic panel     Status: Abnormal   Collection Time: 08/17/15  3:33 AM  Result Value Ref Range   Sodium 140 135 - 145 mmol/L   Potassium 2.7 (LL) 3.5 - 5.1 mmol/L    Comment: CRITICAL RESULT CALLED TO, READ BACK BY AND VERIFIED WITH: K WARD RN [id number] 08/17/15 A NAVARRO    Chloride 103 101 - 111 mmol/L   CO2 28 22 - 32 mmol/L   Glucose, Bld 98 65 - 99 mg/dL   BUN 14 6 - 20 mg/dL   Creatinine, Ser 0.82 0.44 - 1.00 mg/dL   Calcium 9.0 8.9 - 10.3 mg/dL   Total Protein 7.9 6.5 - 8.1 g/dL   Albumin 4.3 3.5 - 5.0 g/dL   AST 75 (H) 15 - 41 U/L   ALT 36 14 - 54 U/L   Alkaline Phosphatase 71 38 - 126 U/L   Total Bilirubin 1.2 0.3 - 1.2 mg/dL   GFR calc non Af Amer >60 >60 mL/min   GFR calc Af Amer >60 >60 mL/min    Comment: (NOTE) The eGFR has been calculated using the CKD EPI equation. This calculation has not been validated in all clinical situations. eGFR's persistently <60 mL/min signify possible Chronic Kidney Disease.    Anion gap 9 5 - 15  Ethanol (ETOH)     Status: None   Collection Time: 08/17/15  3:33 AM  Result Value Ref Range   Alcohol, Ethyl (B) <5 <5 mg/dL    Comment:        LOWEST DETECTABLE LIMIT FOR SERUM ALCOHOL IS 5 mg/dL FOR MEDICAL PURPOSES ONLY   CBC     Status: Abnormal   Collection Time: 08/17/15  3:33 AM  Result Value Ref Range   WBC 7.9 4.0 - 10.5 K/uL   RBC 4.15 3.87 - 5.11 MIL/uL   Hemoglobin 11.7 (L) 12.0 - 15.0 g/dL   HCT 35.9 (L) 36.0 - 46.0 %   MCV 86.5 78.0 - 100.0 fL   MCH 28.2 26.0 - 34.0 pg   MCHC 32.6 30.0 - 36.0 g/dL   RDW 15.4 11.5 - 15.5 %   Platelets 405 (H) 150 - 400 K/uL  Urine rapid drug screen (hosp performed) (Not at [facility])     Status: Abnormal   Collection Time: 08/17/15  5:44 AM  Result Value Ref Range   Opiates  NONE DETECTED NONE DETECTED   Cocaine NONE DETECTED NONE DETECTED   Benzodiazepines NONE DETECTED NONE DETECTED   Amphetamines NONE DETECTED NONE DETECTED   Tetrahydrocannabinol POSITIVE (A) NONE DETECTED   Barbiturates NONE DETECTED NONE DETECTED    Comment:        DRUG SCREEN FOR MEDICAL PURPOSES ONLY.  IF CONFIRMATION IS NEEDED FOR ANY PURPOSE, NOTIFY LAB WITHIN 5 DAYS.  LOWEST DETECTABLE LIMITS FOR URINE DRUG SCREEN Drug Class       Cutoff (ng/mL) Amphetamine      1000 Barbiturate      200 Benzodiazepine   355 Tricyclics       974 Opiates          300 Cocaine          300 THC              50   Urinalysis, Routine w reflex microscopic     Status: Abnormal   Collection Time: 08/17/15  5:44 AM  Result Value Ref Range   Color, Urine YELLOW YELLOW   APPearance CLOUDY (A) CLEAR   Specific Gravity, Urine 1.017 1.005 - 1.030   pH 6.5 5.0 - 8.0   Glucose, UA NEGATIVE NEGATIVE mg/dL   Hgb urine dipstick LARGE (A) NEGATIVE   Bilirubin Urine NEGATIVE NEGATIVE   Ketones, ur NEGATIVE NEGATIVE mg/dL   Protein, ur NEGATIVE NEGATIVE mg/dL   Urobilinogen, UA 0.2 0.0 - 1.0 mg/dL   Nitrite NEGATIVE NEGATIVE   Leukocytes, UA NEGATIVE NEGATIVE  Urine microscopic-add on     Status: Abnormal   Collection Time: 08/17/15  5:44 AM  Result Value Ref Range   Squamous Epithelial / LPF FEW (A) RARE   WBC, UA 0-2 <3 WBC/hpf   RBC / HPF 3-6 <3 RBC/hpf   Bacteria, UA RARE RARE   Urine-Other MUCOUS PRESENT   Pregnancy, urine     Status: None   Collection Time: 08/17/15  5:44 AM  Result Value Ref Range   Preg Test, Ur NEGATIVE NEGATIVE    Comment:        THE SENSITIVITY OF THIS METHODOLOGY IS >20 mIU/mL.     Current Facility-Administered Medications  Medication Dose Route Frequency Provider Last Rate Last Dose  . OLANZapine (ZYPREXA) tablet 5 mg  5 mg Oral BID PC Pressley Tadesse      . traZODone (DESYREL) tablet 100 mg  100 mg Oral QHS PRN Wissam Resor       Current Outpatient  Prescriptions  Medication Sig Dispense Refill  . acetaminophen (TYLENOL) 500 MG tablet Take 500 mg by mouth every 6 (six) hours as needed for headache.    . hydrochlorothiazide (HYDRODIURIL) 25 MG tablet Take 1 tablet (25 mg total) by mouth daily. 30 tablet 0  . ibuprofen (ADVIL,MOTRIN) 800 MG tablet Take 1 tablet (800 mg total) by mouth 3 (three) times daily. (Patient not taking: Reported on 08/13/2015) 21 tablet 0  . methocarbamol (ROBAXIN) 500 MG tablet Take 1 tablet (500 mg total) by mouth 2 (two) times daily. (Patient not taking: Reported on 08/13/2015) 20 tablet 0  . traMADol (ULTRAM) 50 MG tablet Take 1 tablet (50 mg total) by mouth every 6 (six) hours as needed. (Patient taking differently: Take 50 mg by mouth every 6 (six) hours as needed for moderate pain. ) 15 tablet 0    Musculoskeletal: Strength & Muscle Tone: within normal limits Gait & Station: normal Patient leans: N/A  Psychiatric Specialty Exam: Review of Systems  Constitutional: Negative.   HENT: Negative.   Eyes: Negative.   Respiratory: Negative.   Cardiovascular: Negative.   Gastrointestinal: Negative.   Genitourinary: Negative.   Musculoskeletal: Negative.   Skin: Negative.   Neurological: Negative.   Endo/Heme/Allergies: Negative.     Blood pressure 146/81, pulse 77, temperature 97.8 F (36.6 C), temperature source Oral, resp. rate 20, height _0  (1.651 m), weight 115.667 kg (255 lb), SpO2  100 %.Body mass index is 42.43 kg/(m^2).  General Appearance: Casual and Disheveled  Eye Contact::  Minimal  Speech:  Clear and Coherent and Slow  Volume:  Decreased  Mood:  Dysphoric  Affect:  Blunt and Flat  Thought Process:  Linear  Orientation:  Full (Time, Place, and Person)  Thought Content:  Hallucinations: Auditory Visual and Paranoid Ideation  Suicidal Thoughts:  No  Homicidal Thoughts:  No  Memory:  Immediate;   Poor Recent;   Poor Remote;   Poor  Judgement:  Impaired  Insight:  Shallow  Psychomotor  Activity:  Psychomotor Retardation  Concentration:  Fair  Recall:  NA  Fund of Knowledge:Fair  Language: Fair  Akathisia:  NA  Handed:  Right  AIMS (if indicated):     Assets:  Desire for Improvement  ADL's:  Impaired  Cognition: WNL  Sleep:      Treatment Plan Summary: Daily contact with patient to assess and evaluate symptoms and progress in treatment and Medication management  Disposition:   Admit and seek placement.  Start Olanzapine 5 mg po bid for mood control, Trazodone 100 mg po at bed time for sleep  Delfin Gant   PMHNP-BC 08/17/2015 12:47 PM Patient seen face-to-face for psychiatric evaluation, chart reviewed and case discussed with the physician extender and developed treatment plan. Reviewed the information documented and agree with the treatment plan. Corena Pilgrim, MD

## 2015-08-17 NOTE — Tx Team (Addendum)
Initial Interdisciplinary Treatment Plan    PATIENT STRESSORS: Educational concerns Financial difficulties Health problems   PATIENT STRENGTHS: Ability for insight Active sense of humor Average or above average intelligence     PROBLEM LIST: Problem List/Patient Goals Date to be addressed Date deferred Reason deferred Estimated date of resolution  Suicidal Ideation with Psychosis 08/17/15                 " Can you keep my dorritos?"      " I like it here"                               DISCHARGE CRITERIA:  Ability to meet basic life and health needs Adequate post-discharge living arrangements Improved stabilization in mood, thinking, and/or behavior Medical problems require only outpatient monitoring Motivation to continue treatment in a less acute level of care  PRELIMINARY DISCHARGE PLAN: Attend aftercare/continuing care group Attend PHP/IOP Attend 12-step recovery group Outpatient therapy Participate in family therapy  PATIENT/FAMIILY INVOLVEMENT: This treatment plan has been presented to and reviewed with the patient, Sarah [name], and/or family member,  The patient and family have been given the opportunity to ask questions and make suggestions.  [name] 08/17/2015, 5:50 PM

## 2015-08-17 NOTE — BH Assessment (Signed)
Tele Assessment Note   Sarah [name] is an 39 y.o. female presenting to [facility] accompanied by [employer]. Pt's family contacted [employer] due to pt getting into the shower with her clothes on, pacing the floor and talking to people that are not there. Pt stated" I was at my god sister house and I started tripping out, like bugging out". "I wanted to hurt people". Pt reported that whenever she is depressed she has thoughts to hurt people. PT denies HI and SI at this time. Pt reported she has attempted suicide in the past. Pt has been hospitalized multiple times in the past. Pt reported that she does not mental health provider at this time but shared that she has received treatment through [facility] in the past. Pt reported that she is dealing with multiple stressors such as financial and conflict between her and her biological mother and god mother. Pt reported that she would like for her god mother and biological mother to come together to help her. PT is endorsing multiple depressive symptoms but did not report any issues with her sleep or appetite. Pt did not report any drug or alcohol use; however her UDS is positive for marijuana. Pt did not report any pending criminal charges or upcoming court dates. Pt denied having access to firearms but shared that she does have access to weapons. PT did not report any physical, sexual or emotional abuse.  Inpatient treatment is recommended for stabilization.   Diagnosis: Schizoaffective Disorder, depressive type   Past Medical History:  Past Medical History  Diagnosis Date  . Hypertension   . Depression   . Anxiety     History reviewed. No pertinent past surgical history.  Family History:  Family History  Problem Relation Age of Onset  . Anesthesia problems Neg Hx   . Malignant hyperthermia Neg Hx   . Pseudochol deficiency Neg Hx   . Hypotension Neg Hx     Social History:  reports that she has been smoking Cigarettes.  She has a 2 pack-year smoking history. She  does not have any smokeless tobacco history on file. She reports that she does not drink alcohol or use illicit drugs.  Additional Social History:  Alcohol / Drug Use History of alcohol / drug use?:  (Pt denies; however UDS is positive for THC. )  CIWA: CIWA-Ar BP: (!) 176/102 mmHg Pulse Rate: 73 COWS:    PATIENT STRENGTHS: (choose at least two) Average or above average intelligence Capable of independent living  Allergies: No Known Allergies  Home Medications:  (Not in a hospital admission)  OB/GYN Status:  No LMP recorded.  General Assessment Data [facility]: [facility] T[facility] Assessment: In system Is this a Tele or Face-to-Face Assessment?: Face-to-Face Is this an Initial Assessment or a Re-assessment for this encounter?: Initial Assessment Marital status: Single Living Arrangements: Alone Can pt return to current living arrangement?: Yes Admission Status: Voluntary Is patient capable of signing voluntary admission?: Yes Referral Source: Self/Family/Friend Insurance type: Medicaid [place]      Crisis Care Plan Living Arrangements: Alone Name of Psychiatrist: No provider reported  Name of Therapist: No provider reported   Education Status Is patient currently in school?: No Current Grade: N/A Highest grade of school patient has completed: N/A Name of school: N/A Contact person: N/A  Risk to self with the past 6 months Suicidal Ideation: No Has patient been a risk to self within the past 6 months prior to admission? : No Suicidal Intent: No Has patient  had any suicidal intent within the past 6 months prior to admission? : No Is patient at risk for suicide?: No Suicidal Plan?: No Has patient had any suicidal plan within the past 6 months prior to admission? : No Access to Means: No What has been your use of drugs/alcohol within the last 12 months?: No alcohol or drug use reported.  Previous Attempts/Gestures: Yes How many times?: 1 Other Self Harm Risks: Pt  denies  Triggers for Past Attempts: Unpredictable Intentional Self Injurious Behavior: None Family Suicide History: No Recent stressful life event(s): Conflict (Comment), Financial Problems (Conflict with bio mom and godmother ) Persecutory voices/beliefs?: No Depression: Yes Depression Symptoms: Despondent, Tearfulness, Isolating, Guilt, Loss of interest in usual pleasures, Feeling worthless/self pity, Feeling angry/irritable Substance abuse history and/or treatment for substance abuse?: No Suicide prevention information given to non-admitted patients: Not applicable  Risk to Others within the past 6 months Homicidal Ideation: No Does patient have any lifetime risk of violence toward others beyond the six months prior to admission? : No Thoughts of Harm to Others: No Current Homicidal Intent: No Current Homicidal Plan: No Access to Homicidal Means: No Identified Victim: N/A History of harm to others?: No Assessment of Violence: On admission Violent Behavior Description: No violent behaviors observed.  Does patient have access to weapons?: Yes (Comment) (Knives ) Criminal Charges Pending?: No Does patient have a court date: No Is patient on probation?: No  Psychosis Hallucinations: Auditory Delusions: None noted  Mental Status Report Appearance/Hygiene: Unable to Assess Eye Contact: Good Motor Activity: Freedom of movement Speech: Logical/coherent, Soft Level of Consciousness: Quiet/awake Mood: Depressed Affect: Appropriate to circumstance Anxiety Level: Minimal Thought Processes: Coherent, Relevant Judgement: Unimpaired Orientation: Person, Situation, Time, Place Obsessive Compulsive Thoughts/Behaviors: None  Cognitive Functioning Concentration: Fair Memory: Recent Impaired, Remote Intact IQ: Average Insight: Fair Impulse Control: Fair Appetite: Good Weight Loss: 0 Weight Gain: 0 Sleep: No Change Total Hours of Sleep: 8 Vegetative Symptoms:  None  ADLScreening [facility] Services) Patient's cognitive ability adequate to safely complete daily activities?: Yes Patient able to express need for assistance with ADLs?: Yes Independently performs ADLs?: Yes (appropriate for developmental age)  Prior Inpatient Therapy Prior Inpatient Therapy: No Prior Therapy Dates: 2002, 2003, 2013 Prior Therapy Facilty/Provider(s): [facility] Reason for Treatment: Schizoaffective Disorder   Prior Outpatient Therapy Prior Outpatient Therapy: Yes Prior Therapy Dates:  (Pt unable to recall) Prior Therapy Facilty/Provider(s): [facility] Reason for Treatment: Medication management  Does patient have an ACCT team?: No Does patient have Intensive In-House Services?  : No Does patient have [facility] services? : No Does patient have P4CC services?: No  ADL Screening (condition at time of admission) Patient's cognitive ability adequate to safely complete daily activities?: Yes Is the patient deaf or have difficulty hearing?: No Does the patient have difficulty seeing, even when wearing glasses/contacts?: No Does the patient have difficulty concentrating, remembering, or making decisions?: No Patient able to express need for assistance with ADLs?: Yes Does the patient have difficulty dressing or bathing?: No Independently performs ADLs?: Yes (appropriate for developmental age)       Abuse/Neglect Assessment (Assessment to be complete while patient is alone) Physical Abuse: Denies Verbal Abuse: Denies Sexual Abuse: Denies Exploitation of patient/patient's resources: Denies Self-Neglect: Denies     Regulatory affairs officer (For Healthcare) Does patient have an advance directive?: No Would patient like information on creating an advanced directive?: No - patient declined information    Additional Information 1:1 In Past 12 Months?: No CIRT Risk: No Elopement  Risk: No Does patient have medical clearance?: No (Abnormal labs )     Disposition:   Disposition Initial Assessment Completed for this Encounter: Yes Disposition of Patient: Inpatient treatment program Type of inpatient treatment program: Adult  Steed Kanaan S 08/17/2015 6:43 AM

## 2015-08-17 NOTE — BH Assessment (Signed)
Assessment completed. Consulted Patriciaann Clan, PA-C who recommended inpatient treatment. TTS to seek placement. Informed Charlann Lange, PA-C of recommendation.

## 2015-08-17 NOTE — BH Assessment (Signed)
[place] Assessment Progress Note  Pt accepted to [facility] 502-2 per [place], [facility]. Voluntary paperwork and consent form signed and faxed to [facility]. Pt can be transported by [employer].

## 2015-08-17 NOTE — Progress Notes (Signed)
Sarah [name] is a  VERY CONFUSED  39 yo obese female who is admitted to this hospital from the ED at [facility]. She is disjointed. She gets up and walks around the search room during her admission. She deneis feeeling suicidal but has a bizarre appearance. She cannot remember medication names but is able toa rticulate she lives by herslef. She deneis SI, HI. She is probably responding to internal stimuli. Admssion complete.

## 2015-08-17 NOTE — Progress Notes (Signed)
Pt attended and participated in evening karaoke group.

## 2015-08-17 NOTE — ED Notes (Signed)
Called [facility] for pt transport to [facility]

## 2015-08-17 NOTE — Progress Notes (Signed)
Patient ID: Sarah [name], female   DOB: [birth year], 39 y.o.   MRN: [id number] D: Client visible on the unit, in dayroom, watching TV, interacting with peers, somewhat intrusive. Client reports depression "6" of 10. "I came from the ED with pain and hallucinations" "I take HCTZ at home" "I never hear voices" "I like this place, it's better, nicer, sweeter" client reports she was out of medicine. A: Writer encourage client to take medication and report if any side effects. Client encouraged to attend karaoke. Staff will monitor q73min for safety. R: Client is safe on the unit, attended karaoke, when up to sing and mouth was moving but no words coming out. At one point client became paranoid and verbalized concerns of peers at the table talking about her. Client had to be reassured and moved to another table.

## 2015-08-18 ENCOUNTER — Encounter (HOSPITAL_COMMUNITY): Payer: Self-pay | Admitting: Psychiatry

## 2015-08-18 DIAGNOSIS — F79 Unspecified intellectual disabilities: Secondary | ICD-10-CM

## 2015-08-18 DIAGNOSIS — F250 Schizoaffective disorder, bipolar type: Principal | ICD-10-CM

## 2015-08-18 DIAGNOSIS — E876 Hypokalemia: Secondary | ICD-10-CM | POA: Diagnosis present

## 2015-08-18 DIAGNOSIS — F1220 Cannabis dependence, uncomplicated: Secondary | ICD-10-CM

## 2015-08-18 MED ORDER — HYDROCHLOROTHIAZIDE 25 MG PO TABS
25.0000 mg | ORAL_TABLET | Freq: Every day | ORAL | Status: DC
  Administered 2015-08-18 – 2015-08-24 (×7): 25 mg via ORAL
  Filled 2015-08-18 (×5): qty 1
  Filled 2015-08-18: qty 2
  Filled 2015-08-18 (×6): qty 1

## 2015-08-18 MED ORDER — LORAZEPAM 1 MG PO TABS
ORAL_TABLET | ORAL | Status: AC
  Filled 2015-08-18: qty 1

## 2015-08-18 MED ORDER — PNEUMOCOCCAL 13-VAL CONJ VACC IM SUSP
0.5000 mL | INTRAMUSCULAR | Status: AC
  Administered 2015-08-19: 0.5 mL via INTRAMUSCULAR
  Filled 2015-08-18: qty 0.5

## 2015-08-18 MED ORDER — INFLUENZA VAC SPLIT QUAD 0.5 ML IM SUSY
0.5000 mL | PREFILLED_SYRINGE | INTRAMUSCULAR | Status: AC
  Administered 2015-08-18: 0.5 mL via INTRAMUSCULAR
  Filled 2015-08-18: qty 0.5

## 2015-08-18 MED ORDER — OLANZAPINE 10 MG PO TBDP
10.0000 mg | ORAL_TABLET | Freq: Three times a day (TID) | ORAL | Status: DC | PRN
Start: 2015-08-18 — End: 2015-08-24
  Administered 2015-08-19 – 2015-08-21 (×3): 10 mg via ORAL
  Filled 2015-08-18 (×3): qty 1

## 2015-08-18 MED ORDER — LORAZEPAM 1 MG PO TABS
1.0000 mg | ORAL_TABLET | Freq: Once | ORAL | Status: AC
  Administered 2015-08-18: 1 mg via ORAL

## 2015-08-18 MED ORDER — LAMOTRIGINE 25 MG PO TABS
25.0000 mg | ORAL_TABLET | Freq: Every day | ORAL | Status: DC
  Administered 2015-08-18 – 2015-08-22 (×5): 25 mg via ORAL
  Filled 2015-08-18 (×8): qty 1

## 2015-08-18 MED ORDER — ARIPIPRAZOLE 5 MG PO TABS
5.0000 mg | ORAL_TABLET | Freq: Every day | ORAL | Status: DC
  Administered 2015-08-18: 5 mg via ORAL
  Filled 2015-08-18 (×4): qty 1

## 2015-08-18 MED ORDER — HYDROXYZINE HCL 25 MG PO TABS
25.0000 mg | ORAL_TABLET | Freq: Four times a day (QID) | ORAL | Status: DC | PRN
  Filled 2015-08-18: qty 6

## 2015-08-18 MED ORDER — OLANZAPINE 10 MG IM SOLR: 10.0000 mg | Freq: Three times a day (TID) | INTRAMUSCULAR | Status: DC | PRN

## 2015-08-18 MED ORDER — INFLUENZA VAC SPLIT QUAD 0.5 ML IM SUSY
0.5000 mL | PREFILLED_SYRINGE | INTRAMUSCULAR | Status: DC
  Filled 2015-08-18: qty 0.5

## 2015-08-18 MED ORDER — POTASSIUM CHLORIDE CRYS ER 20 MEQ PO TBCR
20.0000 meq | EXTENDED_RELEASE_TABLET | Freq: Two times a day (BID) | ORAL | Status: DC
  Administered 2015-08-18: 20 meq via ORAL
  Filled 2015-08-18 (×3): qty 1

## 2015-08-18 NOTE — Progress Notes (Signed)
DAR NOTE: Patient presents with anxious affect and depressed mood.  Denies pain, auditory and visual hallucinations.  Rates depression at 9, hopelessness at 7, and anxiety at 0.  Maintained on routine safety checks.  Medications given as prescribed.  Support and encouragement offered as needed.  Attended group and participated.    Patient observed socializing with peers in the dayroom.  Offered no complaint.
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
## 2015-08-18 NOTE — Tx Team (Signed)
Interdisciplinary Treatment Plan Update (Adult)  Date:  08/18/2015 Time Reviewed:  11:23 AM  Progress in Treatment: Attending groups: Yes. Participating in groups: Yes. Taking medication as prescribed:  Yes. Tolerating medication:  Yes. Family/Significant othe contact made: Yes Patient understands diagnosis:  No Limited insight Discussing patient identified problems/goals with staff:  Yes, see initial care plan. Medical problems stabilized or resolved:  Yes Denies suicidal/homicidal ideation: Yes. Issues/concerns per patient self-inventory: No. Other:  New problem(s) identified:   Discharge Plan or Barriers: See below  Reason for Continuation of Hospitalization: Depression Hallucinations Medication stabilization Suicidal ideation  Comments: AA female, 39 years old with long hx of Psychiatric illness was evaluated today for disorganized behavior and thoughts. Patient stated today that she came to the hospital because " People in my head brought me to the hospital, they touch me all over my body at times" She admitted to not been on any medications for about 4-5 years and have not seen a Psychiatrist in a while. Patient reports that something is really bothering her but could not explain what. Patient reports she is responding to voices talking to her to calm herself down. Patient is looking disheveled and unkempt and made minimal eye contact with providers. She reports poor sleep and appetite. She denies SI/HI. She has been accepted for admission and we will be seeking placement at any facility with available beds. Abilify, Lamictal trial   Estimated length of stay: 4-5 days  New goal(s):  Review of initial/current patient goals per problem list:  1. Goal(s): Patient will participate in aftercare plan  Met:No   Target date: at discharge  As evidenced by: Patient will participate within aftercare plan AEB aftercare provider and housing plan at discharge being  identified. 08/18/15: Discharge plan unknown. CSW still assessing   2. Goal (s): Patient will exhibit decreased depressive symptoms and suicidal ideations.  Met:Yes  Target date: at discharge  As evidenced by: Patient will utilize self rating of depression at 3 or below and demonstrate decreased signs of depression or be deemed stable for discharge by MD. 08/18/15: Pt currently denies SI however still presents with a depressed mood and affect. Pt rates depression 6/10 today.   4. Goal(s): Patient will demonstrate decreased signs of psychosis.  Met: No  Target date:at discharge  As evidenced by: Patient will demonstrate decreased signs of psychosis as evidenced by a reduction in AVH, paranoia, and/or delusions.   08/18/15: Pt denies AVH but appears to be responding to internal stimuli as evidenced by staring intently, laughing to herself, and whispering to herself.     Attendees: Patient:  08/18/2015 11:23 AM  Family:   08/18/2015 11:23 AM  Physician:  Dr. Ursula Alert, MD 08/18/2015 11:23 AM  Nursing: Julienne Kass, RN   08/18/2015 11:23 AM  Case Manager:  Roque Lias, LCSW 08/18/2015 11:23 AM  Counselor:  Matthew Saras, MSW Intern 08/18/2015 11:23 AM  Other:   08/18/2015 11:23 AM  Other:   08/18/2015 11:23 AM  Other:   08/18/2015 11:23 AM  Other:  08/18/2015 11:23 AM  Other:    Other:    Other:    Other:    Other:    Other:      Scribe for Treatment Team:   Georga Kaufmann, MSW Intern 08/18/2015 11:23 AM

## 2015-08-18 NOTE — BHH Group Notes (Signed)
[facility] LCSW Aftercare Discharge Planning Group Note   08/18/2015 10:25 AM  Participation Quality: Active  Mood/Affect:  Appropriate  Depression Rating:  Pt denies   Anxiety Rating: Pt denies   Thoughts of Suicide:  No Will you contract for safety?   NA  Current AVH:  No  Plan for Discharge/Comments:  Pt states that she came to the hospital because her depression "was getting really bad". However, when asked, pt denied all symptoms. During group pt seemed to be responding to internal stimuli as evidenced by staring intently, laughing to herself, and whispering to herself.  Transportation Means:   Supports:  Sarah [name]
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
## 2015-08-18 NOTE — BHH Suicide Risk Assessment (Signed)
[facility] Admission Suicide Risk Assessment   Nursing information obtained from:    Demographic factors:    Current Mental Status:    Loss Factors:    Historical Factors:    Risk Reduction Factors:    Total Time spent with patient: 30 minutes Principal Problem: Schizoaffective disorder, bipolar type ([place]) Diagnosis:   Patient Active Problem List   Diagnosis Date Noted  . Intellectual disability [F79] 08/18/2015  . Cannabis use disorder, moderate, dependence ([place]) [F12.20] 08/18/2015  . Schizoaffective disorder, bipolar type ([place]) [F25.0] 08/17/2015     Continued Clinical Symptoms:  Alcohol Use Disorder Identification Test Final Score (AUDIT): 0 The "Alcohol Use Disorders Identification Test", Guidelines for Use in Primary Care, Second Edition.  World Pharmacologist [facility]). Score between 0-7:  no or low risk or alcohol related problems. Score between 8-15:  moderate risk of alcohol related problems. Score between 16-19:  high risk of alcohol related problems. Score 20 or above:  warrants further diagnostic evaluation for alcohol dependence and treatment.   CLINICAL FACTORS:   Alcohol/Substance Abuse/Dependencies Unstable or Poor Therapeutic Relationship Previous Psychiatric Diagnoses and Treatments   Musculoskeletal: Strength & Muscle Tone: within normal limits Gait & Station: normal Patient leans: N/A  Psychiatric Specialty Exam: Physical Exam  Review of Systems  Psychiatric/Behavioral: The patient is nervous/anxious and has insomnia.   All other systems reviewed and are negative.   Blood pressure 134/89, pulse 93, temperature 98.4 F (36.9 C), temperature source Oral, resp. rate 16, height 5\' 2"  (1.575 m), weight 125.193 kg (276 lb), SpO2 100 %.Body mass index is 50.47 kg/(m^2).                      Please see H&P.                                    COGNITIVE FEATURES THAT CONTRIBUTE TO RISK:  Closed-mindedness, Polarized thinking and  Thought constriction (tunnel vision)    SUICIDE RISK:   Mild:  Suicidal ideation of limited frequency, intensity, duration, and specificity.  There are no identifiable plans, no associated intent, mild dysphoria and related symptoms, good self-control (both objective and subjective assessment), few other risk factors, and identifiable protective factors, including available and accessible social support.  PLAN OF CARE: Please see H&P.   Medical Decision Making:  Review of Psycho-Social Stressors (1), Review or order clinical lab tests (1), Review and summation of old records (2), Established Problem, Worsening (2), Review of Last Therapy Session (1), Review or order medicine tests (1), Review of Medication Regimen & Side Effects (2) and Review of New Medication or Change in Dosage (2)  I certify that inpatient services furnished can reasonably be expected to improve the patient's condition.   Taksh Hjort MD 08/18/2015, 12:51 PM

## 2015-08-18 NOTE — Progress Notes (Signed)
Patient ID: Sarah [name], female   DOB: [birth year], 39 y.o.   MRN: [id number] D: Client reports "I been doing everything they tell me, I been going to group" Client says she was admitted to ED "I was sick, they gave me a bag of fluid and gave me that HCTZ" Client ask for a blue folder, "they didn't give me one, I need one to put my papers and stuff" "I need to be here, to be good to get better"  Client reports today's goal met "to interact with people" A: Writer provided emotional support encouraged client to continue to attend group and take medications. Medications reviewed and administered as scheduled. Staff will monitor q84mn for safety. R:Client is safe on the unit, attended group.

## 2015-08-18 NOTE — H&P (Addendum)
Psychiatric Admission Assessment Adult  Patient Identification: Sarah [name] MRN:  [id number] Date of Evaluation:  08/18/2015 Chief Complaint:  Patient states " I was not feeling depressed or seeing things - they said I was."    Principal Diagnosis: Schizoaffective disorder, bipolar type ([place]) Diagnosis:   Patient Active Problem List   Diagnosis Date Noted  . Intellectual disability [F79] 08/18/2015  . Cannabis use disorder, moderate, dependence ([place]) [F12.20] 08/18/2015  . Hypokalemia [E87.6] 08/18/2015  . Schizoaffective disorder, bipolar type ([place]) [F25.0] 08/17/2015      History of Present Illness::Sarah [name] is a 39 y.o. 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 Pt's family contacted [employer] due to pt getting into the shower with her clothes on, pacing the floor and talking to people that are not there.   Per initial notes in EHR " Pt stated that she was at her God sister's house and she started 'tripping out " and was having thoughts about hurting people.   Patient seen today and chart reviewed.Discussed patient with treatment team. Pt seen in her room , as calm , cooperative. Pt reports she does not know why she was brought to the hospital. Pt reports she does have sleep issues and does feel depressed at times. She reports that she has mood lability and she destroys her own property when she is irritable . Pt reports that she has not heard or seen things in a long time and that even if she were seeing things - it was nothing other people could not see. Pt denies any anxiety sx. Pt denies hx of sexual or physical abuse.  Pt reports being on psychotropic medications in the past. Pt reports she has not been compliant on them. She was on Abilify in the past and is willing to be restarted on it. She has also tried Depakote in the past , however she feels  that she did not tolerate it well and wants to try something else.  Pt denies any SI or HI at this time.  However as observed by nursing - pt has been disorganized since admission - is seen as responding to internal stimuli as well as as having labile mood .    Associated Signs/Symptoms: Depression Symptoms:  depressed mood, anhedonia, insomnia, difficulty concentrating, anxiety, (Hypo) Manic Symptoms:  Distractibility, Impulsivity, Irritable Mood, Labiality of Mood, Anxiety Symptoms:  denies Psychotic Symptoms:  Hallucinations: Auditory Visual PTSD Symptoms: Negative Total Time spent with patient: 45 minutes  Past Psychiatric History: Patient has a hx of schizoaffective disorder as well as Mild ID , had a legal guardian in the past, unknown if she does have now. Pt has been admitted at [facility] in 2003, 2013, 2012. Pt denies any previous suicide attempts.  Risk to Self: Is patient at risk for suicide?: No Risk to Others:   Prior Inpatient Therapy:   Prior Outpatient Therapy:    Alcohol Screening: 1. How often do you have a drink containing alcohol?: Never 9. Have you or someone else been injured as a result of your drinking?: No 10. Has a relative or friend or a doctor or another health worker been concerned about your drinking or suggested you cut down?: No Alcohol Use Disorder Identification Test Final Score (AUDIT): 0 Brief Intervention: Patient declined brief intervention Substance Abuse History in the last 12 months:  Yes.  cannabis abuse  Consequences of Substance Abuse: Negative Previous Psychotropic Medications: Yes - abilify, depakote ( she did not like it ) Psychological Evaluations: Unknown Past Medical History:  Past Medical History  Diagnosis Date  . Hypertension   . Depression   . Anxiety     Family History:  Family History  Problem Relation Age of Onset  . Anesthesia problems Neg Hx   . Malignant hyperthermia Neg Hx   . Pseudochol deficiency Neg Hx    . Hypotension Neg Hx   . Schizophrenia Mother   . Hypertension Mother    Family Psychiatric  History: Pt reports that her mother has a hx of schizophrenia. Pt denies any hx of alcohol abuse , drug abuse , suicide in family. Social History: Pt is single , lives in [place] by self , denies having any children , reports she is on SSD. Pt denies any pending legal charges. History  Alcohol Use No     History  Drug Use  . Yes  . Special: Cocaine, Marijuana    Social History   Social History  . Marital Status: Single    Spouse Name: N/A  . Number of Children: N/A  . Years of Education: N/A   Social History Main Topics  . Smoking status: Current Every Day Smoker -- 0.50 packs/day for 4 years    Types: Cigarettes  . Smokeless tobacco: None  . Alcohol Use: No  . Drug Use: Yes    Special: Cocaine, Marijuana  . Sexual Activity: Not Asked   Other Topics Concern  . None   Social History Narrative   Additional Social History:                         Allergies:  No Known Allergies Lab Results:  Results for orders placed or performed during the hospital encounter of 08/17/15 (from the past 48 hour(s))  Comprehensive metabolic panel     Status: Abnormal   Collection Time: 08/17/15  3:33 AM  Result Value Ref Range   Sodium 140 135 - 145 mmol/L   Potassium 2.7 (LL) 3.5 - 5.1 mmol/L    Comment: CRITICAL RESULT CALLED TO, READ BACK BY AND VERIFIED WITH: K WARD RN [id number] 08/17/15 A NAVARRO    Chloride 103 101 - 111 mmol/L   CO2 28 22 - 32 mmol/L   Glucose, Bld 98 65 - 99 mg/dL   BUN 14 6 - 20 mg/dL   Creatinine, Ser 0.82 0.44 - 1.00 mg/dL   Calcium 9.0 8.9 - 10.3 mg/dL   Total Protein 7.9 6.5 - 8.1 g/dL   Albumin 4.3 3.5 - 5.0 g/dL   AST 75 (H) 15 - 41 U/L   ALT 36 14 - 54 U/L   Alkaline Phosphatase 71 38 - 126 U/L   Total Bilirubin 1.2 0.3 - 1.2 mg/dL   GFR calc non Af Amer >60 >60 mL/min   GFR calc Af Amer >60 >60 mL/min    Comment: (NOTE) The eGFR has been  calculated using the CKD EPI equation. This calculation has not been validated in all clinical situations. eGFR's persistently <60 mL/min signify possible Chronic Kidney Disease.    Anion gap 9 5 - 15  Ethanol (ETOH)     Status: None   Collection Time: 08/17/15  3:33 AM  Result Value Ref Range   Alcohol, Ethyl (B) <5 <5 mg/dL    Comment:        LOWEST DETECTABLE LIMIT FOR SERUM ALCOHOL  IS 5 mg/dL FOR MEDICAL PURPOSES ONLY   CBC     Status: Abnormal   Collection Time: 08/17/15  3:33 AM  Result Value Ref Range   WBC 7.9 4.0 - 10.5 K/uL   RBC 4.15 3.87 - 5.11 MIL/uL   Hemoglobin 11.7 (L) 12.0 - 15.0 g/dL   HCT 35.9 (L) 36.0 - 46.0 %   MCV 86.5 78.0 - 100.0 fL   MCH 28.2 26.0 - 34.0 pg   MCHC 32.6 30.0 - 36.0 g/dL   RDW 15.4 11.5 - 15.5 %   Platelets 405 (H) 150 - 400 K/uL  Urine rapid drug screen (hosp performed) (Not at [facility])     Status: Abnormal   Collection Time: 08/17/15  5:44 AM  Result Value Ref Range   Opiates NONE DETECTED NONE DETECTED   Cocaine NONE DETECTED NONE DETECTED   Benzodiazepines NONE DETECTED NONE DETECTED   Amphetamines NONE DETECTED NONE DETECTED   Tetrahydrocannabinol POSITIVE (A) NONE DETECTED   Barbiturates NONE DETECTED NONE DETECTED    Comment:        DRUG SCREEN FOR MEDICAL PURPOSES ONLY.  IF CONFIRMATION IS NEEDED FOR ANY PURPOSE, NOTIFY LAB WITHIN 5 DAYS.        LOWEST DETECTABLE LIMITS FOR URINE DRUG SCREEN Drug Class       Cutoff (ng/mL) Amphetamine      1000 Barbiturate      200 Benzodiazepine   761 Tricyclics       607 Opiates          300 Cocaine          300 THC              50   Urinalysis, Routine w reflex microscopic     Status: Abnormal   Collection Time: 08/17/15  5:44 AM  Result Value Ref Range   Color, Urine YELLOW YELLOW   APPearance CLOUDY (A) CLEAR   Specific Gravity, Urine 1.017 1.005 - 1.030   pH 6.5 5.0 - 8.0   Glucose, UA NEGATIVE NEGATIVE mg/dL   Hgb urine dipstick LARGE (A) NEGATIVE   Bilirubin Urine  NEGATIVE NEGATIVE   Ketones, ur NEGATIVE NEGATIVE mg/dL   Protein, ur NEGATIVE NEGATIVE mg/dL   Urobilinogen, UA 0.2 0.0 - 1.0 mg/dL   Nitrite NEGATIVE NEGATIVE   Leukocytes, UA NEGATIVE NEGATIVE  Urine microscopic-add on     Status: Abnormal   Collection Time: 08/17/15  5:44 AM  Result Value Ref Range   Squamous Epithelial / LPF FEW (A) RARE   WBC, UA 0-2 <3 WBC/hpf   RBC / HPF 3-6 <3 RBC/hpf   Bacteria, UA RARE RARE   Urine-Other MUCOUS PRESENT   Pregnancy, urine     Status: None   Collection Time: 08/17/15  5:44 AM  Result Value Ref Range   Preg Test, Ur NEGATIVE NEGATIVE    Comment:        THE SENSITIVITY OF THIS METHODOLOGY IS >20 mIU/mL.     Metabolic Disorder Labs:  No results found for: HGBA1C, MPG No results found for: PROLACTIN No results found for: CHOL, TRIG, HDL, CHOLHDL, VLDL, LDLCALC  Current Medications: Current Facility-Administered Medications  Medication Dose Route Frequency Provider Last Rate Last Dose  . acetaminophen (TYLENOL) tablet 650 mg  650 mg Oral Q6H PRN Delfin Gant, NP      . alum & mag hydroxide-simeth (MAALOX/MYLANTA) 200-200-20 MG/5ML suspension 30 mL  30 mL Oral Q4H PRN Delfin Gant, NP      .  ARIPiprazole (ABILIFY) tablet 5 mg  5 mg Oral QHS Lucresha Dismuke, MD      . hydrochlorothiazide (HYDRODIURIL) tablet 25 mg  25 mg Oral Daily Avonda Toso, MD      . hydrOXYzine (ATARAX/VISTARIL) tablet 25 mg  25 mg Oral Q6H PRN Ursula Alert, MD      . lamoTRIgine (LAMICTAL) tablet 25 mg  25 mg Oral Daily Skye Plamondon, MD   25 mg at 08/18/15 1216  . magnesium hydroxide (MILK OF MAGNESIA) suspension 30 mL  30 mL Oral Daily PRN Delfin Gant, NP      . OLANZapine zydis (ZYPREXA) disintegrating tablet 10 mg  10 mg Oral TID PRN Ursula Alert, MD       Or  . OLANZapine (ZYPREXA) injection 10 mg  10 mg Intramuscular TID PRN Ursula Alert, MD      . Derrill Memo ON 08/19/2015] pneumococcal 13-valent conjugate vaccine (PREVNAR 13) injection  0.5 mL  0.5 mL Intramuscular Tomorrow-1000 Ursula Alert, MD      . traZODone (DESYREL) tablet 100 mg  100 mg Oral QHS PRN Delfin Gant, NP   100 mg at 08/17/15 2208   PTA Medications: Prescriptions prior to admission  Medication Sig Dispense Refill Last Dose  . hydrochlorothiazide (HYDRODIURIL) 25 MG tablet Take 1 tablet (25 mg total) by mouth daily. 30 tablet 0 08/16/2015 at Unknown time    Musculoskeletal: Strength & Muscle Tone: within normal limits Gait & Station: normal Patient leans: N/A  Psychiatric Specialty Exam: Physical Exam  Constitutional:  I concur with PE done in ED    Review of Systems  Psychiatric/Behavioral: Positive for substance abuse. The patient is nervous/anxious and has insomnia.   All other systems reviewed and are negative.   Blood pressure 134/89, pulse 93, temperature 98.4 F (36.9 C), temperature source Oral, resp. rate 16, height '5\' 2"'  (1.575 m), weight 125.193 kg (276 lb), SpO2 100 %.Body mass index is 50.47 kg/(m^2).  General Appearance: Disheveled  Eye Sport and exercise psychologist::  Fair  Speech:  Normal Rate  Volume:  Decreased  Mood:  Anxious  Affect:  Labile  Thought Process:  Linear  Orientation:  Other:  person , place  Thought Content:  Paranoid Ideation and seen as responding to internal stimuli  Suicidal Thoughts:  No  Homicidal Thoughts:  No  Memory:  Immediate;   Fair Recent;   Poor Remote;   Poor  Judgement:  Impaired  Insight:  Lacking  Psychomotor Activity:  Decreased  Concentration:  Poor  Recall:  [place] of Knowledge:Fair  Language: Fair  Akathisia:  No  Handed:  Right  AIMS (if indicated):     Assets:  Desire for Improvement  ADL's:  Impaired  Cognition: WNL  Sleep:  Number of Hours: 6.25     Assessment/ Treatment Plan Summary: Patient is a 59 y old AAF who has a hx of schizoaffective disorder, Mild ID, Cannabis use disorder , who has been noncompliant on her medications. Pt observed as disorganized, responding to  internal stimuli . Will continue inpatient stabilization.  Daily contact with patient to assess and evaluate symptoms and progress in treatment and Medication management   Patient will benefit from inpatient treatment and stabilization.  Estimated length of stay is 5-7 days.  Reviewed past medical records,treatment plan.  Will start Abilify 5 mg po qhs for psychosis.Plan to DC patient on LAI. Will add Lamictal 25 mg po daily for mood sx. Will add Cogentin 0.5 mg po qhs for EPS. Will add  Trazodone 100 mg po qhs for sleep. Will add Zyprexa 10 mg tid prn for severe agitation. Will continue to monitor vitals ,medication compliance and treatment side effects while patient is here.  Will monitor for medical issues as well as call consult as needed.  Patient with Hypokalemia - will replace with Kdur 20 meq x 1 dose and repeat BMP tomorrow AM. Reviewed labs AST elevated , K+ low , Hb/hct -low , UDS - pos for THC , BAL ,5 , Urine pregnancy - negative , EKG - qtc - wnl  ,will order TSH ,lipid panel, PL, hba1c, as well as BMP. CSW will start working on disposition.  Patient to participate in therapeutic milieu .       Observation Level/Precautions:  15 minute checks    Psychotherapy:  Individual and group therapy     Consultations:  Social worker  Discharge Concerns:stability and safety         I certify that inpatient services furnished can reasonably be expected to improve the patient's condition.   Payzlee Ryder MD 11/11/20161:19 PM

## 2015-08-18 NOTE — [facility] Group Notes (Signed)
[place] LCSW Group Therapy   08/18/2015 1:28 PM  Type of Therapy: Group Therapy  Participation Level:  Active  Participation Quality:  Attentive  Affect:  Flat  Cognitive:  Oriented  Insight:  Limited  Engagement in Therapy:  Engaged  Modes of Intervention:  Discussion and Socialization  Summary of Progress/Problems: Chaplain was here to lead a group on themes of hope and/or courage.   Pt was pleasant and oriented throughout the group although she did take a short cat nap. "I show love for the people in my family that have died by getting a tattoo" Pt also spoke about experiencing love within the hospital because she has been able to find people that she can relate to.  Georga Kaufmann 08/18/2015 1:28 PM

## 2015-08-19 LAB — BASIC METABOLIC PANEL
Anion gap: 10 (ref 5–15)
BUN: 10 mg/dL (ref 6–20)
CO2: 26 mmol/L (ref 22–32)
Calcium: 9.4 mg/dL (ref 8.9–10.3)
Chloride: 105 mmol/L (ref 101–111)
Creatinine, Ser: 0.71 mg/dL (ref 0.44–1.00)
GFR calc Af Amer: >60 mL/min (ref >60)
GFR calc non Af Amer: >60 mL/min (ref >60)
Glucose, Bld: 98 mg/dL (ref 65–99)
Potassium: 3.3 mmol/L — ABNORMAL LOW (ref 3.5–5.1)
Sodium: 141 mmol/L (ref 135–145)

## 2015-08-19 LAB — LIPID PANEL
Cholesterol: 141 mg/dL (ref 0–200)
HDL: 52 mg/dL (ref >40)
LDL Cholesterol: 72 mg/dL (ref 0–99)
Total CHOL/HDL Ratio: 2.7 RATIO
Triglycerides: 83 mg/dL (ref <150)
VLDL: 17 mg/dL (ref 0–40)

## 2015-08-19 LAB — TSH: TSH: 0.892 u[IU]/mL (ref 0.350–4.500)

## 2015-08-19 MED ORDER — SALINE SPRAY 0.65 % NA SOLN
1.0000 | Freq: Three times a day (TID) | NASAL | Status: DC
  Administered 2015-08-19 – 2015-08-24 (×19): 1 via NASAL
  Filled 2015-08-19 (×2): qty 44

## 2015-08-19 MED ORDER — POTASSIUM CHLORIDE CRYS ER 10 MEQ PO TBCR
10.0000 meq | EXTENDED_RELEASE_TABLET | Freq: Two times a day (BID) | ORAL | Status: AC
  Administered 2015-08-19 – 2015-08-21 (×4): 10 meq via ORAL
  Filled 2015-08-19 (×6): qty 1

## 2015-08-19 MED ORDER — ARIPIPRAZOLE 10 MG PO TABS
10.0000 mg | ORAL_TABLET | Freq: Every day | ORAL | Status: DC
  Administered 2015-08-19 – 2015-08-21 (×3): 10 mg via ORAL
  Filled 2015-08-19 (×6): qty 1

## 2015-08-19 NOTE — [facility] Group Notes (Signed)
[place] Group Notes:  (Clinical Social Work)  08/19/2015  11:15-12:00PM  Summary of Progress/Problems:   Today's process group involved patients discussing their feelings related to being hospitalized, as well as how they want to feel in order to be ready to discharge.  It was agreed in general by the group that it would be preferable to avoid future hospitalizations, and there was a discussion about what each person will need to do to achieve that.   Problems related to adherence to medication recommendations were discussed, as well as importance of developing friendships and supports.  The patient expressed her primary feeling about being hospitalized is that at first it was good and she felt it was helpful, but after the incident last night, she has been upset and knows she can be all right at home.  She stated that she is now getting "bad vibes from people."  She was very participatory and positive in group, however.  Type of Therapy:  Group Therapy - Process  Participation Level:  Active  Participation Quality:  Attentive and Sharing  Affect:  Blunted  Cognitive:  Alert and Appropriate  Insight:  Engaged  Engagement in Therapy:  Engaged  Modes of Intervention:  Exploration, Discussion  Selmer Dominion, LCSW 08/19/2015, 1:06 PM

## 2015-08-19 NOTE — Progress Notes (Signed)
Adult Psychoeducational Group Note  Date:  08/19/2015 Time:  9:02 PM  Group Topic/Focus:  Wrap-Up Group:   The focus of this group is to help patients review their daily goal of treatment and discuss progress on daily workbooks.  Participation Level:  Active  Participation Quality:  Appropriate  Affect:  Appropriate  Cognitive:  Appropriate  Insight: Appropriate  Engagement in Group:  Engaged  Modes of Intervention:  Discussion  Additional Comments: The patient expressed that she attended group.The patient also said that she rates her day a 7 which is a good day.  Nash Shearer 08/19/2015, 9:02 PM

## 2015-08-19 NOTE — Plan of Care (Signed)
Problem: Diagnosis: Increased Risk For Suicide Attempt Goal: STG-Patient Will Comply With Medication Regime Outcome: Progressing Patient is compliant with medication regime.     
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
## 2015-08-19 NOTE — Progress Notes (Signed)
DAR NOTE: Patient presents with anxious affect and depressed mood.  Denies pain, auditory and visual hallucinations.  Rates depression at 0, hopelessness at 5, and anxiety at 8.  Maintained on routine safety checks.  Medications given as prescribed.  Support and encouragement offered as needed.  Attended group and participated.  States goal for today is "interacting with other people."  Patient observed socializing with peers in the dayroom.

## 2015-08-20 MED ORDER — DIPHENHYDRAMINE-ZINC ACETATE 2-0.1 % EX CREA
TOPICAL_CREAM | Freq: Three times a day (TID) | CUTANEOUS | Status: DC | PRN
  Administered 2015-08-20 – 2015-08-21 (×2): via TOPICAL
  Administered 2015-08-24: 1 via TOPICAL
  Filled 2015-08-20: qty 28

## 2015-08-20 MED ORDER — ARIPIPRAZOLE ER 400 MG IM SUSR
400.0000 mg | INTRAMUSCULAR | Status: DC
  Filled 2015-08-20: qty 400

## 2015-08-20 NOTE — Progress Notes (Signed)
D: Pt is alert and oriented x4. Pt denies depression, anxiety, pain SI/HI and AVH at the time of assessment. She states, I feel much better today; it has been very peaceful (with a smile)." Pt remained calm and cooperative through the assessment.  A: Pt was encouraged to attend group. Medications offered meds as prescribed.  Support, encouragement, and safe environment provided.  15-minute safety checks continue.  R: Pt was med compliant.  Attended group. Safety checks continue

## 2015-08-20 NOTE — [facility] Group Notes (Signed)
[place] Group Notes:  (Clinical Social Work)  08/20/2015  [place] Group Notes:  (Clinical Social Work)  08/20/2015  11:00AM-12:00PM  Summary of Progress/Problems:  The main focus of today's process group was to listen to a variety of genres of music and to identify that different types of music provoke different responses.  The patient then was able to identify personally what was soothing for them, as well as energizing.  Handouts were used to record feelings evoked, as well as how patient can personally use this knowledge in sleep habits, with depression, and with other symptoms.  The patient expressed understanding of concepts, as well as knowledge of how each type of music affected her and how this can be used at home as a wellness/recovery tool.  She enjoyed a lot of the music and was seen to utilize the entire handout throughout group.  Type of Therapy:  Music Therapy   Participation Level:  Active  Participation Quality:  Attentive and Sharing  Affect:  Blunted  Cognitive:  Oriented  Insight:  Engaged  Engagement in Therapy:  Engaged  Modes of Intervention:   Activity, Exploration  Selmer Dominion, LCSW 08/20/2015

## 2015-08-20 NOTE — [facility] Group Notes (Signed)
[place] Group Notes:  (Nursing/MHT/Case Management/Adjunct)  Date:  08/20/2015  Time:  11:53 AM  Type of Therapy:  Nurse Education  Participation Level:  Active  Participation Quality:  Appropriate and Attentive  Affect:  Appropriate  Cognitive:  Alert and Appropriate  Insight:  Appropriate, Good and Improving  Engagement in Group:  Engaged and Improving  Modes of Intervention:  Activity, Discussion, Education, Exploration and Support  Summary of Progress/Problems: Group topic today was Therapist, nutritional. Discussed healthy and not healthy support people. Discussed making a list of healthy support people and utilize during times of stress. Patient states goal for today is "get to interact with other people."   [name] 08/20/2015, 11:53 AM
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
## 2015-08-20 NOTE — Progress Notes (Signed)
DAR NOTE: Patient mood and affect is bright. Denies pain, auditory and visual hallucinations.  Rates depression at 7, hopelessness at 5, and anxiety at 4.  Maintained on routine safety checks.  Medications given as prescribed.  Support and encouragement offered as needed.  Attended group and participated.  States goal for today is "interacting with other people."  Patient observed socializing with peers in the dayroom.

## 2015-08-21 LAB — BASIC METABOLIC PANEL
Anion gap: 10 (ref 5–15)
BUN: 10 mg/dL (ref 6–20)
CALCIUM: 9.4 mg/dL (ref 8.9–10.3)
CO2: 28 mmol/L (ref 22–32)
CREATININE: 0.75 mg/dL (ref 0.44–1.00)
Chloride: 103 mmol/L (ref 101–111)
GFR calc Af Amer: >60 mL/min (ref >60)
Glucose, Bld: 89 mg/dL (ref 65–99)
POTASSIUM: 3.2 mmol/L — AB (ref 3.5–5.1)
SODIUM: 141 mmol/L (ref 135–145)

## 2015-08-21 LAB — HEMOGLOBIN A1C
HEMOGLOBIN A1C: 5.8 % — AB (ref 4.8–5.6)
Mean Plasma Glucose: 120 mg/dL

## 2015-08-21 LAB — PROLACTIN: PROLACTIN: 15.6 ng/mL (ref 4.8–23.3)

## 2015-08-21 MED ORDER — ARIPIPRAZOLE ER 400 MG IM SUSR
400.0000 mg | INTRAMUSCULAR | Status: DC
  Administered 2015-08-22: 400 mg via INTRAMUSCULAR

## 2015-08-21 MED ORDER — ARIPIPRAZOLE ER 400 MG IM SUSR: 400.0000 mg | INTRAMUSCULAR | Status: DC

## 2015-08-21 MED ORDER — BENZTROPINE MESYLATE 1 MG/ML IJ SOLN
1.0000 mg | Freq: Once | INTRAMUSCULAR | Status: AC
  Administered 2015-08-21: 1 mg via INTRAMUSCULAR
  Filled 2015-08-21: qty 2
  Filled 2015-08-21: qty 1

## 2015-08-21 MED ORDER — BENZTROPINE MESYLATE 0.5 MG PO TABS
0.5000 mg | ORAL_TABLET | Freq: Two times a day (BID) | ORAL | Status: DC
  Administered 2015-08-21 – 2015-08-22 (×2): 0.5 mg via ORAL
  Filled 2015-08-21 (×6): qty 1

## 2015-08-21 MED ORDER — POTASSIUM CHLORIDE CRYS ER 20 MEQ PO TBCR
40.0000 meq | EXTENDED_RELEASE_TABLET | Freq: Two times a day (BID) | ORAL | Status: AC
  Administered 2015-08-21 (×2): 40 meq via ORAL
  Filled 2015-08-21 (×2): qty 2

## 2015-08-21 NOTE — Progress Notes (Signed)
D: Pt is alert and oriented x4. Pt denies depression, anxiety, pain SI/HI and AVH at the time of assessment. She states, I had a wonderful day." Contrary to report received no S/S of AVH from pt. Pt remained calm and cooperative through the assessment.  A: Pt was encouraged to attend group. Medications offered meds as prescribed.  Support, encouragement, and safe environment provided.  15-minute safety checks continue.  R: Pt was med compliant.  Attended group. Safety checks continue

## 2015-08-21 NOTE — Progress Notes (Signed)
[facility] MD Progress Note  08/21/2015  LIS SAVITT  MRN:  [id number] Subjective: Patient states " I feel better overall, but I can't stop shaking. I've been shaking worse with each day and I'm not sure if it's anxiety or what."  Objective ;Sarah [name] is a 39 y.o. 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 Pt's family contacted [employer]PD due to pt getting into the shower with her clothes on, pacing the floor and talking to people that are not there. Per initial notes in EHR " Pt stated that she was at her God sister's house and she started 'tripping out " and was having thoughts about hurting people.  Patient seen and chart reviewed. Pt is alert/oriented x4, calm, cooperative, and appropriate. Pt reports severe anxiety and has visible tremor. Pt's voice is also quivering. Pt reports trouble holding drinks and using utensils when eating. Hands have moderate tremor when outstretched. Pt reports that this has worsened since the 2nd day inpatient even with improvement in anxiety. Pt is not on EPS prophylaxis and we will do so as below. Pt denies suicidal/homcidal ideation and psychosis and does not appear to be responding to internal stimuli. Cites good sleep, much improved, and good appetite as well. She is working well with others in the milieu.   Principal Problem: Schizoaffective disorder, bipolar type ([place]) Diagnosis:   Patient Active Problem List   Diagnosis Date Noted  . Schizoaffective disorder, bipolar type ([place]) [F25.0] 08/17/2015    Priority: High  . Cannabis use disorder, moderate, dependence ([place]) [F12.20] 08/18/2015    Priority: Medium  . Intellectual disability [F79] 08/18/2015    Priority: Low  . Hypokalemia [E87.6] 08/18/2015    Priority: Low   Total Time spent with patient: 25 minutes  Past Psychiatric History: Patient has a hx of schizoaffective  disorder as well as Mild ID , had a legal guardian in the past, unknown if she does have now. Pt has been admitted at [facility] in 2003, 2013, 2012. Pt denies any previous suicide attempts.   Past Medical History:  Past Medical History  Diagnosis Date  . Hypertension   . Depression   . Anxiety    History reviewed. No pertinent past surgical history. Family History:  Family History  Problem Relation Age of Onset  . Anesthesia problems Neg Hx   . Malignant hyperthermia Neg Hx   . Pseudochol deficiency Neg Hx   . Hypotension Neg Hx   . Schizophrenia Mother   . Hypertension Mother    Family Psychiatric  History: Pt reports that her mother has a hx of schizophrenia. Pt denies any hx of alcohol abuse , drug abuse , suicide in family. Social History: Pt is single , lives in [place] by self , denies having any children , reports she is on SSD. Pt denies any pending legal charges.  History  Alcohol Use No     History  Drug Use  . Yes  . Special: Cocaine, Marijuana    Social History   Social History  . Marital Status: Single    Spouse Name: N/A  . Number of Children: N/A  . Years of Education: N/A   Social History Main Topics  . Smoking status: Current Every Day Smoker -- 0.50 packs/day for 4 years    Types: Cigarettes  . Smokeless tobacco: None  . Alcohol Use: No  .  Drug Use: Yes    Special: Cocaine, Marijuana  . Sexual Activity: Not Asked   Other Topics Concern  . None   Social History Narrative   Additional Social History:                         Sleep: Fair  Appetite:  Fair  Current Medications: Current Facility-Administered Medications  Medication Dose Route Frequency Provider Last Rate Last Dose  . acetaminophen (TYLENOL) tablet 650 mg  650 mg Oral Q6H PRN Delfin Gant, NP   650 mg at 08/18/15 2003  . alum & mag hydroxide-simeth (MAALOX/MYLANTA) 200-200-20 MG/5ML suspension 30 mL  30 mL Oral Q4H PRN Delfin Gant, NP      . ARIPiprazole  (ABILIFY) tablet 10 mg  10 mg Oral QHS Ursula Alert, MD   10 mg at 08/20/15 2112  . [START ON 08/22/2015] ARIPiprazole SUSR 400 mg  400 mg Intramuscular Q30 days Ursula Alert, MD      . benztropine (COGENTIN) tablet 0.5 mg  0.5 mg Oral BID Benjamine Mola, FNP      . benztropine mesylate (COGENTIN) injection 1 mg  1 mg Intramuscular Once Benjamine Mola, FNP      . diphenhydrAMINE-zinc acetate (BENADRYL) 2-0.1 % cream   Topical TID PRN Ursula Alert, MD      . hydrochlorothiazide (HYDRODIURIL) tablet 25 mg  25 mg Oral Daily Saramma Eappen, MD   25 mg at 08/21/15 0820  . hydrOXYzine (ATARAX/VISTARIL) tablet 25 mg  25 mg Oral Q6H PRN Ursula Alert, MD      . lamoTRIgine (LAMICTAL) tablet 25 mg  25 mg Oral Daily Ursula Alert, MD   25 mg at 08/21/15 0820  . magnesium hydroxide (MILK OF MAGNESIA) suspension 30 mL  30 mL Oral Daily PRN Delfin Gant, NP      . OLANZapine zydis (ZYPREXA) disintegrating tablet 10 mg  10 mg Oral TID PRN Ursula Alert, MD   10 mg at 08/21/15 0602   Or  . OLANZapine (ZYPREXA) injection 10 mg  10 mg Intramuscular TID PRN Ursula Alert, MD      . potassium chloride SA (K-DUR,KLOR-CON) CR tablet 40 mEq  40 mEq Oral BID Benjamine Mola, FNP      . sodium chloride (OCEAN) 0.65 % nasal spray 1 spray  1 spray Each Nare TID PC & HS Ursula Alert, MD   1 spray at 08/21/15 0821  . traZODone (DESYREL) tablet 100 mg  100 mg Oral QHS PRN Delfin Gant, NP   100 mg at 08/20/15 2112    Lab Results:  Results for orders placed or performed during the hospital encounter of 08/17/15 (from the past 48 hour(s))  Basic metabolic panel     Status: Abnormal   Collection Time: 08/21/15  6:33 AM  Result Value Ref Range   Sodium 141 135 - 145 mmol/L   Potassium 3.2 (L) 3.5 - 5.1 mmol/L   Chloride 103 101 - 111 mmol/L   CO2 28 22 - 32 mmol/L   Glucose, Bld 89 65 - 99 mg/dL   BUN 10 6 - 20 mg/dL   Creatinine, Ser 0.75 0.44 - 1.00 mg/dL   Calcium 9.4 8.9 - 10.3 mg/dL   GFR  calc non Af Amer >60 >60 mL/min   GFR calc Af Amer >60 >60 mL/min    Comment: (NOTE) The eGFR has been calculated using the CKD EPI equation. This calculation has not  been validated in all clinical situations. eGFR's persistently <60 mL/min signify possible Chronic Kidney Disease.    Anion gap 10 5 - 15    Comment: Performed at [facility]    Physical Findings: AIMS: Facial and Oral Movements Muscles of Facial Expression: None, normal Lips and Perioral Area: None, normal Jaw: None, normal Tongue: None, normal,Extremity Movements Upper (arms, wrists, hands, fingers): None, normal Lower (legs, knees, ankles, toes): None, normal, Trunk Movements Neck, shoulders, hips: None, normal, Overall Severity Severity of abnormal movements (highest score from questions above): None, normal Incapacitation due to abnormal movements: None, normal Patient's awareness of abnormal movements (rate only patient's report): No Awareness, Dental Status Current problems with teeth and/or dentures?: No Does patient usually wear dentures?: No  CIWA:    COWS:  COWS Total Score: 0  Musculoskeletal: Strength & Muscle Tone: within normal limits Gait & Station: normal Patient leans: N/A  Psychiatric Specialty Exam: Review of Systems  Psychiatric/Behavioral: Positive for depression and substance abuse. The patient is nervous/anxious and has insomnia.   All other systems reviewed and are negative.   Blood pressure 172/105, pulse 101, temperature 98.2 F (36.8 C), temperature source Oral, resp. rate 18, height '5\' 2"'  (1.575 m), weight 125.193 kg (276 lb), SpO2 100 %.Body mass index is 50.47 kg/(m^2).  General Appearance: Fairly Groomed and Guarded  Engineer, water::  Fair  Speech:  Normal Rate  Volume:  Decreased  Mood:  Anxious  Affect:  Congruent  Thought Process:  Linear  Orientation:  Full (Time, Place, and Person)  Thought Content:  Paranoid Ideation and Rumination  Suicidal  Thoughts:  No  Homicidal Thoughts:  No  Memory:  Immediate;   Fair Recent;   Fair Remote;   Fair  Judgement:  Impaired  Insight:  Lacking  Psychomotor Activity:  Restlessness  Concentration:  Poor  Recall:  [employer] of Knowledge:Fair  Language: Fair  Akathisia:  No  Handed:  Right  AIMS (if indicated):     Assets:  Desire for Improvement  ADL's:  Intact  Cognition: WNL  Sleep:  Number of Hours: 1   Treatment Plan Summary: Daily contact with patient to assess and evaluate symptoms and progress in treatment and Medication management  Will continue Abilify  10 mg po qhs for psychosis. Abilify Miantena 400 mg IM to be given prior to DC, pt in agreement Cogentin 42m IM stat for EPS symptoms (severe tremor) Cogentin 0.52mPO bid for EPS prophylaxis Kdur [id number] 2 doses for K+ lab at 3.2 (goal of 3.6) Will continue Lamictal 25 mg po daily for mood sx. Will continue Cogentin 0.5 mg po qhs for EPS. Will continueTrazodone 100 mg po qhs for sleep. Will continue  Zyprexa 10 mg tid prn for severe agitation. Will continue to monitor vitals ,medication compliance and treatment side effects while patient is here.  Will monitor for medical issues as well as call consult as needed.  Patient with Hypokalemia -  - 3.3 - low , will continue Kdur 10 meq .Repeat BMP . Reviewed labs  TSH- wnl  ,lipid panel- wnl , pending PL, pending hba1c. CSW will start working on disposition.  Patient to participate in therapeutic milieu.   WitBenjamine MolaNP-BC 08/21/2015, 10:45 AM Agree with NP Progress Note, as above FerNeita GarnetMD

## 2015-08-21 NOTE — Progress Notes (Signed)
Nursing Note: 0700-1900  D:  Pt. presents with anxious mood, she is cautious to share information and speaks in a soft voice. Self- Inventory:  Rates depression 8/10, hopelessness 7/10 and anxiety 0/10.   A:  Encouraged to verbalize needs and concerns, active listening and support provided.  This RN had many interactions with pt during the shift to help with small questions and concerns throughout the day.   Continued Q 15 minute safety checks.  Observed active participation in group settings.  R:  Pt. denies A/V hallucinations and is able to verbally contract for safety. Pt listed one of her goals today was to "teach a group a church song."

## 2015-08-21 NOTE — Progress Notes (Signed)
Adult Psychoeducational Group Note  Date:  08/21/2015 Time:  12:24 AM  Group Topic/Focus:  Wrap-Up Group:   The focus of this group is to help patients review their daily goal of treatment and discuss progress on daily workbooks.  Participation Level:  Active  Participation Quality:  Appropriate and Attentive  Affect:  Appropriate  Cognitive:  Appropriate  Insight: Appropriate  Engagement in Group:  Engaged  Modes of Intervention:  Education  Additional Comments:  Pt stated she had a good day. Pt said she learned today how to cope with getting along with others even when she is angry.   Jerline Pain 08/21/2015, 12:24 AM

## 2015-08-21 NOTE — BHH Group Notes (Signed)
[facility] Group Notes:  (Counselor/Nursing/MHT/Case Management/Adjunct)  08/21/2015 1:15PM  Type of Therapy:  Group Therapy  Participation Level:  Active  Participation Quality:  Appropriate  Affect:  Flat  Cognitive:  Oriented  Insight:  Improving  Engagement in Group:  Limited  Engagement in Therapy:  Limited  Modes of Intervention:  Discussion, Exploration and Socialization  Summary of Progress/Problems: The topic for group was balance in life.  Pt participated in the discussion about when their life was in balance and out of balance and how this feels.  Pt discussed ways to get back in balance and short term goals they can work on to get where they want to be. Stayed the entire time. Minimal participation.  Eyes closed for much of the time.  Talked about how taking a bath with bubbles and lighting candles helps her find balance.   Trish Mage 08/21/2015 3:19 PM

## 2015-08-21 NOTE — Progress Notes (Signed)
Adult Psychoeducational Group Note  Date:  08/21/2015 Time:  9:28 PM  Group Topic/Focus:  Wrap-Up Group:   The focus of this group is to help patients review their daily goal of treatment and discuss progress on daily workbooks.  Participation Level:  Active  Participation Quality:  Appropriate  Affect:  Appropriate  Cognitive:  Appropriate  Insight: Appropriate  Engagement in Group:  Engaged  Modes of Intervention:  Discussion  Additional Comments: The patient expressed that she attend group.The patient also said that group was about emotion and coping skills.  Nash Shearer 08/21/2015, 9:28 PM

## 2015-08-22 LAB — BASIC METABOLIC PANEL
ANION GAP: 7 (ref 5–15)
BUN: 10 mg/dL (ref 6–20)
CHLORIDE: 105 mmol/L (ref 101–111)
CO2: 27 mmol/L (ref 22–32)
Calcium: 9.1 mg/dL (ref 8.9–10.3)
Creatinine, Ser: 0.73 mg/dL (ref 0.44–1.00)
GFR calc non Af Amer: >60 mL/min (ref >60)
Glucose, Bld: 93 mg/dL (ref 65–99)
POTASSIUM: 3.7 mmol/L (ref 3.5–5.1)
SODIUM: 139 mmol/L (ref 135–145)

## 2015-08-22 MED ORDER — LAMOTRIGINE 25 MG PO TABS
25.0000 mg | ORAL_TABLET | Freq: Two times a day (BID) | ORAL | Status: DC
  Administered 2015-08-22 – 2015-08-24 (×4): 25 mg via ORAL
  Filled 2015-08-22: qty 1
  Filled 2015-08-22: qty 4
  Filled 2015-08-22: qty 1
  Filled 2015-08-22: qty 4
  Filled 2015-08-22 (×6): qty 1

## 2015-08-22 MED ORDER — ARIPIPRAZOLE 15 MG PO TABS
15.0000 mg | ORAL_TABLET | Freq: Every day | ORAL | Status: DC
  Administered 2015-08-22 – 2015-08-23 (×2): 15 mg via ORAL
  Filled 2015-08-22 (×3): qty 1
  Filled 2015-08-22: qty 2
  Filled 2015-08-22: qty 1

## 2015-08-22 MED ORDER — BENZTROPINE MESYLATE 1 MG PO TABS
1.0000 mg | ORAL_TABLET | Freq: Every day | ORAL | Status: DC
  Administered 2015-08-22 – 2015-08-23 (×2): 1 mg via ORAL
  Filled 2015-08-22 (×2): qty 1
  Filled 2015-08-22: qty 2
  Filled 2015-08-22 (×2): qty 1

## 2015-08-22 MED ORDER — TRAZODONE HCL 150 MG PO TABS
150.0000 mg | ORAL_TABLET | Freq: Every day | ORAL | Status: DC
  Administered 2015-08-22 – 2015-08-23 (×2): 150 mg via ORAL
  Filled 2015-08-22: qty 1
  Filled 2015-08-22: qty 2
  Filled 2015-08-22 (×3): qty 1

## 2015-08-22 NOTE — BHH Group Notes (Signed)
[facility] Group Notes:  (Counselor/Nursing/MHT/Case Management/Adjunct)  08/22/2015 1:15PM  Type of Therapy:  Group Therapy  Participation Level:  Active  Participation Quality:  Appropriate  Affect:  Flat  Cognitive:  Oriented  Insight:  Improving  Engagement in Group:  Limited  Engagement in Therapy:  Limited  Modes of Intervention:  Discussion, Exploration and Socialization  Summary of Progress/Problems: The topic for group was balance in life.  Pt participated in the discussion about when their life was in balance and out of balance and how this feels.  Pt discussed ways to get back in balance and short term goals they can work on to get where they want to be. Stayed the entire time.  Engaged throughout.  Minimal participation.  Talked about how she has a strong belief in herself which was passed on to her "by certain members of my family."  Also talked about reminding herself of her goals when she is feeling directionless as a way of getting back on track.   Sarah [name] 08/22/2015 4:05 PM

## 2015-08-22 NOTE — Progress Notes (Signed)
Pt currently presents with a bright affect upon approach  and confused behavior. Pt has been observed responding to internal stimuli and has been undoing her bed and remaking it most of the day. Per self inventory, pt rates depression at a 0, hopelessness 0 and anxiety 7. Pt's daily goal is to "cope with others" and intend to do so by "being a group with activity." Pt reports fair sleep, a good appetite, high energy and good concentration. Pt's safety ensured with 15 minute and environmental checks. Pt currently denies SI/HI and A/V hallucinations. Pt verbally agrees to seek staff if SI/HI or A/VH occurs and to consult with staff before acting on these thoughts. Will continue POC.

## 2015-08-22 NOTE — Progress Notes (Signed)
D: Pt presents cautions and bizarre in behavior. Pt inappropriately smiles throughout our interaction. Pt denied any SI/HI/AVH.  Pt observed standing in the dark and combing her hair during one of writer's checks of pt. Pt was encouraged to get some rest in preparation of Tuesday's activities. Pt returned to bed.  A: Writer administered scheduled and prn medications to pt, per MD orders. Continued support and availability as needed was extended to this pt. Staff continues to monitor pt with q66min checks.  R: No adverse drug reactions noted. Pt receptive to treatment. Pt remains safe at this time

## 2015-08-22 NOTE — Progress Notes (Signed)
[facility] MD Progress Note  08/22/2015  DEAISHA [name]  MRN:  [id number] Subjective: Patient states " I feel fine , I like to cover my hair .'   Objective ;Sarah [name] is a 39 y.o. 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 Pt's family contacted [employer] due to pt getting into the shower with her clothes on, pacing the floor and talking to people that are not there. Per initial notes in EHR " Pt stated that she was at her God sister's house and she started 'tripping out " and was having thoughts about hurting people.  Patient seen and chart reviewed. Pt is alert/oriented x4, calm, cooperative, and appropriate.  Pt continues to have periods when she appears disorganized, seen as restless and isolative oftne. Pt denies any other concerns. She had EPS -tremors yesterday - was treated for the same. She denies any tremors today. Pt per nursing continues to need a lot of support and encouragement.    Principal Problem: Schizoaffective disorder, bipolar type ([place]) Diagnosis:   Patient Active Problem List   Diagnosis Date Noted  . Intellectual disability [F79] 08/18/2015  . Cannabis use disorder, moderate, dependence ([place]) [F12.20] 08/18/2015  . Hypokalemia [E87.6] 08/18/2015  . Schizoaffective disorder, bipolar type ([place]) [F25.0] 08/17/2015   Total Time spent with patient: 25 minutes  Past Psychiatric History: Patient has a hx of schizoaffective disorder as well as Mild ID , had a legal guardian in the past, unknown if she does have now. Pt has been admitted at [facility] in 2003, 2013, 2012. Pt denies any previous suicide attempts.   Past Medical History:  Past Medical History  Diagnosis Date  . Hypertension   . Depression   . Anxiety    History reviewed. No pertinent past surgical history. Family History:  Family History  Problem Relation Age of Onset  . Anesthesia  problems Neg Hx   . Malignant hyperthermia Neg Hx   . Pseudochol deficiency Neg Hx   . Hypotension Neg Hx   . Schizophrenia Mother   . Hypertension Mother    Family Psychiatric  History: Pt reports that her mother has a hx of schizophrenia. Pt denies any hx of alcohol abuse , drug abuse , suicide in family. Social History: Pt is single , lives in [place] by self , denies having any children , reports she is on SSD. Pt denies any pending legal charges.  History  Alcohol Use No     History  Drug Use  . Yes  . Special: Cocaine, Marijuana    Social History   Social History  . Marital Status: Single    Spouse Name: N/A  . Number of Children: N/A  . Years of Education: N/A   Social History Main Topics  . Smoking status: Current Every Day Smoker -- 0.50 packs/day for 4 years    Types: Cigarettes  . Smokeless tobacco: None  . Alcohol Use: No  . Drug Use: Yes    Special: Cocaine, Marijuana  . Sexual Activity: Not Asked   Other Topics Concern  . None   Social History Narrative   Additional Social History:                         Sleep: Poor  Appetite:  Fair  Current Medications: Current Facility-Administered Medications  Medication Dose Route Frequency Provider  Last Rate Last Dose  . acetaminophen (TYLENOL) tablet 650 mg  650 mg Oral Q6H PRN Delfin Gant, NP   650 mg at 08/18/15 2003  . alum & mag hydroxide-simeth (MAALOX/MYLANTA) 200-200-20 MG/5ML suspension 30 mL  30 mL Oral Q4H PRN Delfin Gant, NP      . ARIPiprazole (ABILIFY) tablet 15 mg  15 mg Oral QHS Deni Lefever, MD      . ARIPiprazole SUSR 400 mg  400 mg Intramuscular Q30 days Ursula Alert, MD   400 mg at 08/22/15 1402  . benztropine (COGENTIN) tablet 0.5 mg  0.5 mg Oral BID Benjamine Mola, FNP   0.5 mg at 08/22/15 0834  . diphenhydrAMINE-zinc acetate (BENADRYL) 2-0.1 % cream   Topical TID PRN Ursula Alert, MD      . hydrochlorothiazide (HYDRODIURIL) tablet 25 mg  25 mg Oral Daily  Ursula Alert, MD   25 mg at 08/22/15 0834  . hydrOXYzine (ATARAX/VISTARIL) tablet 25 mg  25 mg Oral Q6H PRN Ursula Alert, MD      . lamoTRIgine (LAMICTAL) tablet 25 mg  25 mg Oral BID Anakin Varkey, MD      . magnesium hydroxide (MILK OF MAGNESIA) suspension 30 mL  30 mL Oral Daily PRN Delfin Gant, NP   30 mL at 08/22/15 0011  . OLANZapine zydis (ZYPREXA) disintegrating tablet 10 mg  10 mg Oral TID PRN Ursula Alert, MD   10 mg at 08/21/15 0602   Or  . OLANZapine (ZYPREXA) injection 10 mg  10 mg Intramuscular TID PRN Ursula Alert, MD      . sodium chloride (OCEAN) 0.65 % nasal spray 1 spray  1 spray Each Nare TID PC & HS Ursula Alert, MD   1 spray at 08/22/15 1314  . traZODone (DESYREL) tablet 150 mg  150 mg Oral QHS Ursula Alert, MD        Lab Results:  Results for orders placed or performed during the hospital encounter of 08/17/15 (from the past 48 hour(s))  Basic metabolic panel     Status: Abnormal   Collection Time: 08/21/15  6:33 AM  Result Value Ref Range   Sodium 141 135 - 145 mmol/L   Potassium 3.2 (L) 3.5 - 5.1 mmol/L   Chloride 103 101 - 111 mmol/L   CO2 28 22 - 32 mmol/L   Glucose, Bld 89 65 - 99 mg/dL   BUN 10 6 - 20 mg/dL   Creatinine, Ser 0.75 0.44 - 1.00 mg/dL   Calcium 9.4 8.9 - 10.3 mg/dL   GFR calc non Af Amer >60 >60 mL/min   GFR calc Af Amer >60 >60 mL/min    Comment: (NOTE) The eGFR has been calculated using the CKD EPI equation. This calculation has not been validated in all clinical situations. eGFR's persistently <60 mL/min signify possible Chronic Kidney Disease.    Anion gap 10 5 - 15    Comment: Performed at [facility] metabolic panel     Status: None   Collection Time: 08/22/15  6:25 AM  Result Value Ref Range   Sodium 139 135 - 145 mmol/L   Potassium 3.7 3.5 - 5.1 mmol/L   Chloride 105 101 - 111 mmol/L   CO2 27 22 - 32 mmol/L   Glucose, Bld 93 65 - 99 mg/dL   BUN 10 6 - 20 mg/dL   Creatinine, Ser  0.73 0.44 - 1.00 mg/dL   Calcium 9.1 8.9 - 10.3 mg/dL  GFR calc non Af Amer >60 >60 mL/min   GFR calc Af Amer >60 >60 mL/min    Comment: (NOTE) The eGFR has been calculated using the CKD EPI equation. This calculation has not been validated in all clinical situations. eGFR's persistently <60 mL/min signify possible Chronic Kidney Disease.    Anion gap 7 5 - 15    Comment: Performed at [facility]    Physical Findings: AIMS: Facial and Oral Movements Muscles of Facial Expression: None, normal Lips and Perioral Area: None, normal Jaw: None, normal Tongue: None, normal,Extremity Movements Upper (arms, wrists, hands, fingers): None, normal Lower (legs, knees, ankles, toes): None, normal, Trunk Movements Neck, shoulders, hips: None, normal, Overall Severity Severity of abnormal movements (highest score from questions above): None, normal Incapacitation due to abnormal movements: None, normal Patient's awareness of abnormal movements (rate only patient's report): No Awareness, Dental Status Current problems with teeth and/or dentures?: No Does patient usually wear dentures?: No  CIWA:    COWS:  COWS Total Score: 0  Musculoskeletal: Strength & Muscle Tone: within normal limits Gait & Station: normal Patient leans: N/A  Psychiatric Specialty Exam: Review of Systems  Psychiatric/Behavioral: Positive for depression and substance abuse. The patient is nervous/anxious and has insomnia.   All other systems reviewed and are negative.   Blood pressure 169/99, pulse 83, temperature 98.1 F (36.7 C), temperature source Oral, resp. rate 12, height 5' 2" (1.575 m), weight 125.193 kg (276 lb), SpO2 100 %.Body mass index is 50.47 kg/(m^2).  General Appearance: Fairly Groomed and Guarded  Engineer, water::  Fair  Speech:  Normal Rate  Volume:  Decreased  Mood:  Anxious  Affect:  Congruent  Thought Process:  Irrelevant at times  Orientation:  Full (Time, Place, and Person)   Thought Content:  Paranoid Ideation and Rumination  Suicidal Thoughts:  No  Homicidal Thoughts:  No  Memory:  Immediate;   Fair Recent;   Fair Remote;   Fair  Judgement:  Impaired  Insight:  Lacking  Psychomotor Activity:  Restlessness  Concentration:  Poor  Recall:  [employer] of Knowledge:Fair  Language: Fair  Akathisia:  No  Handed:  Right  AIMS (if indicated):     Assets:  Desire for Improvement  ADL's:  Intact  Cognition: WNL  Sleep:  Number of Hours: 3   Treatment Plan Summary: Daily contact with patient to assess and evaluate symptoms and progress in treatment and Medication management  Will increase Abilify to 15 mg po qhs for psychosis. Abilify Miantena 400 mg IM today - 08/22/15. Repeat q 28 days . Cogentin 1 mg po qhs for EPS. Will increase Lamictal to 25 mg po BID for mood sx. Will continue Cogentin 0.5 mg po qhs for EPS. Will increase Trazodone 150 mg po qhs for sleep. Will continue  Zyprexa 10 mg tid prn for severe agitation. Will continue to monitor vitals ,medication compliance and treatment side effects while patient is here.  Will monitor for medical issues as well as call consult as needed.  CSW will start working on disposition.  Patient to participate in therapeutic milieu.   Guthrie Lemme, MD 08/22/2015, 3;22 PM

## 2015-08-22 NOTE — BHH Group Notes (Signed)
[facility] Group Notes:  (Nursing/MHT/Case Management/Adjunct)  Date:  08/22/2015  Time:  0930 Type of Therapy:  Nurse Education  Participation Level:  Active  Participation Quality:  Appropriate and Attentive  Affect:  Appropriate  Cognitive:  Alert and Appropriate  Insight:  Appropriate, Good and Improving  Engagement in Group:  Engaged and Improving  Modes of Intervention:  Activity, Discussion, Education and Exploration  Summary of Progress/Problems: Topic was recovery. Discussed what recovery means to the individual and ways to ensure that the individual stays on the road to recovery. Group educated on the importance of medication adherence. Patient was attentive and receptive. states goal is to "stay away from the negative things that brought her here and to pursue new things in life." [name] 08/22/2015, 6:37 PM

## 2015-08-23 MED ORDER — HYDROCERIN EX CREA
TOPICAL_CREAM | Freq: Two times a day (BID) | CUTANEOUS | Status: DC
  Administered 2015-08-23 – 2015-08-24 (×2): via TOPICAL
  Filled 2015-08-23: qty 113

## 2015-08-23 NOTE — BHH Group Notes (Signed)
[facility] LCSW Aftercare Discharge Planning Group Note   08/23/2015 9:37 AM  Participation Quality:  Active  Mood/Affect:  Appropriate  Depression Rating:  Pt denies   Anxiety Rating:  Pt denies   Thoughts of Suicide:  No Will you contract for safety?   NA  Current AVH:  No  Plan for Discharge/Comments:  Pt was alert and pleasant throughout group. Pt reports that she is feeling well today. Pt denies all symptoms but seems to be responding to internal stimuli as evidenced by laughing to herself at random times. Will follow-up outpt with [facility].  Transportation Means:   Supports:  Georga Kaufmann

## 2015-08-23 NOTE — Plan of Care (Signed)
Problem: Diagnosis: Increased Risk For Suicide Attempt Goal: STG-Patient Will Attend All Groups On The Unit Outcome: Progressing Pt attended the evening wrap-up group on 08/22/15.

## 2015-08-23 NOTE — Plan of Care (Signed)
Problem: Diagnosis: Increased Risk For Suicide Attempt Goal: STG-Patient Will Comply With Medication Regime Outcome: Progressing Pt is compliant with her medication regimen.

## 2015-08-23 NOTE — Progress Notes (Signed)
D: Pt presents sad in affect but brightens upon interaction. Pt observed undoing and remaking her bed as previously mentioned by the day shift RN. Pt denied any SI/HI/AVH, but appeared to be preoccupied. Pt is visible and active within the milieu. Pt is compliant with her current POC.  A: Writer administered scheduled medications to pt, per MD orders. Continued support and availability as needed was extended to this pt. Staff continues to monitor pt with q27min checks.  R: No adverse drug reactions noted. Pt receptive to treatment. Pt remains safe at this time.
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
## 2015-08-23 NOTE — Progress Notes (Signed)
Adult Psychoeducational Group Note  Date:  08/23/2015 Time:  8:30 PM  Group Topic/Focus:  Wrap-Up Group:   The focus of this group is to help patients review their daily goal of treatment and discuss progress on daily workbooks.  Participation Level:  Active  Participation Quality:  Appropriate  Affect:  Appropriate  Cognitive:  Appropriate  Insight: Appropriate  Engagement in Group:  Improving  Modes of Intervention:  Discussion  Additional Comments: The patient expressed she attended group.The patient also said that group was about feelings and emotions.  Nash Shearer 08/23/2015, 8:30 PM

## 2015-08-23 NOTE — Progress Notes (Signed)
Patient ID: Sarah [name], female   DOB: 1976-03-22, 39 y.o.   MRN: [id number] D  --   Pt, has been friendly and cooperative on the unit today.  She is polite and respectful to staff and interacts well with peers.  She has shown no signs of response to internal stimuli .   Pt. Is reported to be MR, intellectually dis-abled.   Pt. Is possibly going to be [facility]  tomorrow.   Pt. Has agreed to contractr for safety and has taken medications as asked.  ---  A ---  Support and encouragement provided.  --- A --  Pt. Remains safe on unit

## 2015-08-23 NOTE — BHH Group Notes (Signed)
[facility] LCSW Group Therapy  08/23/2015 3:09 PM  Type of Therapy: Group Therapy  Participation Level: Active  Participation Quality: Attentive  Affect: Flat  Cognitive: Oriented  Insight: Limited  Engagement in Therapy: Engaged  Modes of Intervention: Discussion and Socialization  Summary of Progress/Problems: Shanon Brow from [facility] was here to tell his story of recovery and play his guitar. Pt was alert and oriented. Pt seemed interested in what the speaker had to say and asked questions when appropriate.   Kara Mead. Marshell Levan 08/23/2015 3:09 PM

## 2015-08-24 MED ORDER — ARIPIPRAZOLE 15 MG PO TABS: 15.0000 mg | ORAL_TABLET | Freq: Every day | ORAL | Status: DC

## 2015-08-24 MED ORDER — TRAZODONE HCL 150 MG PO TABS: 150.0000 mg | ORAL_TABLET | Freq: Every day | ORAL | Status: DC

## 2015-08-24 MED ORDER — HYDROCHLOROTHIAZIDE 25 MG PO TABS: 25.0000 mg | ORAL_TABLET | Freq: Every day | ORAL | Status: DC

## 2015-08-24 MED ORDER — LAMOTRIGINE 25 MG PO TABS: 25.0000 mg | ORAL_TABLET | Freq: Two times a day (BID) | ORAL | Status: DC

## 2015-08-24 MED ORDER — BENZTROPINE MESYLATE 1 MG PO TABS: 1.0000 mg | ORAL_TABLET | Freq: Every day | ORAL | Status: DC

## 2015-08-24 MED ORDER — HYDROXYZINE HCL 25 MG PO TABS: 25.0000 mg | ORAL_TABLET | Freq: Three times a day (TID) | ORAL | Status: DC | PRN
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
## 2015-08-24 NOTE — Progress Notes (Signed)
D: Sarah [name] denies SI/HI/AVH at this time. She rates depression 6/10 Anxiety 2/10. She is minimally interactive accept with it is time to take her medications. She has no concerns tonight and denies pain at this time.  A: Encouragement and support given.  R: Continue to monitor for patient safety and medication effectiveness.

## 2015-08-24 NOTE — Progress Notes (Signed)
  [facility] Adult Case Management Discharge Plan :  Will you be returning to the same living situation after discharge:  Yes,  will return home At discharge, do you have transportation home?: Yes,  public transit Do you have the ability to pay for your medications: Yes,  insurance   Release of information consent forms completed and in the chart;  Patient's signature needed at discharge.  Patient to Follow up at: Follow-up Information    Go to [facility].   Specialty:  Behavioral Health   Why:  [name] T[facility] will contact you for a hospital follow-up apt. If you do not hear anything from [facility] by Monday please call Reggie from [facility] at [phone]   Contact information:   [place] [address] [phone]       Next level of care provider has access to [place]  Patient denies SI/HI: Yes,  yes    Safety Planning and Suicide Prevention discussed: Yes,  yes     Has patient been referred to the Quitline?: Yes, faxed on 08/24/15  Georga Kaufmann 08/24/2015, 10:19 AM

## 2015-08-24 NOTE — BHH Suicide Risk Assessment (Signed)
[place] INPATIENT:  Family/Significant Other Suicide Prevention Education  Suicide Prevention Education:  Education Completed; No one has been identified by the patient as the family member/significant other with whom the patient will be residing, and identified as the person(s) who will aid the patient in the event of a mental health crisis (suicidal ideations/suicide attempt).  With written consent from the patient, the family member/significant other has been provided the following suicide prevention education, prior to the and/or following the discharge of the patient.  The suicide prevention education provided includes the following:  Suicide risk factors  Suicide prevention and interventions  National Suicide Hotline telephone number  [facility] assessment telephone number  [facility] Emergency Assistance [place] and/or Residential Mobile Crisis Unit telephone number  Request made of family/significant other to:  Remove weapons (e.g., guns, rifles, knives), all items previously/currently identified as safety concern.    Remove drugs/medications (over-the-counter, prescriptions, illicit drugs), all items previously/currently identified as a safety concern.  The family member/significant other verbalizes understanding of the suicide prevention education information provided.  The family member/significant other agrees to remove the items of safety concern listed above.  Pt was not suicidal prior to discharge note. Pt also did not endorse SI while here. SPE not required.    Sarah [name] 08/24/2015, 10:06 AM

## 2015-08-24 NOTE — BHH Group Notes (Signed)
[place] LCSW Group Therapy  08/24/2015 1:15 pm  Type of Therapy: Process Group Therapy  Participation Level:  Active  Participation Quality:  Appropriate  Affect:  Flat  Cognitive:  Oriented  Insight:  Improving  Engagement in Group:  Limited  Engagement in Therapy:  Limited  Modes of Intervention:  Activity, Clarification, Education, Problem-solving and Support  Summary of Progress/Problems: Today's group addressed the issue of overcoming obstacles.  Patients were asked to identify their biggest obstacle post d/c that stands in the way of their on-going success, and then problem solve as to how to manage this. Gave good advice to a peer about dealing with loss.  Talked about the grieving process, and how it takes time.  Roque Lias B 08/24/2015   1:31 PM

## 2015-08-24 NOTE — BHH Suicide Risk Assessment (Signed)
[facility] Discharge Suicide Risk Assessment   Demographic Factors:  NA  Total Time spent with patient: 30 minutes  Musculoskeletal: Strength & Muscle Tone: within normal limits Gait & Station: normal Patient leans: N/A  Psychiatric Specialty Exam: Physical Exam  Review of Systems  Psychiatric/Behavioral: Positive for substance abuse. Negative for depression, suicidal ideas and hallucinations. The patient is not nervous/anxious.   All other systems reviewed and are negative.   Blood pressure 141/79, pulse 76, temperature 98.1 F (36.7 C), temperature source Oral, resp. rate 18, height 5\' 2"  (1.575 m), weight 125.193 kg (276 lb), SpO2 100 %.Body mass index is 50.47 kg/(m^2).  General Appearance: Casual  Eye Contact::  Fair  Speech:  Clear and [id number]  Volume:  Normal  Mood:  Euthymic  Affect:  Appropriate  Thought Process:  Coherent  Orientation:  Full (Time, Place, and Person)  Thought Content:  WDL  Suicidal Thoughts:  No  Homicidal Thoughts:  No  Memory:  Immediate;   Fair Recent;   Fair Remote;   Fair  Judgement:  Fair  Insight:  Fair  Psychomotor Activity:  Normal  Concentration:  Fair  Recall:  [employer] of Knowledge:Fair  Language: Fair  Akathisia:  No  Handed:  Right  AIMS (if indicated):     Assets:  Communication Skills Desire for Improvement  Sleep:  Number of Hours: 4  Cognition: WNL  ADL's:  Intact      Has this patient used any form of tobacco in the last 30 days? (Cigarettes, Smokeless Tobacco, Cigars, and/or Pipes) Yes, A prescription for an FDA-approved tobacco cessation medication was offered at discharge and the patient refused  Mental Status Per Nursing Assessment::   On Admission:     Current Mental Status by Physician: Patient denies SI/HI/AH/VH  Loss Factors: NA  Historical Factors: Impulsivity  Risk Reduction Factors:   Positive social support  Continued Clinical Symptoms:  Previous Psychiatric Diagnoses and  Treatments  Cognitive Features That Contribute To Risk:  Polarized thinking    Suicide Risk:  Minimal: No identifiable suicidal ideation.  Patients presenting with no risk factors but with morbid ruminations; may be classified as minimal risk based on the severity of the depressive symptoms  Principal Problem: Schizoaffective disorder, bipolar type [facility]) Discharge Diagnoses:  Patient Active Problem List   Diagnosis Date Noted  . Intellectual disability [F79] 08/18/2015  . Cannabis use disorder, moderate, dependence ([place]) [F12.20] 08/18/2015  . Schizoaffective disorder, bipolar type ([place]) [F25.0] 08/17/2015    Follow-up Information    Go to [facility].   Specialty:  Behavioral Health   Why:  Walk in apt Mon-Fri 8-3p. Please go at your earliest convinence.    Contact information:   [place] [address] [phone]       Plan Of Care/Follow-up recommendations:  Activity:  No restrictions Diet:  regular Tests:  as needed Other:  follow up with after care as needed  Is patient on multiple antipsychotic therapies at discharge:  No   Has Patient had three or more failed trials of antipsychotic monotherapy by history:  No  Recommended Plan for Multiple Antipsychotic Therapies: NA    Zuleyka Kloc MD 08/24/2015, 9:38 AM

## 2015-08-24 NOTE — BHH Group Notes (Signed)
[place] Group Notes:  (Nursing/MHT/Case Management/Adjunct)  Date:  08/24/2015  Time:  1:36 PM Type of Therapy:  Psychoeducational Skills  Participation Level:  Active  Participation Quality:  Appropriate  Affect:  Appropriate  Cognitive:  Appropriate  Insight:  Appropriate  Engagement in Group:  Engaged  Modes of Intervention:  Problem-solving  Summary of Progress/Problems: Topic was on leisure and lifestyle changes. Discussed the important of choosing healthy leisure activities. Group encouraged to surround themselves with positive and healthy group/support system when changing to a healthy life style.   Wolfgang Phoenix 08/24/2015, 1:36 PM

## 2015-08-24 NOTE — Progress Notes (Signed)
Pt discharged home with a bus pass. Pt was ambulatory, stable and appreciative at that time. All papers and prescriptions were given and valuables returned. Verbal understanding expressed. Denies SI/HI and A/VH. Pt given opportunity to express concerns and ask questions.

## 2015-09-07 ENCOUNTER — Encounter (HOSPITAL_COMMUNITY): Payer: Self-pay | Admitting: Emergency Medicine

## 2015-09-07 ENCOUNTER — Emergency Department (HOSPITAL_COMMUNITY)
Admission: EM | Admit: 2015-09-07 | Discharge: 2015-09-08 | Disposition: A | Discharge status: Home / Self Care | Payer: Medicaid Other | Attending: Emergency Medicine | Admitting: Emergency Medicine

## 2015-09-07 DIAGNOSIS — R1084 Generalized abdominal pain: Secondary | ICD-10-CM | POA: Diagnosis not present

## 2015-09-07 DIAGNOSIS — F329 Major depressive disorder, single episode, unspecified: Secondary | ICD-10-CM | POA: Insufficient documentation

## 2015-09-07 DIAGNOSIS — I10 Essential (primary) hypertension: Secondary | ICD-10-CM | POA: Insufficient documentation

## 2015-09-07 DIAGNOSIS — R110 Nausea: Secondary | ICD-10-CM | POA: Insufficient documentation

## 2015-09-07 DIAGNOSIS — Z3202 Encounter for pregnancy test, result negative: Secondary | ICD-10-CM | POA: Diagnosis not present

## 2015-09-07 DIAGNOSIS — Z79899 Other long term (current) drug therapy: Secondary | ICD-10-CM | POA: Insufficient documentation

## 2015-09-07 DIAGNOSIS — F17210 Nicotine dependence, cigarettes, uncomplicated: Secondary | ICD-10-CM | POA: Insufficient documentation

## 2015-09-07 DIAGNOSIS — R252 Cramp and spasm: Secondary | ICD-10-CM | POA: Insufficient documentation

## 2015-09-07 DIAGNOSIS — M25552 Pain in left hip: Secondary | ICD-10-CM | POA: Diagnosis present

## 2015-09-07 DIAGNOSIS — F419 Anxiety disorder, unspecified: Secondary | ICD-10-CM | POA: Insufficient documentation

## 2015-09-07 DIAGNOSIS — E876 Hypokalemia: Secondary | ICD-10-CM | POA: Insufficient documentation

## 2015-09-07 LAB — POC URINE PREG, ED: Preg Test, Ur: NEGATIVE

## 2015-09-07 NOTE — ED Notes (Signed)
Bed: [id number] Expected date:  Expected time:  Means of arrival:  Comments: EMS lower extremity and abd pain

## 2015-09-07 NOTE — ED Notes (Signed)
Per ems pt c/o lower extremity pain as well as hips x1 hour.  Reports this happened 2-3 weeks ago and was diagnosed with yellow jaundice.

## 2015-09-07 NOTE — ED Notes (Signed)
Patient ambulated to bathroom without difficulty.  Able to provide urine specimen. Patient back in bed.

## 2015-09-07 NOTE — ED Provider Notes (Signed)
CSN: [id number]     Arrival date & time 09/07/15  2243 History   First MD Initiated Contact with Patient 09/07/15 2245     Chief Complaint  Patient presents with  . Extremity Pain     (Consider location/radiation/quality/duration/timing/severity/associated sxs/prior Treatment) The history is provided by the patient and medical records. No language interpreter was used.     Heran Strohecker Wierzbicki is a 39 y.o. female  with a hx of hypertension, depression, anxiety presents to the Emergency Department complaining of gradual, persistent, progressively worsening bilateral posterior leg cramping, hip pain and abdominal pain onset 1 hour prior to arrival. Patient reports this happened several weeks ago and she was told she had problem with her bilirubin. She reports this has happened several times in the past. She reports associated nausea without vomiting. She denies fever, chills, headache, neck pain, chest pain, shortness of breath, vomiting, diarrhea, weakness, dizziness, to be, dysuria, hematuria.  LMP: unknown  Past Medical History  Diagnosis Date  . Hypertension   . Depression   . Anxiety    History reviewed. No pertinent past surgical history. Family History  Problem Relation Age of Onset  . Anesthesia problems Neg Hx   . Malignant hyperthermia Neg Hx   . Pseudochol deficiency Neg Hx   . Hypotension Neg Hx   . Schizophrenia Mother   . Hypertension Mother    Social History  Substance Use Topics  . Smoking status: Current Every Day Smoker -- 0.50 packs/day for 4 years    Types: Cigarettes  . Smokeless tobacco: None  . Alcohol Use: No   OB History    No data available     Review of Systems  Constitutional: Negative for fever, diaphoresis, appetite change, fatigue and unexpected weight change.  HENT: Negative for mouth sores.   Eyes: Negative for visual disturbance.  Respiratory: Negative for cough, chest tightness, shortness of breath and wheezing.   Cardiovascular: Negative  for chest pain.  Gastrointestinal: Positive for nausea and abdominal pain. Negative for vomiting, diarrhea and constipation.  Endocrine: Negative for polydipsia, polyphagia and polyuria.  Genitourinary: Negative for dysuria, urgency, frequency and hematuria.  Musculoskeletal: Negative for back pain and neck stiffness.       Leg cramping  Skin: Negative for rash.  Allergic/Immunologic: Negative for immunocompromised state.  Neurological: Negative for syncope, light-headedness and headaches.  Hematological: Does not bruise/bleed easily.  Psychiatric/Behavioral: Negative for sleep disturbance. The patient is not nervous/anxious.       Allergies  Review of patient's allergies indicates no known allergies.  Home Medications   Prior to Admission medications   Medication Sig Start Date End Date Taking? Authorizing Provider  ARIPiprazole (ABILIFY) 15 MG tablet Take 1 tablet (15 mg total) by mouth at bedtime. 08/24/15   Derrill Center, NP  ARIPiprazole 400 MG SUSR Inject 400 mg into the muscle every 30 (thirty) days. Prior authorization completed confirmation [id number] Next dose due Sep 14 2015 08/22/15   Ursula Alert, MD  benztropine (COGENTIN) 1 MG tablet Take 1 tablet (1 mg total) by mouth at bedtime. 08/24/15   Derrill Center, NP  hydrochlorothiazide (HYDRODIURIL) 25 MG tablet Take 1 tablet (25 mg total) by mouth daily. 08/24/15   Derrill Center, NP  hydrOXYzine (ATARAX/VISTARIL) 25 MG tablet Take 1 tablet (25 mg total) by mouth 3 (three) times daily as needed for anxiety. 08/24/15   Derrill Center, NP  lamoTRIgine (LAMICTAL) 25 MG tablet Take 1 tablet (25 mg total) by mouth 2 (  two) times daily. 08/24/15   Derrill Center, NP  potassium chloride SA (K-DUR,KLOR-CON) 20 MEQ tablet Take 1 tablet (20 mEq total) by mouth daily. 09/08/15   Leib Elahi, PA-C  traZODone (DESYREL) 150 MG tablet Take 1 tablet (150 mg total) by mouth at bedtime. 08/24/15   Derrill Center, NP   BP 142/81  mmHg  Pulse 86  Temp(Src) 97.8 F (36.6 C) (Oral)  Resp 18  SpO2 99% Physical Exam  Constitutional: She appears well-developed and well-nourished. No distress.  Awake, alert, nontoxic appearance  HENT:  Head: Normocephalic and atraumatic.  Mouth/Throat: Oropharynx is clear and moist. No oropharyngeal exudate.  Eyes: Conjunctivae are normal. No scleral icterus.  Neck: Normal range of motion. Neck supple.  Full ROM without pain  Cardiovascular: Normal rate, regular rhythm, normal heart sounds and intact distal pulses.   No murmur heard. Pulmonary/Chest: Effort normal and breath sounds normal. No respiratory distress. She has no wheezes.  Equal chest expansion  Abdominal: Soft. Bowel sounds are normal. She exhibits no distension and no mass. There is no tenderness. There is no rebound and no guarding.  Soft and nontender  Musculoskeletal: Normal range of motion. She exhibits no edema.  Full range of motion of the T-spine and L-spine No tenderness to palpation of the spinous processes of the T-spine or L-spine No tenderness to palpation of the paraspinous muscles of the L-spine Range of motion of all joints of the bilateral lower extremities including hips, knees and ankles; no swelling or erythema of any joints  Lymphadenopathy:    She has no cervical adenopathy.  Neurological: She is alert. She has normal reflexes.  Reflex Scores:      Bicep reflexes are 2+ on the right side and 2+ on the left side.      Brachioradialis reflexes are 2+ on the right side and 2+ on the left side.      Patellar reflexes are 2+ on the right side and 2+ on the left side.      Achilles reflexes are 2+ on the right side and 2+ on the left side. Speech is clear and goal oriented, follows commands Normal 5/5 strength in upper and lower extremities bilaterally including dorsiflexion and plantar flexion, strong and equal grip strength Sensation normal to light and sharp touch Moves extremities without ataxia,  coordination intact Normal gait Normal balance No Clonus   Skin: Skin is warm and dry. No rash noted. She is not diaphoretic. No erythema.  Psychiatric: She has a normal mood and affect. Her behavior is normal.  Nursing note and vitals reviewed.   ED Course  Procedures (including critical care time) Labs Review Labs Reviewed  CBC WITH DIFFERENTIAL/PLATELET - Abnormal; Notable for the following:    RBC 3.79 (*)    Hemoglobin 10.7 (*)    HCT 33.2 (*)    Platelets 435 (*)    All other components within normal limits  COMPREHENSIVE METABOLIC PANEL - Abnormal; Notable for the following:    Potassium 3.2 (*)    All other components within normal limits  URINALYSIS, ROUTINE W REFLEX MICROSCOPIC (NOT AT [facility]) - Abnormal; Notable for the following:    APPearance CLOUDY (*)    Hgb urine dipstick LARGE (*)    All other components within normal limits  URINE MICROSCOPIC-ADD ON - Abnormal; Notable for the following:    Squamous Epithelial / LPF 6-30 (*)    Bacteria, UA FEW (*)    All other components within normal limits  LIPASE, BLOOD  POC URINE PREG, ED    Imaging Review No results found. I have personally reviewed and evaluated these images and lab results as part of my medical decision-making.   EKG Interpretation None      MDM   Final diagnoses:  Hypokalemia  Generalized abdominal pain  Muscle cramps   Bonniejean N Mckinney presents with c/o leg and abd cramping.  Patient is nontoxic, nonseptic appearing, in no apparent distress.   Labs and vitals reviewed.  Mild hyperkalemia noted; repleted in the department. Mild anemia. Labs are otherwise reassuring. Pregnancy test is negative and no evidence of urinary tract infection. Patient does not meet the SIRS or Sepsis criteria.  On repeat exam patient abdomen is soft and nontender. No indication for imaging at this time..  No indication of appendicitis, bowel obstruction, bowel perforation, cholecystitis, diverticulitis, ectopic  pregnancy.  Patient discharged home with Klor-Con given strict instructions for follow-up with their primary care physician for repeat blood work within one week.  I have also discussed reasons to return immediately to the ER.  Patient expresses understanding and agrees with plan.  BP 142/81 mmHg  Pulse 86  Temp(Src) 97.8 F (36.6 C) (Oral)  Resp 18  SpO2 99%      Abigail Butts, PA-C 09/08/15 [id number]  Merryl Hacker, MD 09/08/15 [phone]

## 2015-09-08 LAB — CBC WITH DIFFERENTIAL/PLATELET
BASOS ABS: 0 10*3/uL (ref 0.0–0.1)
BASOS PCT: 0 %
EOS ABS: 0.2 10*3/uL (ref 0.0–0.7)
Eosinophils Relative: 2 %
HCT: 33.2 % — ABNORMAL LOW (ref 36.0–46.0)
HEMOGLOBIN: 10.7 g/dL — AB (ref 12.0–15.0)
Lymphocytes Relative: 36 %
Lymphs Abs: 2.6 10*3/uL (ref 0.7–4.0)
MCH: 28.2 pg (ref 26.0–34.0)
MCHC: 32.2 g/dL (ref 30.0–36.0)
MCV: 87.6 fL (ref 78.0–100.0)
MONO ABS: 0.6 10*3/uL (ref 0.1–1.0)
MONOS PCT: 8 %
NEUTROS ABS: 4 10*3/uL (ref 1.7–7.7)
NEUTROS PCT: 54 %
Platelets: 435 10*3/uL — ABNORMAL HIGH (ref 150–400)
RBC: 3.79 MIL/uL — AB (ref 3.87–5.11)
RDW: 15.3 % (ref 11.5–15.5)
WBC: 7.3 10*3/uL (ref 4.0–10.5)

## 2015-09-08 LAB — COMPREHENSIVE METABOLIC PANEL
ALBUMIN: 4 g/dL (ref 3.5–5.0)
ALK PHOS: 75 U/L (ref 38–126)
ALT: 19 U/L (ref 14–54)
AST: 25 U/L (ref 15–41)
Anion gap: 6 (ref 5–15)
BUN: 19 mg/dL (ref 6–20)
CALCIUM: 9.3 mg/dL (ref 8.9–10.3)
CO2: 27 mmol/L (ref 22–32)
CREATININE: 0.75 mg/dL (ref 0.44–1.00)
Chloride: 108 mmol/L (ref 101–111)
GFR calc Af Amer: >60 mL/min (ref >60)
GFR calc non Af Amer: >60 mL/min (ref >60)
GLUCOSE: 98 mg/dL (ref 65–99)
Potassium: 3.2 mmol/L — ABNORMAL LOW (ref 3.5–5.1)
SODIUM: 141 mmol/L (ref 135–145)
Total Bilirubin: 0.8 mg/dL (ref 0.3–1.2)
Total Protein: 7.4 g/dL (ref 6.5–8.1)

## 2015-09-08 LAB — URINE MICROSCOPIC-ADD ON

## 2015-09-08 LAB — URINALYSIS, ROUTINE W REFLEX MICROSCOPIC
BILIRUBIN URINE: NEGATIVE
Glucose, UA: NEGATIVE mg/dL
Ketones, ur: NEGATIVE mg/dL
Leukocytes, UA: NEGATIVE
NITRITE: NEGATIVE
PH: 6 (ref 5.0–8.0)
Protein, ur: NEGATIVE mg/dL
SPECIFIC GRAVITY, URINE: 1.029 (ref 1.005–1.030)

## 2015-09-08 LAB — LIPASE, BLOOD: Lipase: 26 U/L (ref 11–51)

## 2015-09-08 MED ORDER — POTASSIUM CHLORIDE CRYS ER 20 MEQ PO TBCR
40.0000 meq | EXTENDED_RELEASE_TABLET | Freq: Once | ORAL | Status: AC
  Administered 2015-09-08: 40 meq via ORAL
  Filled 2015-09-08: qty 2

## 2015-09-08 MED ORDER — POTASSIUM CHLORIDE CRYS ER 20 MEQ PO TBCR: 20.0000 meq | EXTENDED_RELEASE_TABLET | Freq: Every day | ORAL | Status: DC

## 2015-09-08 MED ORDER — IBUPROFEN 800 MG PO TABS
800.0000 mg | ORAL_TABLET | Freq: Once | ORAL | Status: AC
  Administered 2015-09-08: 800 mg via ORAL
  Filled 2015-09-08: qty 1

## 2015-09-08 NOTE — Discharge Instructions (Signed)
1. Medications: Klor Con, usual home medications 2. Treatment: rest, drink plenty of fluids,  3. Follow Up: Please followup with your primary doctor in 2 days for discussion of your diagnoses and further evaluation after today's visit; if you do not have a primary care doctor use the resource guide provided to find one; Please return to the ER for worsening symptoms   Leg Cramps Leg cramps occur when a muscle or muscles tighten and you have no control over this tightening (involuntary muscle contraction). Muscle cramps can develop in any muscle, but the most common place is in the calf muscles of the leg. Those cramps can occur during exercise or when you are at rest. Leg cramps are painful, and they may last for a few seconds to a few minutes. Cramps may return several times before they finally stop. Usually, leg cramps are not caused by a serious medical problem. In many cases, the cause is not known. Some common causes include:  Overexertion.  Overuse from repetitive motions, or doing the same thing over and over.  Remaining in a certain position for a long period of time.  Improper preparation, form, or technique while performing a sport or an activity.  Dehydration.  Injury.  Side effects of some medicines.  Abnormally low levels of the salts and ions in your blood (electrolytes), especially potassium and calcium. These levels could be low if you are taking water pills (diuretics) or if you are pregnant. HOME CARE INSTRUCTIONS Watch your condition for any changes. Taking the following actions may help to lessen any discomfort that you are feeling:  Stay well-hydrated. Drink enough fluid to keep your urine clear or pale yellow.  Try massaging, stretching, and relaxing the affected muscle. Do this for several minutes at a time.  For tight or tense muscles, use a warm towel, heating pad, or hot shower water directed to the affected area.  If you are sore or have pain after a cramp,  applying ice to the affected area may relieve discomfort.  Put ice in a plastic bag.  Place a towel between your skin and the bag.  Leave the ice on for 20 minutes, 2-3 times per day.  Avoid strenuous exercise for several days if you have been having frequent leg cramps.  Make sure that your diet includes the essential minerals for your muscles to work normally.  Take medicines only as directed by your health care provider. SEEK MEDICAL CARE IF:  Your leg cramps get more severe or more frequent, or they do not improve over time.  Your foot becomes cold, numb, or blue.   This information is not intended to replace advice given to you by your health care provider. Make sure you discuss any questions you have with your health care provider.   Document Released: 10/31/2004 Document Revised: 02/07/2015 Document Reviewed: 08/31/2014 Elsevier Interactive Patient Education [employer].

## 2015-09-10 ENCOUNTER — Encounter (HOSPITAL_COMMUNITY): Payer: Self-pay | Admitting: Emergency Medicine

## 2015-09-10 ENCOUNTER — Emergency Department (HOSPITAL_COMMUNITY)
Admission: EM | Admit: 2015-09-10 | Discharge: 2015-09-10 | Disposition: A | Discharge status: Home / Self Care | Payer: Medicaid Other | Attending: Emergency Medicine | Admitting: Emergency Medicine

## 2015-09-10 DIAGNOSIS — E876 Hypokalemia: Secondary | ICD-10-CM | POA: Insufficient documentation

## 2015-09-10 DIAGNOSIS — F329 Major depressive disorder, single episode, unspecified: Secondary | ICD-10-CM | POA: Diagnosis not present

## 2015-09-10 DIAGNOSIS — R1084 Generalized abdominal pain: Secondary | ICD-10-CM | POA: Diagnosis not present

## 2015-09-10 DIAGNOSIS — Z79899 Other long term (current) drug therapy: Secondary | ICD-10-CM | POA: Insufficient documentation

## 2015-09-10 DIAGNOSIS — I10 Essential (primary) hypertension: Secondary | ICD-10-CM | POA: Diagnosis not present

## 2015-09-10 DIAGNOSIS — F419 Anxiety disorder, unspecified: Secondary | ICD-10-CM | POA: Insufficient documentation

## 2015-09-10 DIAGNOSIS — F17210 Nicotine dependence, cigarettes, uncomplicated: Secondary | ICD-10-CM | POA: Insufficient documentation

## 2015-09-10 DIAGNOSIS — R109 Unspecified abdominal pain: Secondary | ICD-10-CM | POA: Diagnosis present

## 2015-09-10 LAB — COMPREHENSIVE METABOLIC PANEL
ALK PHOS: 75 U/L (ref 38–126)
ALT: 20 U/L (ref 14–54)
AST: 31 U/L (ref 15–41)
Albumin: 4.4 g/dL (ref 3.5–5.0)
Anion gap: 7 (ref 5–15)
BILIRUBIN TOTAL: 1.3 mg/dL — AB (ref 0.3–1.2)
BUN: 16 mg/dL (ref 6–20)
CALCIUM: 9.3 mg/dL (ref 8.9–10.3)
CHLORIDE: 104 mmol/L (ref 101–111)
CO2: 27 mmol/L (ref 22–32)
CREATININE: 0.75 mg/dL (ref 0.44–1.00)
Glucose, Bld: 96 mg/dL (ref 65–99)
Potassium: 3.2 mmol/L — ABNORMAL LOW (ref 3.5–5.1)
SODIUM: 138 mmol/L (ref 135–145)
TOTAL PROTEIN: 8 g/dL (ref 6.5–8.1)

## 2015-09-10 LAB — CBC
HEMATOCRIT: 36.8 % (ref 36.0–46.0)
Hemoglobin: 12 g/dL (ref 12.0–15.0)
MCH: 28.5 pg (ref 26.0–34.0)
MCHC: 32.6 g/dL (ref 30.0–36.0)
MCV: 87.4 fL (ref 78.0–100.0)
PLATELETS: 441 10*3/uL — AB (ref 150–400)
RBC: 4.21 MIL/uL (ref 3.87–5.11)
RDW: 15.1 % (ref 11.5–15.5)
WBC: 6.9 10*3/uL (ref 4.0–10.5)

## 2015-09-10 LAB — URINALYSIS, ROUTINE W REFLEX MICROSCOPIC
Bilirubin Urine: NEGATIVE
Glucose, UA: NEGATIVE mg/dL
KETONES UR: NEGATIVE mg/dL
LEUKOCYTES UA: NEGATIVE
NITRITE: NEGATIVE
PROTEIN: NEGATIVE mg/dL
Specific Gravity, Urine: 1.025 (ref 1.005–1.030)
pH: 5.5 (ref 5.0–8.0)

## 2015-09-10 LAB — URINE MICROSCOPIC-ADD ON: WBC UA: NONE SEEN WBC/hpf (ref 0–5)

## 2015-09-10 LAB — LIPASE, BLOOD: Lipase: 26 U/L (ref 11–51)

## 2015-09-10 MED ORDER — POTASSIUM CHLORIDE 20 MEQ/15ML (10%) PO SOLN
20.0000 meq | Freq: Once | ORAL | Status: AC
  Administered 2015-09-10: 20 meq via ORAL
  Filled 2015-09-10: qty 15

## 2015-09-10 MED ORDER — POTASSIUM CHLORIDE 20 MEQ/15ML (10%) PO SOLN: 20.0000 meq | Freq: Every day | ORAL | Status: DC

## 2015-09-10 MED ORDER — POTASSIUM CHLORIDE 20 MEQ PO PACK: 20.0000 meq | PACK | Freq: Once | ORAL | Status: DC

## 2015-09-10 NOTE — ED Notes (Addendum)
Pt brought in by [facility] c/o generalized abdominal pain. Pt also reports that she was seen here on 12/01 for hypokalemia and was prescribed  Potassium but was unable to get the script filled. Pt is requesting IV potassium and pain medication. She would like to be admitted as well. NAD.

## 2015-09-10 NOTE — ED Notes (Signed)
Pt ambulated to BR and back to room w/o assistance 

## 2015-09-10 NOTE — ED Notes (Signed)
Pt ambulating in room. No distress. Currently denies abdominal pain, nausea or vomiting. Requesting we order breakfast tray, and admit her for IV potassium replacement.  When asked about tolerating oral Kcl, she states, "I cant deal with prescriptions or getting them filled, I just want it through the IV so I dont have to worry about it."

## 2015-09-10 NOTE — ED Provider Notes (Signed)
CSN: [id number]     Arrival date & time 09/10/15  0358 History   First MD Initiated Contact with Patient 09/10/15 [phone]     Chief Complaint  Patient presents with  . Abdominal Pain   HPI   Sarah [name] is an 39 y.o. female with history of schizoaffective disorder, HTN, anxiety who presents to the ED for evaluation of hypokalemia. She states she was seen in the ED recently and was told she was hypokalemic but did not pick up her K-Dur rx because she did not want to take the pills. States she is here today because she wants IV K+ and to be admitted to the hospital. Her chief complaint is listed in EMR as "abdominal pain" but upon my interview she denies any pain. In the room she is walking around comfortably. She denies chest pain, abd pain, SOB, fever, chills. Denies n/v/d. She states she is here because she is worried about her potassium. She is requesting a breakfast tray.    Past Medical History  Diagnosis Date  . Hypertension   . Depression   . Anxiety    History reviewed. No pertinent past surgical history. Family History  Problem Relation Age of Onset  . Anesthesia problems Neg Hx   . Malignant hyperthermia Neg Hx   . Pseudochol deficiency Neg Hx   . Hypotension Neg Hx   . Schizophrenia Mother   . Hypertension Mother    Social History  Substance Use Topics  . Smoking status: Current Every Day Smoker -- 0.50 packs/day for 4 years    Types: Cigarettes  . Smokeless tobacco: None  . Alcohol Use: No   OB History    No data available     Review of Systems  All other systems reviewed and are negative.     Allergies  Review of patient's allergies indicates no known allergies.  Home Medications   Prior to Admission medications   Medication Sig Start Date End Date Taking? Authorizing Provider  ARIPiprazole (ABILIFY) 15 MG tablet Take 1 tablet (15 mg total) by mouth at bedtime. 08/24/15   Sarah Center, NP  ARIPiprazole 400 MG SUSR Inject 400 mg into the muscle every 30  (thirty) days. Prior authorization completed confirmation [id number] Next dose due Sep 14 2015 08/22/15   Ursula Alert, MD  benztropine (COGENTIN) 1 MG tablet Take 1 tablet (1 mg total) by mouth at bedtime. 08/24/15   Sarah Center, NP  hydrochlorothiazide (HYDRODIURIL) 25 MG tablet Take 1 tablet (25 mg total) by mouth daily. 08/24/15   Sarah Center, NP  hydrOXYzine (ATARAX/VISTARIL) 25 MG tablet Take 1 tablet (25 mg total) by mouth 3 (three) times daily as needed for anxiety. 08/24/15   Sarah Center, NP  lamoTRIgine (LAMICTAL) 25 MG tablet Take 1 tablet (25 mg total) by mouth 2 (two) times daily. 08/24/15   Sarah Center, NP  potassium chloride SA (K-DUR,KLOR-CON) 20 MEQ tablet Take 1 tablet (20 mEq total) by mouth daily. 09/08/15   Sarah Muthersbaugh, PA-C  traZODone (DESYREL) 150 MG tablet Take 1 tablet (150 mg total) by mouth at bedtime. 08/24/15   Sarah Center, NP   BP 146/79 mmHg  Pulse 88  Temp(Src) 98.5 F (36.9 C) (Oral)  Resp 18  Ht 5\' 5"  (1.651 m)  Wt 124.286 kg  BMI 45.60 kg/m2  SpO2 99% Physical Exam  Constitutional: She is oriented to person, place, and time.  HENT:  Right Ear: External ear normal.  Left Ear:  External ear normal.  Nose: Nose normal.  Mouth/Throat: Oropharynx is clear and moist. No oropharyngeal exudate.  Eyes: Conjunctivae and EOM are normal. Pupils are equal, round, and reactive to light.  Neck: Normal range of motion. Neck supple.  Cardiovascular: Normal rate, regular rhythm, normal heart sounds and intact distal pulses.   Pulmonary/Chest: Effort normal and breath sounds normal. No respiratory distress. She has no wheezes.  Abdominal: Soft. Bowel sounds are normal. She exhibits no distension. There is no tenderness. There is no rebound and no guarding.  Musculoskeletal: Normal range of motion. She exhibits no edema or tenderness.  Neurological: She is alert and oriented to person, place, and time.  Skin: Skin is warm and dry.   Psychiatric:  Odd affect.  Nursing note and vitals reviewed.   ED Course  Procedures (including critical care time) Labs Review Labs Reviewed  COMPREHENSIVE METABOLIC PANEL - Abnormal; Notable for the following:    Potassium 3.2 (*)    Total Bilirubin 1.3 (*)    All other components within normal limits  CBC - Abnormal; Notable for the following:    Platelets 441 (*)    All other components within normal limits  URINALYSIS, ROUTINE W REFLEX MICROSCOPIC (NOT AT [facility]) - Abnormal; Notable for the following:    APPearance CLOUDY (*)    Hgb urine dipstick MODERATE (*)    All other components within normal limits  URINE MICROSCOPIC-ADD ON - Abnormal; Notable for the following:    Squamous Epithelial / LPF 0-5 (*)    Bacteria, UA RARE (*)    All other components within normal limits  LIPASE, BLOOD    Imaging Review No results found. I have personally reviewed and evaluated these images and lab results as part of my medical decision-making.   EKG Interpretation None      MDM   Final diagnoses:  Hypokalemia  Generalized abdominal pain    Labs show mild hypokalemia at 3.2. OTherwise at pt's baseline. She is afebrile, not tachycardic. She has no complaints other than her concerns about her potassium. She has no indication for admission. She does not want to take K+ pill so I will order packet or liquid suspension for easier PO intake.  Upon re-eval pt states she does not want to take the liquid suspension. She states she now has abdominal pain and feels weak. She has again a nontender, unremarkable, non peritoneal abdominal exam. Her neuro exam is intact. I discussed extensively that we do not have a medical indication for admission and that she would do fine with outpatient K+ supplementation. She agrees and will take PO supplement here and Rx for liquid suspension at home. Will give resource guide for PCP f/u.     Anne Ng, PA-C 09/11/15 1131  Orpah Greek,  MD 09/17/15 [phone]

## 2015-09-10 NOTE — Discharge Instructions (Signed)
Your labs showed very mild low potassium. I am giving you a prescription for liquid potassium supplementation to take for the next few days. Please follow-up with your primary care doctor within one week to re-check. Your labs were otherwise normal.   Please obtain all of your results from medical records or have your doctors office obtain the results - share them with your doctor - you should be seen at your doctors office in the next 2 days. Call today to arrange your follow up. Take the medications as prescribed. Please review all of the medicines and only take them if you do not have an allergy to them. Please be aware that if you are taking birth control pills, taking other prescriptions, ESPECIALLY ANTIBIOTICS may make the birth control ineffective - if this is the case, either do not engage in sexual activity or use alternative methods of birth control such as condoms until you have finished the medicine and your family doctor says it is OK to restart them. If you are on a blood thinner such as COUMADIN, be aware that any other medicine that you take may cause the coumadin to either work too much, or not enough - you should have your coumadin level rechecked in next 7 days if this is the case.  ?  It is also a possibility that you have an allergic reaction to any of the medicines that you have been prescribed - Everybody reacts differently to medications and while MOST people have no trouble with most medicines, you may have a reaction such as nausea, vomiting, rash, swelling, shortness of breath. If this is the case, please stop taking the medicine immediately and contact your physician.  ?  You should return to the ER if you develop severe or worsening symptoms.

## 2015-09-11 ENCOUNTER — Encounter: Payer: Self-pay | Admitting: Internal Medicine

## 2015-09-18 ENCOUNTER — Emergency Department (HOSPITAL_COMMUNITY)
Admission: EM | Admit: 2015-09-18 | Discharge: 2015-09-18 | Disposition: A | Discharge status: Home / Self Care | Payer: Medicaid Other | Attending: Emergency Medicine | Admitting: Emergency Medicine

## 2015-09-18 ENCOUNTER — Encounter (HOSPITAL_COMMUNITY): Payer: Self-pay

## 2015-09-18 DIAGNOSIS — R197 Diarrhea, unspecified: Secondary | ICD-10-CM | POA: Diagnosis not present

## 2015-09-18 DIAGNOSIS — Z79899 Other long term (current) drug therapy: Secondary | ICD-10-CM | POA: Insufficient documentation

## 2015-09-18 DIAGNOSIS — F419 Anxiety disorder, unspecified: Secondary | ICD-10-CM | POA: Diagnosis not present

## 2015-09-18 DIAGNOSIS — E669 Obesity, unspecified: Secondary | ICD-10-CM | POA: Diagnosis not present

## 2015-09-18 DIAGNOSIS — F329 Major depressive disorder, single episode, unspecified: Secondary | ICD-10-CM | POA: Diagnosis not present

## 2015-09-18 DIAGNOSIS — R110 Nausea: Secondary | ICD-10-CM | POA: Insufficient documentation

## 2015-09-18 DIAGNOSIS — R1030 Lower abdominal pain, unspecified: Secondary | ICD-10-CM | POA: Diagnosis present

## 2015-09-18 DIAGNOSIS — D75839 Thrombocytosis, unspecified: Secondary | ICD-10-CM

## 2015-09-18 DIAGNOSIS — D473 Essential (hemorrhagic) thrombocythemia: Secondary | ICD-10-CM | POA: Diagnosis not present

## 2015-09-18 DIAGNOSIS — R109 Unspecified abdominal pain: Secondary | ICD-10-CM | POA: Diagnosis not present

## 2015-09-18 DIAGNOSIS — I10 Essential (primary) hypertension: Secondary | ICD-10-CM | POA: Diagnosis not present

## 2015-09-18 DIAGNOSIS — F17210 Nicotine dependence, cigarettes, uncomplicated: Secondary | ICD-10-CM | POA: Insufficient documentation

## 2015-09-18 LAB — LIPASE, BLOOD: LIPASE: 20 U/L (ref 11–51)

## 2015-09-18 LAB — CBC WITH DIFFERENTIAL/PLATELET
BASOS ABS: 0 10*3/uL (ref 0.0–0.1)
BASOS PCT: 0 %
Eosinophils Absolute: 0.2 10*3/uL (ref 0.0–0.7)
Eosinophils Relative: 3 %
HEMATOCRIT: 34.2 % — AB (ref 36.0–46.0)
Hemoglobin: 10.9 g/dL — ABNORMAL LOW (ref 12.0–15.0)
Lymphocytes Relative: 35 %
Lymphs Abs: 2.6 10*3/uL (ref 0.7–4.0)
MCH: 28.3 pg (ref 26.0–34.0)
MCHC: 31.9 g/dL (ref 30.0–36.0)
MCV: 88.8 fL (ref 78.0–100.0)
MONO ABS: 0.7 10*3/uL (ref 0.1–1.0)
Monocytes Relative: 9 %
NEUTROS ABS: 3.8 10*3/uL (ref 1.7–7.7)
NEUTROS PCT: 53 %
PLATELETS: 426 10*3/uL — AB (ref 150–400)
RBC: 3.85 MIL/uL — AB (ref 3.87–5.11)
RDW: 15.3 % (ref 11.5–15.5)
WBC: 7.3 10*3/uL (ref 4.0–10.5)

## 2015-09-18 LAB — COMPREHENSIVE METABOLIC PANEL
ALT: 19 U/L (ref 14–54)
AST: 31 U/L (ref 15–41)
Albumin: 3.5 g/dL (ref 3.5–5.0)
Alkaline Phosphatase: 69 U/L (ref 38–126)
Anion gap: 8 (ref 5–15)
BILIRUBIN TOTAL: 1 mg/dL (ref 0.3–1.2)
BUN: 12 mg/dL (ref 6–20)
CALCIUM: 8.9 mg/dL (ref 8.9–10.3)
CO2: 25 mmol/L (ref 22–32)
CREATININE: 0.75 mg/dL (ref 0.44–1.00)
Chloride: 107 mmol/L (ref 101–111)
GFR calc Af Amer: >60 mL/min (ref >60)
Glucose, Bld: 96 mg/dL (ref 65–99)
POTASSIUM: 3.4 mmol/L — AB (ref 3.5–5.1)
Sodium: 140 mmol/L (ref 135–145)
TOTAL PROTEIN: 6.6 g/dL (ref 6.5–8.1)

## 2015-09-18 LAB — URINALYSIS, ROUTINE W REFLEX MICROSCOPIC
BILIRUBIN URINE: NEGATIVE
GLUCOSE, UA: NEGATIVE mg/dL
Ketones, ur: 15 mg/dL — AB
Leukocytes, UA: NEGATIVE
Nitrite: NEGATIVE
Protein, ur: NEGATIVE mg/dL
SPECIFIC GRAVITY, URINE: 1.03 (ref 1.005–1.030)
pH: 6 (ref 5.0–8.0)

## 2015-09-18 LAB — URINE MICROSCOPIC-ADD ON: WBC, UA: NONE SEEN WBC/hpf (ref 0–5)

## 2015-09-18 MED ORDER — METHOCARBAMOL 500 MG PO TABS: 500.0000 mg | ORAL_TABLET | Freq: Three times a day (TID) | ORAL | Status: DC | PRN

## 2015-09-18 MED ORDER — BENZTROPINE MESYLATE 1 MG PO TABS: 1.0000 mg | ORAL_TABLET | Freq: Every day | ORAL | Status: DC

## 2015-09-18 NOTE — ED Notes (Signed)
Per EMS - pt c/o lower abd pain x 2 weeks, c/o increased generalized weakness today. Pt seen recently at [facility] for same symptoms.

## 2015-09-18 NOTE — Discharge Instructions (Signed)

## 2015-09-18 NOTE — ED Provider Notes (Addendum)
CSN: [id number]     Arrival date & time 09/18/15  [id number] History   By signing my name below, I, Sarah [name], attest that this documentation has been prepared under the direction and in the presence of Sarah Fuel, MD.  Electronically Signed: Forrestine [name], ED Scribe. 09/18/2015. 4:01 AM.   Chief Complaint  Patient presents with  . Abdominal Pain   The history is provided by the patient. No language interpreter was used.    HPI Comments: Sarah [name] brought in by EMS is a 39 y.o. female with a PMHx of HTN who presents to the Emergency Department complaining of constant, ongoing lower abdominal pain x 2 weeks. Currently she rates pain 10/10. Discomfort is exacerbated with certain movements and mildly alleviated when flat. She also reports ongoing nausea and an episode of diarrhea yesterday. No OTC medications or home remedies attempted prior to arrival. No recent fever, chills, vomiting, chest pain, or shortness of breath. Sarah [name] was evaluated on 12/4 for same. Pt was safely discharged home at that time but encouraged to take K+ supplemental at home.  PCP: [facility]    Past Medical History  Diagnosis Date  . Hypertension   . Depression   . Anxiety    History reviewed. No pertinent past surgical history. Family History  Problem Relation Age of Onset  . Anesthesia problems Neg Hx   . Malignant hyperthermia Neg Hx   . Pseudochol deficiency Neg Hx   . Hypotension Neg Hx   . Schizophrenia Mother   . Hypertension Mother    Social History  Substance Use Topics  . Smoking status: Current Every Day Smoker -- 0.50 packs/day for 4 years    Types: Cigarettes  . Smokeless tobacco: None  . Alcohol Use: No   OB History    No data available     Review of Systems  Constitutional: Negative for fever and chills.  Respiratory: Negative for shortness of breath.   Cardiovascular: Negative for chest pain.  Gastrointestinal: Positive for nausea, abdominal pain and diarrhea.  Negative for vomiting.  Musculoskeletal: Negative for back pain.  Neurological: Negative for headaches.  Psychiatric/Behavioral: Negative for confusion.  All other systems reviewed and are negative.     Allergies  Review of patient's allergies indicates no known allergies.  Home Medications   Prior to Admission medications   Medication Sig Start Date End Date Taking? Authorizing Provider  ARIPiprazole (ABILIFY) 15 MG tablet Take 1 tablet (15 mg total) by mouth at bedtime. 08/24/15   Derrill Center, NP  ARIPiprazole 400 MG SUSR Inject 400 mg into the muscle every 30 (thirty) days. Prior authorization completed confirmation [id number] Next dose due Sep 14 2015 08/22/15   Ursula Alert, MD  benztropine (COGENTIN) 1 MG tablet Take 1 tablet (1 mg total) by mouth at bedtime. 08/24/15   Derrill Center, NP  hydrochlorothiazide (HYDRODIURIL) 25 MG tablet Take 1 tablet (25 mg total) by mouth daily. 08/24/15   Derrill Center, NP  hydrOXYzine (ATARAX/VISTARIL) 25 MG tablet Take 1 tablet (25 mg total) by mouth 3 (three) times daily as needed for anxiety. 08/24/15   Derrill Center, NP  lamoTRIgine (LAMICTAL) 25 MG tablet Take 1 tablet (25 mg total) by mouth 2 (two) times daily. 08/24/15   Derrill Center, NP  potassium chloride 20 MEQ/15ML (10%) SOLN Take 15 mLs (20 mEq total) by mouth daily. 09/10/15   Olivia Canter Sam, PA-C  potassium chloride SA (K-DUR,KLOR-CON) 20 MEQ tablet Take  1 tablet (20 mEq total) by mouth daily. 09/08/15   Hannah Muthersbaugh, PA-C  traZODone (DESYREL) 150 MG tablet Take 1 tablet (150 mg total) by mouth at bedtime. 08/24/15   Derrill Center, NP   Triage Vitals: BP 141/91 mmHg  Pulse 72  Temp(Src) 97.6 F (36.4 C) (Oral)  Resp 16  SpO2 100%  LMP 08/27/2015   Physical Exam  Constitutional: She is oriented to person, place, and time. She appears well-developed and well-nourished. No distress.  Obese   HENT:  Head: Normocephalic and atraumatic.  Eyes: EOM are normal.   Neck: Normal range of motion.  Cardiovascular: Normal rate, regular rhythm and normal heart sounds.   Pulmonary/Chest: Effort normal and breath sounds normal.  Abdominal: Soft. She exhibits no distension. There is no tenderness.  Musculoskeletal: Normal range of motion.  Neurological: She is alert and oriented to person, place, and time.  Skin: Skin is warm and dry.  Psychiatric:  Flat affect   Nursing note and vitals reviewed.   ED Course  Procedures (including critical care time)  DIAGNOSTIC STUDIES: Oxygen Saturation is 100% on RA, Normal by my interpretation.    COORDINATION OF CARE: 3:55 AM- Will order CMP, Lipase, CBC, and urinalysis. Discussed treatment plan with pt at bedside and pt agreed to plan.     Labs Review Results for orders placed or performed during the hospital encounter of 09/18/15  Comprehensive metabolic panel  Result Value Ref Range   Sodium 140 135 - 145 mmol/L   Potassium 3.4 (L) 3.5 - 5.1 mmol/L   Chloride 107 101 - 111 mmol/L   CO2 25 22 - 32 mmol/L   Glucose, Bld 96 65 - 99 mg/dL   BUN 12 6 - 20 mg/dL   Creatinine, Ser 0.75 0.44 - 1.00 mg/dL   Calcium 8.9 8.9 - 10.3 mg/dL   Total Protein 6.6 6.5 - 8.1 g/dL   Albumin 3.5 3.5 - 5.0 g/dL   AST 31 15 - 41 U/L   ALT 19 14 - 54 U/L   Alkaline Phosphatase 69 38 - 126 U/L   Total Bilirubin 1.0 0.3 - 1.2 mg/dL   GFR calc non Af Amer >60 >60 mL/min   GFR calc Af Amer >60 >60 mL/min   Anion gap 8 5 - 15  Lipase, blood  Result Value Ref Range   Lipase 20 11 - 51 U/L  CBC with Differential  Result Value Ref Range   WBC 7.3 4.0 - 10.5 K/uL   RBC 3.85 (L) 3.87 - 5.11 MIL/uL   Hemoglobin 10.9 (L) 12.0 - 15.0 g/dL   HCT 34.2 (L) 36.0 - 46.0 %   MCV 88.8 78.0 - 100.0 fL   MCH 28.3 26.0 - 34.0 pg   MCHC 31.9 30.0 - 36.0 g/dL   RDW 15.3 11.5 - 15.5 %   Platelets 426 (H) 150 - 400 K/uL   Neutrophils Relative % 53 %   Neutro Abs 3.8 1.7 - 7.7 K/uL   Lymphocytes Relative 35 %   Lymphs Abs 2.6 0.7 -  4.0 K/uL   Monocytes Relative 9 %   Monocytes Absolute 0.7 0.1 - 1.0 K/uL   Eosinophils Relative 3 %   Eosinophils Absolute 0.2 0.0 - 0.7 K/uL   Basophils Relative 0 %   Basophils Absolute 0.0 0.0 - 0.1 K/uL  Urinalysis, Routine w reflex microscopic  Result Value Ref Range   Color, Urine AMBER (A) YELLOW   APPearance TURBID (A) CLEAR   Specific  Gravity, Urine 1.030 1.005 - 1.030   pH 6.0 5.0 - 8.0   Glucose, UA NEGATIVE NEGATIVE mg/dL   Hgb urine dipstick MODERATE (A) NEGATIVE   Bilirubin Urine NEGATIVE NEGATIVE   Ketones, ur 15 (A) NEGATIVE mg/dL   Protein, ur NEGATIVE NEGATIVE mg/dL   Nitrite NEGATIVE NEGATIVE   Leukocytes, UA NEGATIVE NEGATIVE  Urine microscopic-add on  Result Value Ref Range   Squamous Epithelial / LPF 6-30 (A) NONE SEEN   WBC, UA NONE SEEN 0 - 5 WBC/hpf   RBC / HPF 0-5 0 - 5 RBC/hpf   Bacteria, UA FEW (A) NONE SEEN   I have personally reviewed and evaluated these lab results as part of my medical decision-making.  MDM   Final diagnoses:  Abdominal pain, unspecified abdominal location  Thrombocytosis ([place])    Abdominal pain with a completely benign exam. Old records are reviewed and she has multiple prior ED visits for abdominal pain including on December 1 in December 4. She did have a CT of her abdomen and pelvis on November 6 which was unremarkable. Exam today is benign. Screening labs are obtained and significant for borderline hypokalemia and thrombocytosis which is chronic. Potassium is actually higher than it had been and her previous 2 ED visits. No evidence of serious intra-abdominal pathology and no indication for advanced imaging. She is discharged with instructions to follow-up with her PCP.  I personally performed the services described in this documentation, which was scribed in my presence. The recorded information has been reviewed and is accurate.      Sarah Fuel, MD [id number]  After talking with the patient, she is concerned that  she may be having some muscle spasms and has requested a prescription for a muscle relaxer. She is given a prescription for methocarbamol.  Sarah Fuel, MD [id number]

## 2015-09-18 NOTE — ED Notes (Signed)
Pt verbalized understanding of d/c instructions, prescriptions, and follow-up care. No further questions/concerns, VSS, assisted to lobby in wheelchair.  

## 2015-09-18 NOTE — ED Notes (Signed)
Pt ambulatory w/ steady gait to restroom. 

## 2015-10-12 ENCOUNTER — Encounter: Payer: Self-pay | Admitting: *Deleted

## 2015-11-09 ENCOUNTER — Ambulatory Visit: Payer: Self-pay | Admitting: Internal Medicine

## 2016-01-17 ENCOUNTER — Emergency Department (HOSPITAL_COMMUNITY)
Admission: EM | Admit: 2016-01-17 | Discharge: 2016-01-17 | Disposition: A | Discharge status: Home / Self Care | Payer: Medicaid Other | Attending: Emergency Medicine | Admitting: Emergency Medicine

## 2016-01-17 ENCOUNTER — Emergency Department (HOSPITAL_COMMUNITY): Payer: Medicaid Other

## 2016-01-17 ENCOUNTER — Encounter (HOSPITAL_COMMUNITY): Payer: Self-pay | Admitting: Emergency Medicine

## 2016-01-17 DIAGNOSIS — I10 Essential (primary) hypertension: Secondary | ICD-10-CM | POA: Insufficient documentation

## 2016-01-17 DIAGNOSIS — G4489 Other headache syndrome: Secondary | ICD-10-CM | POA: Insufficient documentation

## 2016-01-17 DIAGNOSIS — Z862 Personal history of diseases of the blood and blood-forming organs and certain disorders involving the immune mechanism: Secondary | ICD-10-CM | POA: Insufficient documentation

## 2016-01-17 DIAGNOSIS — F419 Anxiety disorder, unspecified: Secondary | ICD-10-CM | POA: Diagnosis not present

## 2016-01-17 DIAGNOSIS — E876 Hypokalemia: Secondary | ICD-10-CM | POA: Diagnosis not present

## 2016-01-17 DIAGNOSIS — Z79899 Other long term (current) drug therapy: Secondary | ICD-10-CM | POA: Diagnosis not present

## 2016-01-17 DIAGNOSIS — F17210 Nicotine dependence, cigarettes, uncomplicated: Secondary | ICD-10-CM | POA: Diagnosis not present

## 2016-01-17 DIAGNOSIS — F329 Major depressive disorder, single episode, unspecified: Secondary | ICD-10-CM | POA: Insufficient documentation

## 2016-01-17 DIAGNOSIS — H538 Other visual disturbances: Secondary | ICD-10-CM | POA: Diagnosis present

## 2016-01-17 DIAGNOSIS — Z880 Allergy status to penicillin: Secondary | ICD-10-CM | POA: Insufficient documentation

## 2016-01-17 HISTORY — DX: Migraine, unspecified, not intractable, without status migrainosus: G43.909

## 2016-01-17 HISTORY — DX: Benign intracranial hypertension: G93.2

## 2016-01-17 LAB — BASIC METABOLIC PANEL
Anion gap: 7 (ref 5–15)
BUN: 12 mg/dL (ref 6–20)
CHLORIDE: 113 mmol/L — AB (ref 101–111)
CO2: 20 mmol/L — AB (ref 22–32)
Calcium: 8.8 mg/dL — ABNORMAL LOW (ref 8.9–10.3)
Creatinine, Ser: 0.76 mg/dL (ref 0.44–1.00)
GFR calc Af Amer: >60 mL/min (ref >60)
GFR calc non Af Amer: >60 mL/min (ref >60)
GLUCOSE: 79 mg/dL (ref 65–99)
POTASSIUM: 3.6 mmol/L (ref 3.5–5.1)
Sodium: 140 mmol/L (ref 135–145)

## 2016-01-17 MED ORDER — DIPHENHYDRAMINE HCL 50 MG/ML IJ SOLN
25.0000 mg | Freq: Once | INTRAMUSCULAR | Status: AC
  Administered 2016-01-17: 25 mg via INTRAVENOUS
  Filled 2016-01-17: qty 1

## 2016-01-17 MED ORDER — SODIUM CHLORIDE 0.9 % IV SOLN
INTRAVENOUS | Status: DC
  Administered 2016-01-17: 18:00:00 via INTRAVENOUS

## 2016-01-17 MED ORDER — METOCLOPRAMIDE HCL 5 MG/ML IJ SOLN
10.0000 mg | Freq: Once | INTRAMUSCULAR | Status: AC
  Administered 2016-01-17: 10 mg via INTRAVENOUS
  Filled 2016-01-17: qty 2

## 2016-01-17 MED ORDER — ACETAZOLAMIDE ER 500 MG PO CP12: 500.0000 mg | ORAL_CAPSULE | Freq: Three times a day (TID) | ORAL | Status: DC

## 2016-01-17 NOTE — ED Notes (Signed)
Lab delay due to pt being in ct

## 2016-01-17 NOTE — ED Notes (Signed)
Patient transported to CT 

## 2016-01-17 NOTE — ED Notes (Signed)
Patient is alert and oriented x3.  She was given DC instructions and follow up visit instructions.  Patient gave verbal understanding. She was DC ambulatory under her own power to home.  V/S stable.  He was not showing any signs of distress on DC 

## 2016-01-17 NOTE — Discharge Instructions (Signed)
Start taking your Diamox 3 times a day and follow-up with the neurologist  Idiopathic Intracranial Hypertension Idiopathic intracranial hypertension (IIH) is a neurologic disorder that leads to increased pressure around your brain. It can cause vision loss and blindness if left untreated. RISK FACTORS IIH is most common in very overweight (obese) women of childbearing age. SIGNS AND SYMPTOMS  Symptoms of IIH include:  Headache.  Feeling of sickness in your stomach (nausea).  Vomiting.  A "rushing of water" sound within your ears (pulsatile tinnitus).  Double vision. DIAGNOSIS  Idiopathic intracranial hypertension is diagnosed with the aid of different exams:  Brain scans such as:  CT.  MRI.  MRV.  Diagnostic lumbar puncture. This procedure can determine if there is too much spinal fluid within the central nervous system. Too much spinal fluid can increase intracranial pressure.  A thorough eye exam will be done to look for swelling within the eyes. Visual field testing will also be done to see if any damage has occurred to nerves in the eyes. TREATMENT  Treatment of idiopathic intracranial hypertension is based on symptoms. Common treatments include:  Lumbar puncture to remove excess spinal fluid.  Medicine.  Surgery. HOME CARE INSTRUCTIONS The most important thing anyone can do to improve this condition is lose weight if they are overweight.  SEEK MEDICAL CARE IF:  You have changes in vision.  You have double vision.  You have loss of color vision. SEEK IMMEDIATE MEDICAL CARE IF:   Your headaches get worse rather than better.  Nausea or vomiting or both continue after treatment.  Your vision does not improve or gets worse after treatment. MAKE SURE YOU:  Understand these instructions.  Will watch your condition.  Will get help right away if you are not doing well or get worse.   This information is not intended to replace advice given to you by your  health care provider. Make sure you discuss any questions you have with your health care provider.   Document Released: 12/02/2001 Document Revised: 09/28/2013 Document Reviewed: 05/31/2013 Elsevier Interactive Patient Education [employer].

## 2016-01-17 NOTE — ED Provider Notes (Addendum)
CSN: [id number]     Arrival date & time 01/17/16  1602 History   First MD Initiated Contact with Patient 01/17/16 1707     Chief Complaint  Patient presents with  . Migraine  . Blurred Vision     (Consider location/radiation/quality/duration/timing/severity/associated sxs/prior Treatment) HPI Comments: Patient here complaining of mild frontal headache with slight blurred vision times several weeks but [place] bit worse the past 2 days. Patient states that she was diagnosed with hypertension in her head which sounds like certainly cerebri. She endorses that she has had a lumbar puncture in the past. Denies any vision loss or focal weakness. No neck pain. No fever or chills. Denies any photophobia. Does have a history of hypertension and has been on diuretics and she was recently switched from hydrochlorothiazide to acetazolamide. States her headache has persisted. Nothing makes it better  Patient is a 40 y.o. female presenting with migraines. The history is provided by the patient.  Migraine    Past Medical History  Diagnosis Date  . Hypertension   . Depression   . Anxiety   . Anemia   . Elevated bilirubin     slight elevation at 1.3  . Hypokalemia   . Intracranial hypertension   . Migraines    History reviewed. No pertinent past surgical history. Family History  Problem Relation Age of Onset  . Anesthesia problems Neg Hx   . Malignant hyperthermia Neg Hx   . Pseudochol deficiency Neg Hx   . Hypotension Neg Hx   . Schizophrenia Mother   . Hypertension Mother    Social History  Substance Use Topics  . Smoking status: Current Every Day Smoker -- 0.50 packs/day for 4 years    Types: Cigarettes  . Smokeless tobacco: None  . Alcohol Use: No   OB History    No data available     Review of Systems  All other systems reviewed and are negative.     Allergies  Penicillins  Home Medications   Prior to Admission medications   Medication Sig Start Date End Date Taking?  Authorizing Provider  acetaZOLAMIDE (DIAMOX) 500 MG capsule Take 500 mg by mouth 2 (two) times daily. 12/30/15  Yes Historical Provider, MD  amLODipine (NORVASC) 5 MG tablet Take 5 mg by mouth every morning. 12/30/15  Yes Historical Provider, MD  ARIPiprazole 400 MG SUSR Inject 400 mg into the muscle every 30 (thirty) days. Prior authorization completed confirmation [id number] Next dose due Sep 14 2015 08/22/15  Yes Ursula Alert, MD  hydrOXYzine (ATARAX/VISTARIL) 25 MG tablet Take 1 tablet (25 mg total) by mouth 3 (three) times daily as needed for anxiety. 08/24/15  Yes Derrill Center, NP  ibuprofen (ADVIL,MOTRIN) 200 MG tablet Take 400 mg by mouth every 6 (six) hours as needed for headache.   Yes Historical Provider, MD  lamoTRIgine (LAMICTAL) 25 MG tablet Take 1 tablet (25 mg total) by mouth 2 (two) times daily. 08/24/15  Yes Derrill Center, NP  traZODone (DESYREL) 150 MG tablet Take 1 tablet (150 mg total) by mouth at bedtime. Patient taking differently: Take 150 mg by mouth at bedtime as needed for sleep.  08/24/15  Yes Derrill Center, NP  benztropine (COGENTIN) 1 MG tablet Take 1 tablet (1 mg total) by mouth at bedtime. Patient not taking: Reported on 01/17/2016 123456   Delora Fuel, MD  hydrochlorothiazide (HYDRODIURIL) 25 MG tablet Take 1 tablet (25 mg total) by mouth daily. Patient not taking: Reported on 01/17/2016 08/24/15  Derrill Center, NP  methocarbamol (ROBAXIN) 500 MG tablet Take 1 tablet (500 mg total) by mouth every 8 (eight) hours as needed for muscle spasms. Patient not taking: Reported on 01/17/2016 123456   Delora Fuel, MD  potassium chloride 20 MEQ/15ML (10%) SOLN Take 15 mLs (20 mEq total) by mouth daily. Patient not taking: Reported on 01/17/2016 09/10/15   Olivia Canter Sam, PA-C   BP 154/94 mmHg  Pulse 56  Temp(Src) 97.9 F (36.6 C) (Oral)  Resp 20  SpO2 100% Physical Exam  Constitutional: She is oriented to person, place, and time. She appears well-developed and  well-nourished.  Non-toxic appearance. No distress.  HENT:  Head: Normocephalic and atraumatic.  Eyes: Conjunctivae, EOM and lids are normal. Pupils are equal, round, and reactive to light.  Neck: Normal range of motion. Neck supple. No tracheal deviation present. No thyroid mass present.  Cardiovascular: Normal rate, regular rhythm and normal heart sounds.  Exam reveals no gallop.   No murmur heard. Pulmonary/Chest: Effort normal and breath sounds normal. No stridor. No respiratory distress. She has no decreased breath sounds. She has no wheezes. She has no rhonchi. She has no rales.  Abdominal: Soft. Normal appearance and bowel sounds are normal. She exhibits no distension. There is no tenderness. There is no rebound and no CVA tenderness.  Musculoskeletal: Normal range of motion. She exhibits no edema or tenderness.  Neurological: She is alert and oriented to person, place, and time. She has normal strength. No cranial nerve deficit or sensory deficit. GCS eye subscore is 4. GCS verbal subscore is 5. GCS motor subscore is 6.  Skin: Skin is warm and dry. No abrasion and no rash noted.  Psychiatric: She has a normal mood and affect. Her speech is normal and behavior is normal.  Nursing note and vitals reviewed.   ED Course  Procedures (including critical care time) Labs Review Labs Reviewed  BASIC METABOLIC PANEL    Imaging Review No results found. I have personally reviewed and evaluated these images and lab results as part of my medical decision-making.   EKG Interpretation None      MDM   Final diagnoses:  None    Discussed with neurology and recommends increasing the patient's Diamox dose to 500 mg 3 times a day and referring the patient to the office.  7:03 PM Patient's visit acuity noted. Patient has symptoms of her right eye which has 20/25 vision. She has no evidence of visual field defects.  Patient reassessed the headache is much better at this time. Repeat  neurological exam at time of discharge remained stable.  Lacretia Leigh, MD 01/17/16 1903  Lacretia Leigh, MD 01/17/16 [phone]

## 2016-01-17 NOTE — ED Notes (Addendum)
Per EMS, diagnosed with "hypertension in the head" causing her to have headaches a year ago. HCTZ has been helping. Went to hospital in [place] 3 weeks ago for migraine with blurred vision, they took her off the HCTZ and placed her on Acetazolamide. States the new medication isn't helping as much now. New headache today and blurred vision to right eye. No other neuro deficits observed in triage.

## 2016-01-21 IMAGING — CT CT RENAL STONE PROTOCOL
2 of 4 series · 17 of 46 positions shown, 19 images · non-contrast
Comparison: None.

CLINICAL DATA: Intermittent sharp left upper quadrant abdominal
pain, onset tonight.

EXAM:
CT ABDOMEN AND PELVIS WITHOUT CONTRAST
TECHNIQUE: Multidetector CT imaging of the abdomen and pelvis was performed
following the standard protocol without IV contrast.

[Series 2: renal stone 5mm · axial · 0.97mm/px · z∈[+1040,+1450]mm · 14 of 90 slices shown, 16 images]
[im 4/90  soft-tissue]
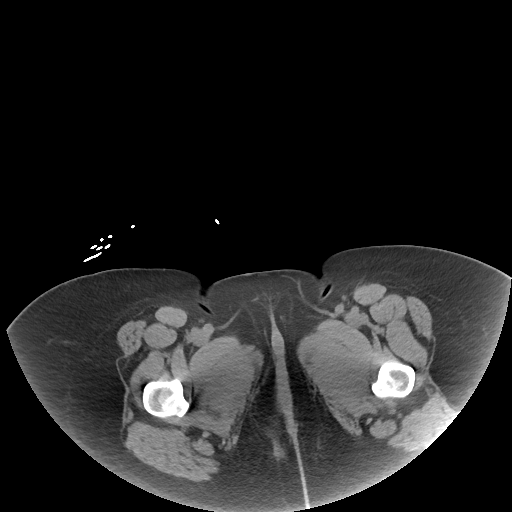
[im 4/90  bone]
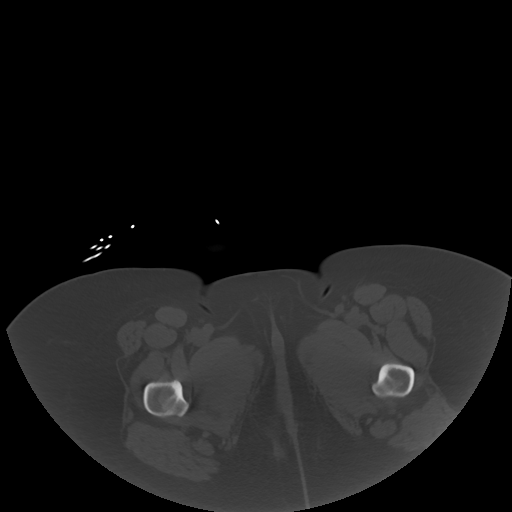
[im 12/90  soft-tissue]
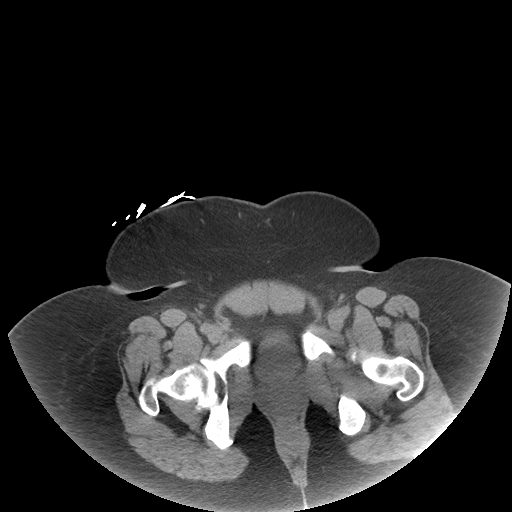
[im 16/90  soft-tissue]
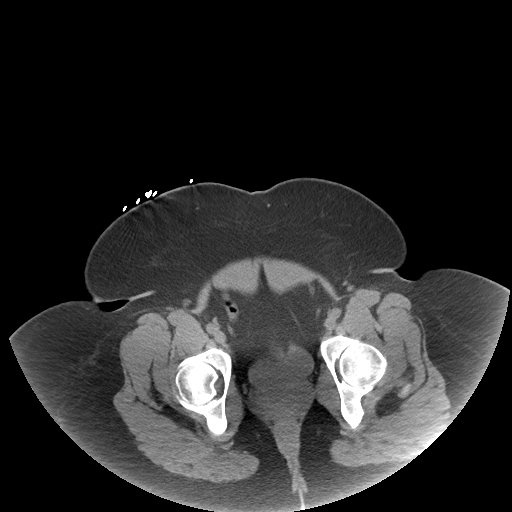
[im 24/90  soft-tissue]
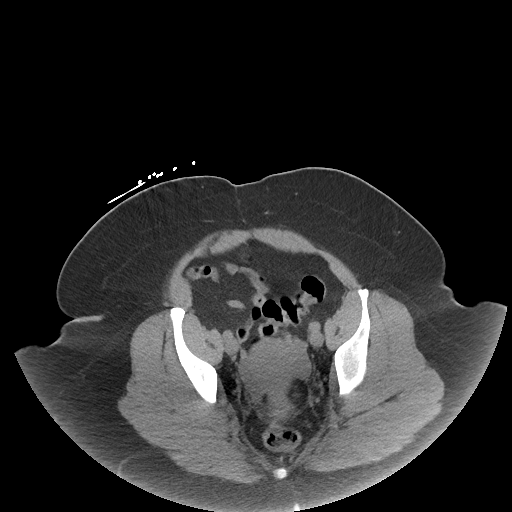
[im 31/90  soft-tissue]
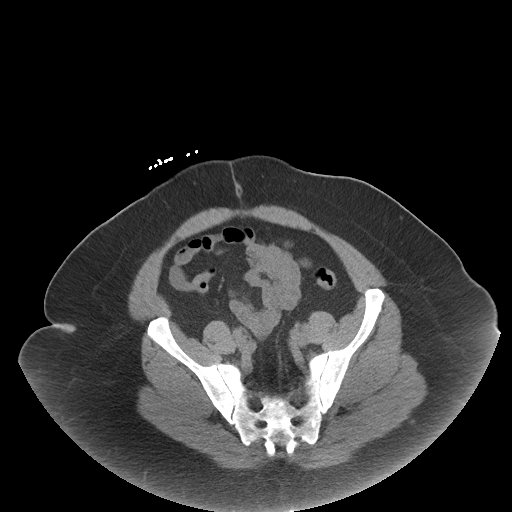
[im 35/90  soft-tissue]
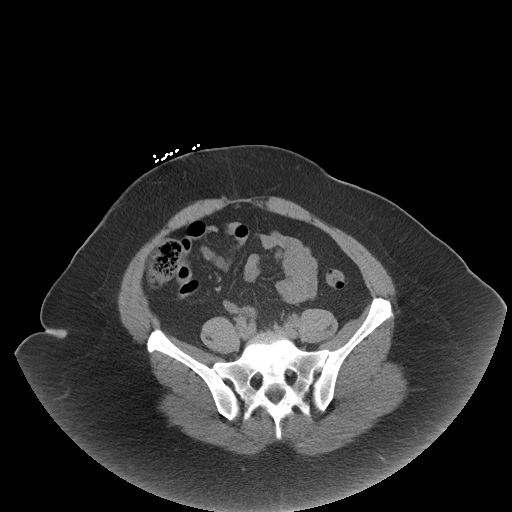
[im 43/90  soft-tissue]
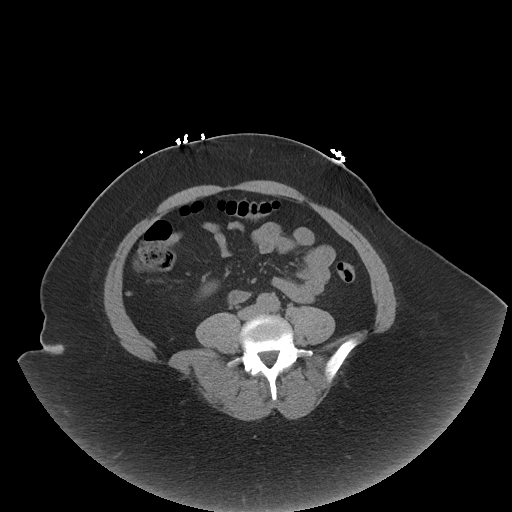
[im 47/90  soft-tissue]
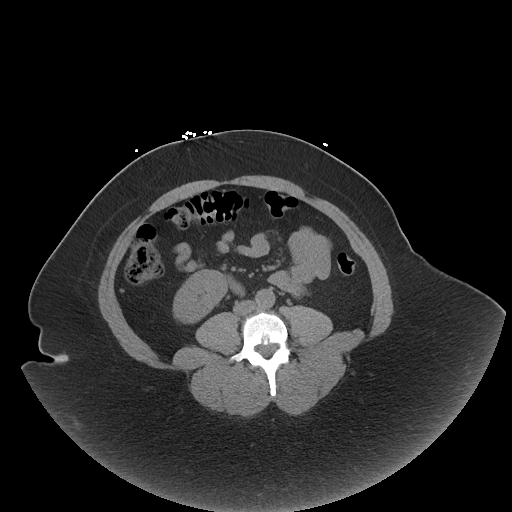
[im 55/90  soft-tissue]
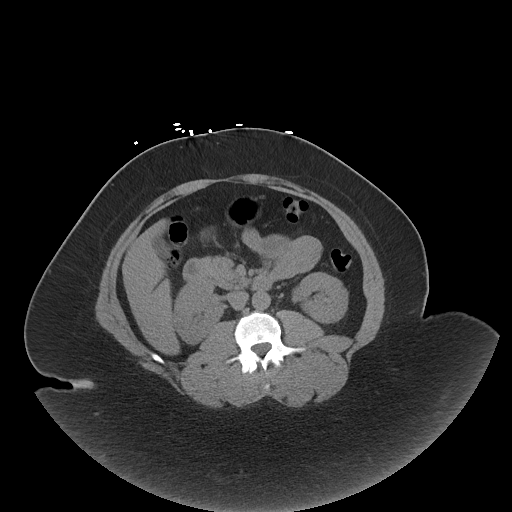
[im 55/90  bone]
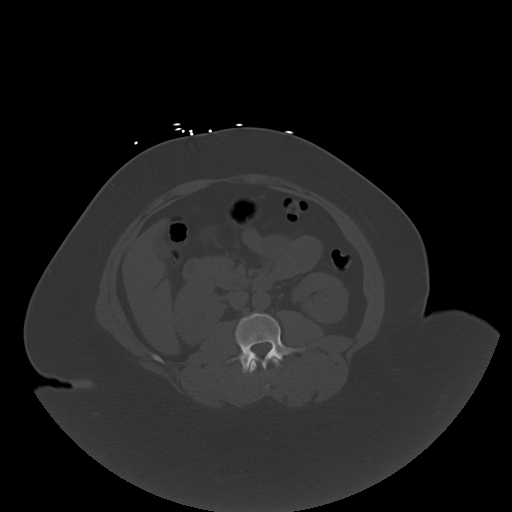
[im 59/90  soft-tissue]
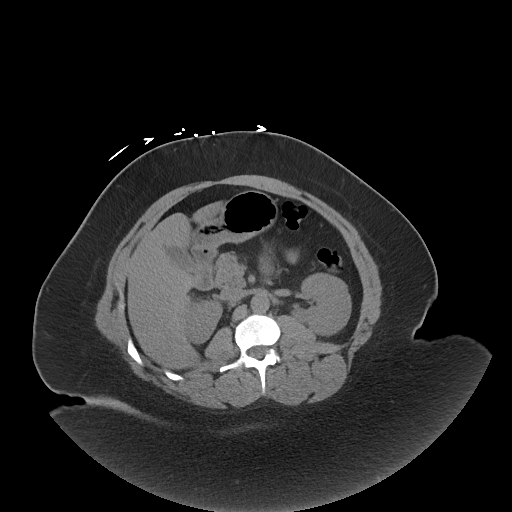
[im 66/90  soft-tissue]
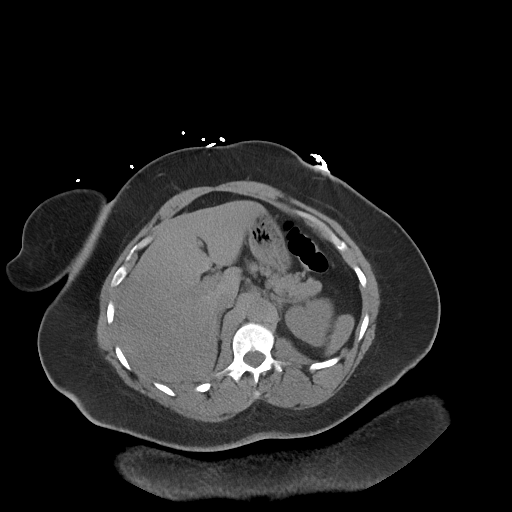
[im 74/90  soft-tissue]
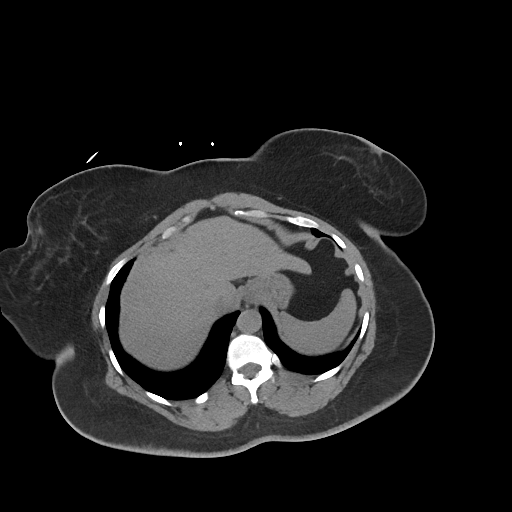
[im 78/90  soft-tissue]
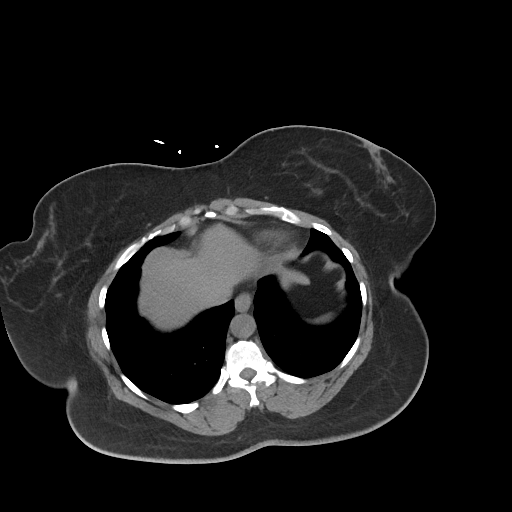
[im 86/90  soft-tissue]
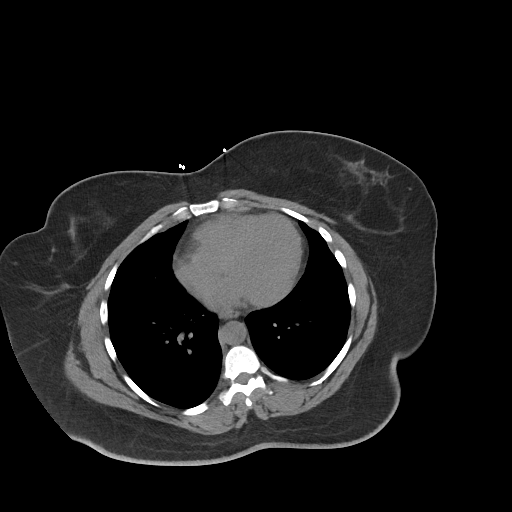

[Series 4: renal stone 3.0 cor · coronal · 0.85mm/px · 3 of 122 slices shown]
[im 41/122  soft-tissue]
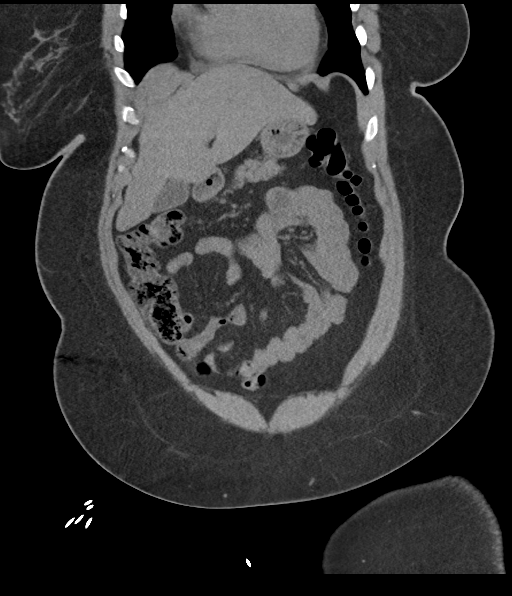
[im 54/122  soft-tissue]
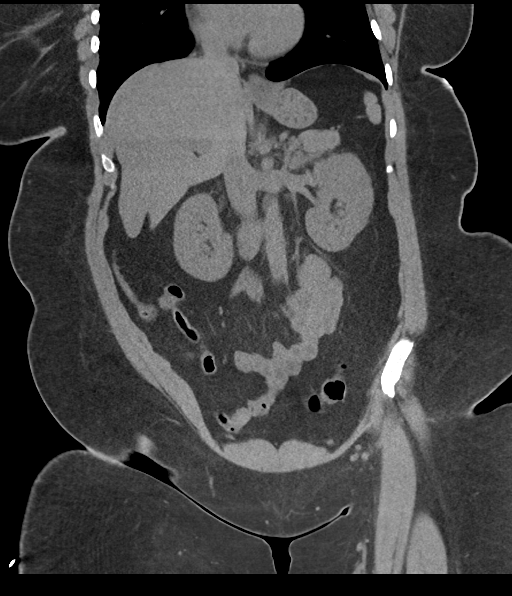
[im 68/122  soft-tissue]
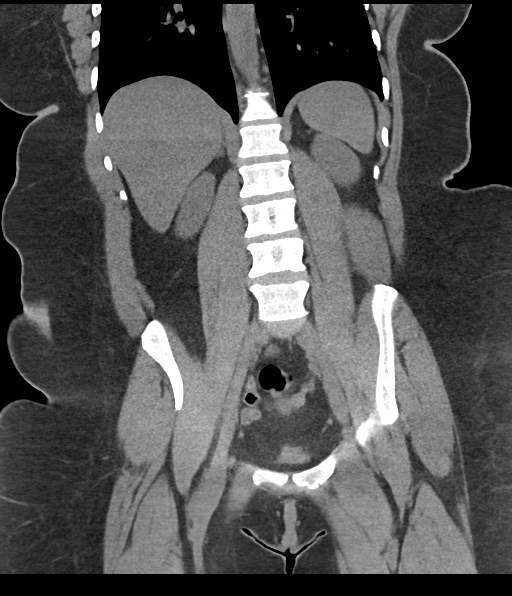

[17 of 46 positions shown; findings below may reference images not displayed]

FINDINGS: There is no urinary calculus. There is no hydronephrosis or ureteral
dilatation. There are unremarkable unenhanced appearances of the
liver, spleen, pancreas, adrenals and kidneys. There are normal
appearances of the stomach, small bowel and colon. The appendix is
normal. The abdominal aorta is normal in caliber. There is no
atherosclerotic calcification. There is no adenopathy in the abdomen
or pelvis. Uterus and ovaries are unremarkable.

No acute inflammatory changes are evident in the abdomen or pelvis.
There is no ascites. There is no significant musculoskeletal lesion.
There is no significant abnormality in the lower chest. There is a
tiny fat containing umbilical hernia.
IMPRESSION: No acute findings in the abdomen or pelvis. Tiny fat containing
umbilical hernia.

## 2016-01-24 ENCOUNTER — Ambulatory Visit (INDEPENDENT_AMBULATORY_CARE_PROVIDER_SITE_OTHER): Payer: Medicaid Other | Admitting: Neurology

## 2016-01-24 ENCOUNTER — Encounter: Payer: Self-pay | Admitting: Neurology

## 2016-01-24 VITALS — BP 160/110 | HR 67 | Ht 65.5 in | Wt 239.8 lb

## 2016-01-24 DIAGNOSIS — G932 Benign intracranial hypertension: Secondary | ICD-10-CM | POA: Diagnosis not present

## 2016-01-24 DIAGNOSIS — H4710 Unspecified papilledema: Secondary | ICD-10-CM

## 2016-01-24 DIAGNOSIS — H05119 Granuloma of unspecified orbit: Secondary | ICD-10-CM

## 2016-01-24 DIAGNOSIS — H539 Unspecified visual disturbance: Secondary | ICD-10-CM | POA: Diagnosis not present

## 2016-01-24 MED ORDER — ACETAZOLAMIDE ER 500 MG PO CP12: 500.0000 mg | ORAL_CAPSULE | Freq: Three times a day (TID) | ORAL | Status: DC

## 2016-01-24 NOTE — Patient Instructions (Signed)
Overall you are doing fairly well but I do want to suggest a few things today:   Remember to drink plenty of fluid, eat healthy meals and do not skip any meals. Try to eat protein with a every meal and eat a healthy snack such as fruit or nuts in between meals. Try to keep a regular sleep-wake schedule and try to exercise daily, particularly in the form of walking, 20-30 minutes a day, if you can.   As far as your medications are concerned, I would like to suggest: continue Diamox  As far as diagnostic testing: Referral to eye doctor, MRI, need records  I would like to see you back in 2-3 months, sooner if we need to. Please call us with any interim questions, concerns, problems, updates or refill requests.   Please also call us for any test results so we can go over those with you on the phone.  My clinical assistant and will answer any of your questions and relay your messages to me and also relay most of my messages to you.   Our phone number is [phone]. We also have an after hours call service for urgent matters and there is a physician on-call for urgent questions. For any emergencies you know to call 911 or go to the nearest emergency room

## 2016-01-24 NOTE — Progress Notes (Signed)
[facility]    Provider:  Dr Jaynee Eagles Referring Provider: Pa, [facility] Primary Care Physician:  [facility]  CC:  IIH  HPI:  Sarah [name] is a 40 y.o. female here as a referral from Dr. Beaulah Corin for pseudotumor cerebri diagnosed while on vacation in [place] last month and placed on diamox. PMHx HTN, migraine, depression, anxiety, intellectual disability, schizoaffective disorder bipolar type. She is here with her fiance who also provides information. She was having headache, she was in the mental health hospital, her BP was very high and she had a CT and they figured it out. She was having bad headaches. The headaches started a few months ago. It feels like severe pressure around the head, worse supine, , she has vision changes, blurry vision, the blurry vision comes and goes. No inciting events, no trauma to the head, she has been having high blood pressure however. They did a lumbar puncture at the hospital. She has not seen an eye doctor. They told her to follow up in [place]. She is taking 500mg  of diamox three times a day. Her headache feels better since starting the medication. She is morbidly obese but denies any recent weight gain. She denies having an MRi of the brain. No side effects from the diamox. No other focal neurologic deficits.  Reviewed notes, labs and imaging from outside physicians, which showed:  CT of the head 01/17/2016: personally reviewed images and agree with the following:  FINDINGS: No acute intracranial abnormality. Specifically, no hemorrhage, hydrocephalus, mass lesion, acute infarction, or significant intracranial injury. No acute calvarial abnormality.  Mucosal thickening in the sphenoid sinuses and maxillary sinuses. No air-fluid levels. Mastoid air cells are clear.  IMPRESSION: No intracranial abnormality. Chronic sinusitis.  BMP abnormal with CO2 20 and Cl 123456 (likely metabolic acidosis from diamox)  Review of  Systems: Patient complains of symptoms per HPI as well as the following symptoms: feeling hot, feeling cold. Pertinent negatives per HPI. All others negative.   Social History   Social History  . Marital Status: Single    Spouse Name: N/A  . Number of Children: 0  . Years of Education: 12   Occupational History  . Unemployed    Social History Main Topics  . Smoking status: Current Every Day Smoker -- 0.50 packs/day for 4 years    Types: Cigarettes  . Smokeless tobacco: Not on file  . Alcohol Use: No  . Drug Use: Yes    Special: Cocaine, Marijuana  . Sexual Activity: Not on file   Other Topics Concern  . Not on file   Social History Narrative   Lives with fiance, Erlene Quan   Caffeine use: Soda: 1 soda daily   No coffee   No tea    Family History  Problem Relation Age of Onset  . Anesthesia problems Neg Hx   . Malignant hyperthermia Neg Hx   . Pseudochol deficiency Neg Hx   . Hypotension Neg Hx   . Schizophrenia Mother   . Hypertension Mother     Past Medical History  Diagnosis Date  . Hypertension   . Depression   . Anxiety   . Anemia   . Elevated bilirubin     slight elevation at 1.3  . Hypokalemia   . Intracranial hypertension   . Migraines     Past Surgical History  Procedure Laterality Date  . No past surgeries      Current Outpatient Prescriptions  Medication Sig Dispense Refill  .  acetaZOLAMIDE (DIAMOX) 500 MG capsule Take 1 capsule (500 mg total) by mouth 3 (three) times daily. 90 capsule 0  . amLODipine (NORVASC) 5 MG tablet Take 5 mg by mouth every morning.    . ARIPiprazole 400 MG SUSR Inject 400 mg into the muscle every 30 (thirty) days. Prior authorization completed confirmation [id number] Next dose due Sep 14 2015 1 each   . benztropine (COGENTIN) 1 MG tablet Take 1 tablet (1 mg total) by mouth at bedtime. 30 tablet 0  . hydrochlorothiazide (HYDRODIURIL) 25 MG tablet Take 1 tablet (25 mg total) by mouth daily. 30 tablet 0  .  hydrOXYzine (ATARAX/VISTARIL) 25 MG tablet Take 1 tablet (25 mg total) by mouth 3 (three) times daily as needed for anxiety. 30 tablet 0  . ibuprofen (ADVIL,MOTRIN) 200 MG tablet Take 400 mg by mouth every 6 (six) hours as needed for headache.    . lamoTRIgine (LAMICTAL) 25 MG tablet Take 1 tablet (25 mg total) by mouth 2 (two) times daily. 30 tablet 0  . methocarbamol (ROBAXIN) 500 MG tablet Take 1 tablet (500 mg total) by mouth every 8 (eight) hours as needed for muscle spasms. 40 tablet 0  . traZODone (DESYREL) 150 MG tablet Take 1 tablet (150 mg total) by mouth at bedtime. (Patient taking differently: Take 150 mg by mouth at bedtime as needed for sleep. ) 30 tablet 0   No current facility-administered medications for this visit.    Allergies as of 01/24/2016 - Review Complete 01/24/2016  Allergen Reaction Noted  . Penicillins Anaphylaxis 01/17/2016    Vitals: BP 160/110 mmHg  Pulse 67  Ht 5' 5.5" (1.664 m)  Wt 239 lb 12.8 oz (108.773 kg)  BMI 39.28 kg/m2  SpO2 98%  LMP 01/17/2016 Last Weight:  Wt Readings from Last 1 Encounters:  01/24/16 239 lb 12.8 oz (108.773 kg)   Last Height:   Ht Readings from Last 1 Encounters:  01/24/16 5' 5.5" (1.664 m)    Physical exam: Exam: Gen: NAD, conversant, well nourised, obese, well groomed                     CV: RRR, no MRG. No Carotid Bruits. No peripheral edema, warm, nontender Eyes: Conjunctivae clear without exudates or hemorrhage  Neuro: Detailed Neurologic Exam  Speech:    Speech is normal; fluent and spontaneous with normal comprehension.  Cognition:    The patient is oriented to person, place, and time;     recent and remote memory intact;     language fluent;     normal attention, concentration,     fund of knowledge Cranial Nerves:    The pupils are equal, round, and reactive to light. The fundi showed blurred nasal margins otherwise unremarkable. Visual fields are full to finger confrontation. Extraocular movements  are intact. Trigeminal sensation is intact and the muscles of mastication are normal. The face is symmetric. The palate elevates in the midline. Hearing intact. Voice is normal. Shoulder shrug is normal. The tongue has normal motion without fasciculations.   Coordination:    Normal finger to nose and heel to shin. Normal rapid alternating movements.   Gait:    Heel-toe and tandem gait are normal.   Motor Observation:    No asymmetry, no atrophy, and no involuntary movements noted. Tone:    Normal muscle tone.    Posture:    Posture is normal. normal erect    Strength:    Strength is V/V in the upper  and lower limbs.      Sensation: intact to LT     Reflex Exam:  DTR's:    Deep tendon reflexes in the upper and lower extremities are normal bilaterally.   Toes:    The toes are downgoing bilaterally.   Clonus:    Clonus is absent.       Assessment/Plan:  40 year old diagnosed with IIH in [place] last month. I don't have very much information, requesting records, apparently had an LP (unknown opening pressure) and started on diamox, feeling better now on the medication.  As far as your medications are concerned, I would like to suggest: continue Diamox  As far as diagnostic testing: Referral to ophthalmologist, MRI of the brain, orbits and MRV of the head, need records have requested  I would like to see you back in 2-3 months, sooner if we need to. Please call us with any interim questions, concerns, problems, updates or refill requests.   To prevent or relieve headaches, try the following: Cool Compress. Lie down and place a cool compress on your head.  Avoid headache triggers. If certain foods or odors seem to have triggered your migraines in the past, avoid them. A headache diary might help you identify triggers.  Include physical activity in your daily routine. Try a daily walk or other moderate aerobic exercise.  Manage stress. Find healthy ways to cope with the  stressors, such as delegating tasks on your to-do list.  Practice relaxation techniques. Try deep breathing, yoga, massage and visualization.  Eat regularly. Eating regularly scheduled meals and maintaining a healthy diet might help prevent headaches. Also, drink plenty of fluids.  Follow a regular sleep schedule. Sleep deprivation might contribute to headaches Consider biofeedback. With this mind-body technique, you learn to control certain bodily functions - such as muscle tension, heart rate and blood pressure - to prevent headaches or reduce headache pain.    Proceed to emergency room if you experience new or worsening symptoms or symptoms do not resolve, if you have new neurologic symptoms or if headache is severe, or for any concerning symptom.    Sarina Ill, MD  [facility] [address] [place], [place] [address]  Phone [phone] Fax [phone]

## 2016-02-02 ENCOUNTER — Ambulatory Visit
Admission: RE | Admit: 2016-02-02 | Discharge: 2016-02-02 | Disposition: A | Discharge status: Home / Self Care | Payer: Medicaid Other | Source: Ambulatory Visit | Attending: Neurology | Admitting: Neurology

## 2016-02-02 DIAGNOSIS — H05119 Granuloma of unspecified orbit: Secondary | ICD-10-CM

## 2016-02-02 DIAGNOSIS — H539 Unspecified visual disturbance: Secondary | ICD-10-CM

## 2016-02-02 DIAGNOSIS — H4710 Unspecified papilledema: Secondary | ICD-10-CM

## 2016-02-02 DIAGNOSIS — G932 Benign intracranial hypertension: Secondary | ICD-10-CM

## 2016-02-02 MED ORDER — GADOBENATE DIMEGLUMINE 529 MG/ML IV SOLN
20.0000 mL | Freq: Once | INTRAVENOUS | Status: AC | PRN
  Administered 2016-02-02: 20 mL via INTRAVENOUS

## 2016-02-03 ENCOUNTER — Encounter: Payer: Self-pay | Admitting: Neurology

## 2016-02-06 ENCOUNTER — Telehealth: Payer: Self-pay | Admitting: *Deleted

## 2016-02-06 NOTE — Telephone Encounter (Signed)
Called patient; she was not home. Individual who answered stated to call back later.

## 2016-02-06 NOTE — Telephone Encounter (Signed)
Attempted to reach patient. Did not LVM due to being unable to understand person's name on voice mail.

## 2016-02-07 NOTE — Telephone Encounter (Signed)
Spoke with patient and informed her, per Dr Jaynee Eagles, her  MRI of the brain is consistent with intracranial hypertension (increased fluid in the brain). She should continue to take her diamox and follow up with Dr Jaynee Eagles 04/03/16, 2:00 pm. Patient verbalized understanding, had no questions.

## 2016-04-01 ENCOUNTER — Emergency Department (HOSPITAL_COMMUNITY)
Admission: EM | Admit: 2016-04-01 | Discharge: 2016-04-01 | Disposition: A | Discharge status: Home / Self Care | Payer: Medicaid Other | Attending: Emergency Medicine | Admitting: Emergency Medicine

## 2016-04-01 ENCOUNTER — Emergency Department (HOSPITAL_COMMUNITY): Payer: Medicaid Other

## 2016-04-01 ENCOUNTER — Encounter (HOSPITAL_COMMUNITY): Payer: Self-pay

## 2016-04-01 DIAGNOSIS — R112 Nausea with vomiting, unspecified: Secondary | ICD-10-CM

## 2016-04-01 DIAGNOSIS — R1084 Generalized abdominal pain: Secondary | ICD-10-CM | POA: Diagnosis not present

## 2016-04-01 DIAGNOSIS — Z7982 Long term (current) use of aspirin: Secondary | ICD-10-CM | POA: Diagnosis not present

## 2016-04-01 DIAGNOSIS — I10 Essential (primary) hypertension: Secondary | ICD-10-CM | POA: Diagnosis not present

## 2016-04-01 DIAGNOSIS — Z79899 Other long term (current) drug therapy: Secondary | ICD-10-CM | POA: Insufficient documentation

## 2016-04-01 DIAGNOSIS — F17210 Nicotine dependence, cigarettes, uncomplicated: Secondary | ICD-10-CM | POA: Diagnosis not present

## 2016-04-01 LAB — COMPREHENSIVE METABOLIC PANEL
ALBUMIN: 4 g/dL (ref 3.5–5.0)
ALK PHOS: 66 U/L (ref 38–126)
ALT: 9 U/L — ABNORMAL LOW (ref 14–54)
ANION GAP: 5 (ref 5–15)
AST: 14 U/L — ABNORMAL LOW (ref 15–41)
BILIRUBIN TOTAL: 1 mg/dL (ref 0.3–1.2)
BUN: 14 mg/dL (ref 6–20)
CALCIUM: 8.4 mg/dL — AB (ref 8.9–10.3)
CO2: 25 mmol/L (ref 22–32)
Chloride: 108 mmol/L (ref 101–111)
Creatinine, Ser: 0.66 mg/dL (ref 0.44–1.00)
GFR calc non Af Amer: >60 mL/min (ref >60)
GLUCOSE: 84 mg/dL (ref 65–99)
POTASSIUM: 3.3 mmol/L — AB (ref 3.5–5.1)
Sodium: 138 mmol/L (ref 135–145)
TOTAL PROTEIN: 7.1 g/dL (ref 6.5–8.1)

## 2016-04-01 LAB — URINALYSIS, ROUTINE W REFLEX MICROSCOPIC
BILIRUBIN URINE: NEGATIVE
GLUCOSE, UA: NEGATIVE mg/dL
Ketones, ur: NEGATIVE mg/dL
Leukocytes, UA: NEGATIVE
Nitrite: NEGATIVE
Protein, ur: NEGATIVE mg/dL
SPECIFIC GRAVITY, URINE: 1.016 (ref 1.005–1.030)
pH: 6 (ref 5.0–8.0)

## 2016-04-01 LAB — CBC
HEMATOCRIT: 31.7 % — AB (ref 36.0–46.0)
HEMOGLOBIN: 10.6 g/dL — AB (ref 12.0–15.0)
MCH: 28 pg (ref 26.0–34.0)
MCHC: 33.4 g/dL (ref 30.0–36.0)
MCV: 83.6 fL (ref 78.0–100.0)
PLATELETS: 420 10*3/uL — AB (ref 150–400)
RBC: 3.79 MIL/uL — AB (ref 3.87–5.11)
RDW: 16.5 % — ABNORMAL HIGH (ref 11.5–15.5)
WBC: 7.4 10*3/uL (ref 4.0–10.5)

## 2016-04-01 LAB — I-STAT BETA HCG BLOOD, ED (MC, WL, AP ONLY): I-stat hCG, quantitative: <5 m[IU]/mL (ref <5)

## 2016-04-01 LAB — URINE MICROSCOPIC-ADD ON
Bacteria, UA: NONE SEEN
WBC UA: NONE SEEN WBC/hpf (ref 0–5)

## 2016-04-01 LAB — LIPASE, BLOOD: Lipase: 23 U/L (ref 11–51)

## 2016-04-01 MED ORDER — ONDANSETRON 4 MG PO TBDP: 4.0000 mg | ORAL_TABLET | Freq: Three times a day (TID) | ORAL | Status: DC | PRN

## 2016-04-01 MED ORDER — DICYCLOMINE HCL 10 MG PO CAPS
10.0000 mg | ORAL_CAPSULE | Freq: Once | ORAL | Status: AC
  Administered 2016-04-01: 10 mg via ORAL
  Filled 2016-04-01: qty 1

## 2016-04-01 MED ORDER — GI COCKTAIL ~~LOC~~
30.0000 mL | Freq: Once | ORAL | Status: AC
  Administered 2016-04-01: 30 mL via ORAL
  Filled 2016-04-01: qty 30

## 2016-04-01 MED ORDER — ONDANSETRON 8 MG PO TBDP
8.0000 mg | ORAL_TABLET | Freq: Once | ORAL | Status: AC
  Administered 2016-04-01: 8 mg via ORAL
  Filled 2016-04-01: qty 1

## 2016-04-01 MED ORDER — MORPHINE SULFATE (PF) 4 MG/ML IV SOLN
4.0000 mg | Freq: Once | INTRAVENOUS | Status: AC
  Administered 2016-04-01: 4 mg via INTRAVENOUS
  Filled 2016-04-01: qty 1

## 2016-04-01 MED ORDER — SODIUM CHLORIDE 0.9 % IV BOLUS (SEPSIS)
1000.0000 mL | Freq: Once | INTRAVENOUS | Status: AC
  Administered 2016-04-01: 1000 mL via INTRAVENOUS

## 2016-04-01 NOTE — ED Notes (Signed)
Per EMS- Sudden onset generalized abd pain since yesterday. No masses, rigidity. One episode of emesis. On bowel movement made today.

## 2016-04-01 NOTE — ED Notes (Signed)
Bed: [id number] Expected date:  Expected time:  Means of arrival:  Comments: 40 yo F  Abd pain

## 2016-04-01 NOTE — Discharge Instructions (Signed)
Abdominal Pain, Adult °Many things can cause abdominal pain. Usually, abdominal pain is not caused by a disease and will improve without treatment. It can often be observed and treated at home. Your health care provider will do a physical exam and possibly order blood tests and X-rays to help determine the seriousness of your pain. However, in many cases, more time must pass before a clear cause of the pain can be found. Before that point, your health care provider may not know if you need more testing or further treatment. °HOME CARE INSTRUCTIONS °Monitor your abdominal pain for any changes. The following actions may help to alleviate any discomfort you are experiencing: °· Only take over-the-counter or prescription medicines as directed by your health care provider. °· Do not take laxatives unless directed to do so by your health care provider. °· Try a clear liquid diet (broth, tea, or water) as directed by your health care provider. Slowly move to a bland diet as tolerated. °SEEK MEDICAL CARE IF: °· You have unexplained abdominal pain. °· You have abdominal pain associated with nausea or diarrhea. °· You have pain when you urinate or have a bowel movement. °· You experience abdominal pain that wakes you in the night. °· You have abdominal pain that is worsened or improved by eating food. °· You have abdominal pain that is worsened with eating fatty foods. °· You have a fever. °SEEK IMMEDIATE MEDICAL CARE IF: °· Your pain does not go away within 2 hours. °· You keep throwing up (vomiting). °· Your pain is felt only in portions of the abdomen, such as the right side or the left lower portion of the abdomen. °· You pass bloody or black tarry stools. °MAKE SURE YOU: °· Understand these instructions. °· Will watch your condition. °· Will get help right away if you are not doing well or get worse. °  °This information is not intended to replace advice given to you by your health care provider. Make sure you discuss  any questions you have with your health care provider. °  °Document Released: 07/03/2005 Document Revised: 06/14/2015 Document Reviewed: 06/02/2013 °Elsevier Interactive Patient Education ©2016 [employer] ° °Nausea and Vomiting °Nausea is a sick feeling that often comes before throwing up (vomiting). Vomiting is a reflex where stomach contents come out of your mouth. Vomiting can cause severe loss of body fluids (dehydration). Children and elderly adults can become dehydrated quickly, especially if they also have diarrhea. Nausea and vomiting are symptoms of a condition or disease. It is important to find the cause of your symptoms. °CAUSES  °· Direct irritation of the stomach lining. This irritation can result from increased acid production (gastroesophageal reflux disease), infection, food poisoning, taking certain medicines (such as nonsteroidal anti-inflammatory drugs), alcohol use, or tobacco use. °· Signals from the brain. These signals could be caused by a headache, heat exposure, an inner ear disturbance, increased pressure in the brain from injury, infection, a tumor, or a concussion, pain, emotional stimulus, or metabolic problems. °· An obstruction in the gastrointestinal tract (bowel obstruction). °· Illnesses such as diabetes, hepatitis, gallbladder problems, appendicitis, kidney problems, cancer, sepsis, atypical symptoms of a heart attack, or eating disorders. °· Medical treatments such as chemotherapy and radiation. °· Receiving medicine that makes you sleep (general anesthetic) during surgery. °DIAGNOSIS °Your caregiver may ask for tests to be done if the problems do not improve after a few days. Tests may also be done if symptoms are severe or if the reason for the   nausea and vomiting is not clear. Tests may include: °· Urine tests. °· Blood tests. °· Stool tests. °· Cultures (to look for evidence of infection). °· X-rays or other imaging studies. °Test results can help your caregiver make  decisions about treatment or the need for additional tests. °TREATMENT °You need to stay well hydrated. Drink frequently but in small amounts. You may wish to drink water, sports drinks, clear broth, or eat frozen ice pops or gelatin dessert to help stay hydrated. When you eat, eating slowly may help prevent nausea. There are also some antinausea medicines that may help prevent nausea. °HOME CARE INSTRUCTIONS  °· Take all medicine as directed by your caregiver. °· If you do not have an appetite, do not force yourself to eat. However, you must continue to drink fluids. °· If you have an appetite, eat a normal diet unless your caregiver tells you differently. °¨ Eat a variety of complex carbohydrates (rice, wheat, potatoes, bread), lean meats, yogurt, fruits, and vegetables. °¨ Avoid high-fat foods because they are more difficult to digest. °· Drink enough water and fluids to keep your urine clear or pale yellow. °· If you are dehydrated, ask your caregiver for specific rehydration instructions. Signs of dehydration may include: °¨ Severe thirst. °¨ Dry lips and mouth. °¨ Dizziness. °¨ Dark urine. °¨ Decreasing urine frequency and amount. °¨ Confusion. °¨ Rapid breathing or pulse. °SEEK IMMEDIATE MEDICAL CARE IF:  °· You have blood or brown flecks (like coffee grounds) in your vomit. °· You have black or bloody stools. °· You have a severe headache or stiff neck. °· You are confused. °· You have severe abdominal pain. °· You have chest pain or trouble breathing. °· You do not urinate at least once every 8 hours. °· You develop cold or clammy skin. °· You continue to vomit for longer than 24 to 48 hours. °· You have a fever. °MAKE SURE YOU:  °· Understand these instructions. °· Will watch your condition. °· Will get help right away if you are not doing well or get worse. °  °This information is not intended to replace advice given to you by your health care provider. Make sure you discuss any questions you have with  your health care provider. °  °Document Released: 09/23/2005 Document Revised: 12/16/2011 Document Reviewed: 02/20/2011 °Elsevier Interactive Patient Education ©2016 [employer] Inc[employer] ° °

## 2016-04-01 NOTE — ED Provider Notes (Signed)
CSN: [id number]     Arrival date & time 04/01/16  0117 History   First MD Initiated Contact with Patient 04/01/16 0150     Chief Complaint  Patient presents with  . Abdominal Pain     (Consider location/radiation/quality/duration/timing/severity/associated sxs/prior Treatment) The history is provided by the patient.     The patient is a 39 year old female who presents emergency Department with complaints of 1 day of generalized abdominal pain that is severe, without radiation, without alleviating or aggravating factors. Associated with one episode of vomiting just prior to arrival, emesis is nonbloody and nonbilious. She is having normal bowel movements without diarrhea or constipation.  She denies any dysuria, hematuria, no vaginal symptoms, she is currently on her period.  She denies any suspicious food intake, no sick contacts.  No history of abdominal surgeries.  No fever, sweats, chills, cough.  No other acute complaints.  Past Medical History  Diagnosis Date  . Hypertension   . Depression   . Anxiety   . Anemia   . Elevated bilirubin     slight elevation at 1.3  . Hypokalemia   . Intracranial hypertension   . Migraines    Past Surgical History  Procedure Laterality Date  . No past surgeries     Family History  Problem Relation Age of Onset  . Anesthesia problems Neg Hx   . Malignant hyperthermia Neg Hx   . Pseudochol deficiency Neg Hx   . Hypotension Neg Hx   . Migraines Neg Hx   . Schizophrenia Mother   . Hypertension Mother    Social History  Substance Use Topics  . Smoking status: Current Every Day Smoker -- 0.50 packs/day for 4 years    Types: Cigarettes  . Smokeless tobacco: None  . Alcohol Use: No   OB History    No data available     Review of Systems  All other systems reviewed and are negative.     Allergies  Penicillins  Home Medications   Prior to Admission medications   Medication Sig Start Date End Date Taking? Authorizing Provider   acetaZOLAMIDE (DIAMOX) 500 MG capsule Take 1 capsule (500 mg total) by mouth 3 (three) times daily. 01/24/16  Yes Melvenia Beam, MD  amLODipine (NORVASC) 5 MG tablet Take 5 mg by mouth every morning. 12/30/15  Yes Historical Provider, MD  ARIPiprazole 400 MG SUSR Inject 400 mg into the muscle every 30 (thirty) days. Prior authorization completed confirmation [id number] Next dose due Sep 14 2015 08/22/15  Yes Ursula Alert, MD  aspirin 81 MG EC tablet Take 1 tablet by mouth daily. 02/15/16  Yes Historical Provider, MD  benztropine (COGENTIN) 1 MG tablet Take 1 tablet (1 mg total) by mouth at bedtime. 123456  Yes Delora Fuel, MD  hydrochlorothiazide (HYDRODIURIL) 25 MG tablet Take 1 tablet (25 mg total) by mouth daily. 08/24/15  Yes Derrill Center, NP  hydrOXYzine (ATARAX/VISTARIL) 25 MG tablet Take 1 tablet (25 mg total) by mouth 3 (three) times daily as needed for anxiety. 08/24/15  Yes Derrill Center, NP  ibuprofen (ADVIL,MOTRIN) 200 MG tablet Take 400 mg by mouth every 6 (six) hours as needed for headache.   Yes Historical Provider, MD  lamoTRIgine (LAMICTAL) 25 MG tablet Take 1 tablet (25 mg total) by mouth 2 (two) times daily. 08/24/15  Yes Derrill Center, NP  traZODone (DESYREL) 150 MG tablet Take 1 tablet (150 mg total) by mouth at bedtime. Patient taking differently: Take 150 mg by  mouth at bedtime as needed for sleep.  08/24/15  Yes Derrill Center, NP  methocarbamol (ROBAXIN) 500 MG tablet Take 1 tablet (500 mg total) by mouth every 8 (eight) hours as needed for muscle spasms. 123456   Delora Fuel, MD   BP 123456 mmHg  Pulse 65  Temp(Src) 97.7 F (36.5 C) (Oral)  Resp 20  SpO2 96%  LMP 04/01/2016 Physical Exam  Constitutional: She is oriented to person, place, and time. She appears well-developed and well-nourished. No distress.  HENT:  Head: Normocephalic and atraumatic.  Nose: Nose normal.  Mouth/Throat: Oropharynx is clear and moist. No oropharyngeal exudate.  Eyes:  Conjunctivae and EOM are normal. Pupils are equal, round, and reactive to light. Right eye exhibits no discharge. Left eye exhibits no discharge. No scleral icterus.  Neck: Normal range of motion. No JVD present. No tracheal deviation present. No thyromegaly present.  Cardiovascular: Normal rate, regular rhythm, normal heart sounds and intact distal pulses.  Exam reveals no gallop and no friction rub.   No murmur heard. Pulmonary/Chest: Effort normal and breath sounds normal. No respiratory distress. She has no wheezes. She has no rales. She exhibits no tenderness.  Abdominal: Soft. Bowel sounds are normal. She exhibits no distension and no mass. There is tenderness. There is no rebound and no guarding.  ttp in epigastrum, no guarding, no rebound, no CVA tenderness.  Musculoskeletal: Normal range of motion. She exhibits no edema or tenderness.  Lymphadenopathy:    She has no cervical adenopathy.  Neurological: She is alert and oriented to person, place, and time. She has normal reflexes. No cranial nerve deficit. She exhibits normal muscle tone. Coordination normal.  Skin: Skin is warm and dry. No rash noted. She is not diaphoretic. No erythema. No pallor.  Psychiatric: She has a normal mood and affect. Her behavior is normal. Judgment and thought content normal.  Nursing note and vitals reviewed.   ED Course  Procedures (including critical care time) Labs Review Labs Reviewed  COMPREHENSIVE METABOLIC PANEL - Abnormal; Notable for the following:    Potassium 3.3 (*)    Calcium 8.4 (*)    AST 14 (*)    ALT 9 (*)    All other components within normal limits  CBC - Abnormal; Notable for the following:    RBC 3.79 (*)    Hemoglobin 10.6 (*)    HCT 31.7 (*)    RDW 16.5 (*)    Platelets 420 (*)    All other components within normal limits  URINALYSIS, ROUTINE W REFLEX MICROSCOPIC (NOT AT [facility]) - Abnormal; Notable for the following:    APPearance TURBID (*)    Hgb urine dipstick LARGE (*)     All other components within normal limits  URINE MICROSCOPIC-ADD ON - Abnormal; Notable for the following:    Squamous Epithelial / LPF 0-5 (*)    All other components within normal limits  LIPASE, BLOOD  I-STAT BETA HCG BLOOD, ED ([facility], [facility], [facility] ONLY)    Imaging Review Dg Abd 1 View  04/01/2016  CLINICAL DATA:  40 year old female with left-sided abdominal pain, and nausea EXAM: ABDOMEN - 1 VIEW COMPARISON:  CT of the abdomen pelvis dated 08/13/2015 FINDINGS: The bowel gas pattern is normal. No radio-opaque calculi or other significant radiographic abnormality are seen. IMPRESSION: Negative. Electronically Signed   By: Anner Crete M.D.   On: 04/01/2016 03:30   US Abdomen Limited Ruq  04/01/2016  CLINICAL DATA:  Right upper quadrant pain for 1  day. Nausea and vomiting. EXAM: US ABDOMEN LIMITED - RIGHT UPPER QUADRANT COMPARISON:  None. FINDINGS: Gallbladder: No gallstones or wall thickening visualized. No sonographic Murphy sign noted by sonographer. Common bile duct: Diameter: 2.6 mm Liver: No focal lesion identified. Within normal limits in parenchymal echogenicity. IMPRESSION: Normal liver, gallbladder and bile ducts Electronically Signed   By: Andreas Newport M.D.   On: 04/01/2016 04:10   I have personally reviewed and evaluated these images and lab results as part of my medical decision-making.   EKG Interpretation None      MDM   40 year old female with 1 day of generalized abdominal pain with one episode of vomiting. Pt is afebrile, VS WNL, non-toxic appearing.  No active emesis.  On exam most tender to palpation in epigastrum, w/ mild guarding.  She denies urinary symptoms, no fever, no diarrhea or constipation.  Currently having period, UA contaminated menses, but otherwise unremarkable, no UTI.  Plain film of the abdomen was obtained and then limited right upper quadrant ultrasound obtained.  Plain films reveal a normal bowel gas pattern without radiopaque calculi. Ultrasound  reveals normal liver, gallbladder and bile ducts.  The patient continues to complain of pain 9/10, after IV narcotics and IV fluids. Her nausea did improve with Zofran ODT.  She is laying in bed, appears comfortable.  Has not had any further vomiting while in the ER.  GI cocktail added to evaluate for response.  Will give PO trial and reevaluated.  Pt's pain improved with tx.  She was able to drink several cans of Coke in the ED w/o difficulty. Pt to be d/c home with zofran.  Feel she is safe to d/c home, she has been examined and evaluated and I doubt any emergent medical condition present currently, as she is well appearing, has been able to sleep, eat and drink in the ER.  VSS.   Final diagnoses:  Generalized abdominal pain  Non-intractable vomiting with nausea, vomiting of unspecified type        Delsa Grana, PA-C 04/13/16 1544  Noemi Chapel, MD 05/03/16 [phone]

## 2016-04-03 ENCOUNTER — Ambulatory Visit: Payer: Medicaid Other | Admitting: Neurology

## 2016-04-11 ENCOUNTER — Emergency Department (HOSPITAL_COMMUNITY)
Admission: EM | Admit: 2016-04-11 | Discharge: 2016-04-11 | Disposition: A | Discharge status: Home / Self Care | Payer: Medicaid Other | Attending: Emergency Medicine | Admitting: Emergency Medicine

## 2016-04-11 ENCOUNTER — Emergency Department (HOSPITAL_COMMUNITY): Payer: Medicaid Other

## 2016-04-11 ENCOUNTER — Encounter (HOSPITAL_COMMUNITY): Payer: Self-pay | Admitting: Nurse Practitioner

## 2016-04-11 DIAGNOSIS — Z7982 Long term (current) use of aspirin: Secondary | ICD-10-CM | POA: Insufficient documentation

## 2016-04-11 DIAGNOSIS — I10 Essential (primary) hypertension: Secondary | ICD-10-CM | POA: Insufficient documentation

## 2016-04-11 DIAGNOSIS — G43909 Migraine, unspecified, not intractable, without status migrainosus: Secondary | ICD-10-CM | POA: Insufficient documentation

## 2016-04-11 DIAGNOSIS — R51 Headache: Secondary | ICD-10-CM | POA: Diagnosis present

## 2016-04-11 DIAGNOSIS — F17210 Nicotine dependence, cigarettes, uncomplicated: Secondary | ICD-10-CM | POA: Insufficient documentation

## 2016-04-11 LAB — CBC WITH DIFFERENTIAL/PLATELET
BASOS ABS: 0 10*3/uL (ref 0.0–0.1)
BASOS PCT: 0 %
EOS ABS: 0.2 10*3/uL (ref 0.0–0.7)
Eosinophils Relative: 4 %
HEMATOCRIT: 34.4 % — AB (ref 36.0–46.0)
Hemoglobin: 11.2 g/dL — ABNORMAL LOW (ref 12.0–15.0)
Lymphocytes Relative: 49 %
Lymphs Abs: 3.2 10*3/uL (ref 0.7–4.0)
MCH: 28.7 pg (ref 26.0–34.0)
MCHC: 32.6 g/dL (ref 30.0–36.0)
MCV: 88.2 fL (ref 78.0–100.0)
MONO ABS: 0.3 10*3/uL (ref 0.1–1.0)
Monocytes Relative: 4 %
NEUTROS ABS: 2.8 10*3/uL (ref 1.7–7.7)
Neutrophils Relative %: 43 %
PLATELETS: 427 10*3/uL — AB (ref 150–400)
RBC: 3.9 MIL/uL (ref 3.87–5.11)
RDW: 16.3 % — AB (ref 11.5–15.5)
WBC: 6.6 10*3/uL (ref 4.0–10.5)

## 2016-04-11 LAB — URINE MICROSCOPIC-ADD ON

## 2016-04-11 LAB — BASIC METABOLIC PANEL
ANION GAP: 6 (ref 5–15)
BUN: 10 mg/dL (ref 6–20)
CALCIUM: 8.6 mg/dL — AB (ref 8.9–10.3)
CO2: 24 mmol/L (ref 22–32)
CREATININE: 0.71 mg/dL (ref 0.44–1.00)
Chloride: 108 mmol/L (ref 101–111)
GLUCOSE: 78 mg/dL (ref 65–99)
Potassium: 3.7 mmol/L (ref 3.5–5.1)
Sodium: 138 mmol/L (ref 135–145)

## 2016-04-11 LAB — URINALYSIS, ROUTINE W REFLEX MICROSCOPIC
BILIRUBIN URINE: NEGATIVE
Glucose, UA: NEGATIVE mg/dL
KETONES UR: NEGATIVE mg/dL
NITRITE: NEGATIVE
Protein, ur: 30 mg/dL — AB
Specific Gravity, Urine: 1.02 (ref 1.005–1.030)
pH: 7 (ref 5.0–8.0)

## 2016-04-11 LAB — TROPONIN I

## 2016-04-11 LAB — PREGNANCY, URINE: PREG TEST UR: NEGATIVE

## 2016-04-11 MED ORDER — IBUPROFEN 600 MG PO TABS: 600.0000 mg | ORAL_TABLET | Freq: Four times a day (QID) | ORAL | Status: DC | PRN

## 2016-04-11 MED ORDER — METOPROLOL TARTRATE 5 MG/5ML IV SOLN
5.0000 mg | Freq: Once | INTRAVENOUS | Status: AC
  Administered 2016-04-11: 5 mg via INTRAVENOUS
  Filled 2016-04-11: qty 5

## 2016-04-11 MED ORDER — ONDANSETRON HCL 4 MG/2ML IJ SOLN
4.0000 mg | Freq: Once | INTRAMUSCULAR | Status: AC
  Administered 2016-04-11: 4 mg via INTRAVENOUS
  Filled 2016-04-11: qty 2

## 2016-04-11 MED ORDER — METOCLOPRAMIDE HCL 10 MG PO TABS: 10.0000 mg | ORAL_TABLET | Freq: Four times a day (QID) | ORAL | Status: DC

## 2016-04-11 MED ORDER — MORPHINE SULFATE (PF) 4 MG/ML IV SOLN
4.0000 mg | Freq: Once | INTRAVENOUS | Status: AC
  Administered 2016-04-11: 4 mg via INTRAVENOUS
  Filled 2016-04-11: qty 1

## 2016-04-11 NOTE — ED Notes (Addendum)
Per EMS pt from home states woke up from nap with posterior headache one hour ago. Patient has had migraine and was seen at [facility] long prior- denies any at home meds. Patient has history of HTN and notes compliance with medications. Pt endorses left sided weakness no noted weakness- equal strong hand grips, steady gait, no arm or leg drift.   Patient denies blurry vision, nausea, slurred speech, paresthesias.   MD at bedside.

## 2016-04-11 NOTE — ED Notes (Signed)
Pt is in stable condition upon d/c and ambulates from ED. 

## 2016-04-11 NOTE — Discharge Instructions (Signed)
Hypertension Hypertension, commonly called high blood pressure, is when the force of blood pumping through your arteries is too strong. Your arteries are the blood vessels that carry blood from your heart throughout your body. A blood pressure reading consists of a higher number over a lower number, such as 110/72. The higher number (systolic) is the pressure inside your arteries when your heart pumps. The lower number (diastolic) is the pressure inside your arteries when your heart relaxes. Ideally you want your blood pressure below 120/80. Hypertension forces your heart to work harder to pump blood. Your arteries may become narrow or stiff. Having untreated or uncontrolled hypertension can cause heart attack, stroke, kidney disease, and other problems. RISK FACTORS Some risk factors for high blood pressure are controllable. Others are not.  Risk factors you cannot control include:   Race. You may be at higher risk if you are African American.  Age. Risk increases with age.  Gender. Men are at higher risk than women before age 45 years. After age 65, women are at higher risk than men. Risk factors you can control include:  Not getting enough exercise or physical activity.  Being overweight.  Getting too much fat, sugar, calories, or salt in your diet.  Drinking too much alcohol. SIGNS AND SYMPTOMS Hypertension does not usually cause signs or symptoms. Extremely high blood pressure (hypertensive crisis) may cause headache, anxiety, shortness of breath, and nosebleed. DIAGNOSIS To check if you have hypertension, your health care provider will measure your blood pressure while you are seated, with your arm held at the level of your heart. It should be measured at least twice using the same arm. Certain conditions can cause a difference in blood pressure between your right and left arms. A blood pressure reading that is higher than normal on one occasion does not mean that you need treatment. If  it is not clear whether you have high blood pressure, you may be asked to return on a different day to have your blood pressure checked again. Or, you may be asked to monitor your blood pressure at home for 1 or more weeks. TREATMENT Treating high blood pressure includes making lifestyle changes and possibly taking medicine. Living a healthy lifestyle can help lower high blood pressure. You may need to change some of your habits. Lifestyle changes may include:  Following the DASH diet. This diet is high in fruits, vegetables, and whole grains. It is low in salt, red meat, and added sugars.  Keep your sodium intake below 2,300 mg per day.  Getting at least 30-45 minutes of aerobic exercise at least 4 times per week.  Losing weight if necessary.  Not smoking.  Limiting alcoholic beverages.  Learning ways to reduce stress. Your health care provider may prescribe medicine if lifestyle changes are not enough to get your blood pressure under control, and if one of the following is true:  You are 18-59 years of age and your systolic blood pressure is above 140.  You are 60 years of age or older, and your systolic blood pressure is above 150.  Your diastolic blood pressure is above 90.  You have diabetes, and your systolic blood pressure is over 140 or your diastolic blood pressure is over 90.  You have kidney disease and your blood pressure is above 140/90.  You have heart disease and your blood pressure is above 140/90. Your personal target blood pressure may vary depending on your medical conditions, your age, and other factors. HOME CARE INSTRUCTIONS    Have your blood pressure rechecked as directed by your health care provider.   Take medicines only as directed by your health care provider. Follow the directions carefully. Blood pressure medicines must be taken as prescribed. The medicine does not work as well when you skip doses. Skipping doses also puts you at risk for  problems.  Do not smoke.   Monitor your blood pressure at home as directed by your health care provider. SEEK MEDICAL CARE IF:   You think you are having a reaction to medicines taken.  You have recurrent headaches or feel dizzy.  You have swelling in your ankles.  You have trouble with your vision. SEEK IMMEDIATE MEDICAL CARE IF:  You develop a severe headache or confusion.  You have unusual weakness, numbness, or feel faint.  You have severe chest or abdominal pain.  You vomit repeatedly.  You have trouble breathing. MAKE SURE YOU:   Understand these instructions.  Will watch your condition.  Will get help right away if you are not doing well or get worse.   This information is not intended to replace advice given to you by your health care provider. Make sure you discuss any questions you have with your health care provider.   Document Released: 09/23/2005 Document Revised: 02/07/2015 Document Reviewed: 07/16/2013 Elsevier Interactive Patient Education 2016 [employer]  

## 2016-04-11 NOTE — ED Provider Notes (Signed)
CSN: [id number]     Arrival date & time 04/11/16  1600 History   First MD Initiated Contact with Patient 04/11/16 1606     Chief Complaint  Patient presents with  . Headache   Pt is a 40 yo bf with a hx of migraines.  Pt said that she woke up from a nap abot 1 hr ago with a headache.  The pt has a hx of htn and said that she has been taking her meds.  Pt feels like she has left sided weakness.  (Consider location/radiation/quality/duration/timing/severity/associated sxs/prior Treatment) Patient is a 40 y.o. female presenting with headaches. The history is provided by the patient.  Headache Pain location:  Generalized Quality:  Dull Radiates to:  Does not radiate Timing:  Intermittent Progression:  Unchanged Chronicity:  Recurrent Similar to prior headaches: yes   Associated symptoms: weakness     Past Medical History  Diagnosis Date  . Hypertension   . Depression   . Anxiety   . Anemia   . Elevated bilirubin     slight elevation at 1.3  . Hypokalemia   . Intracranial hypertension   . Migraines    Past Surgical History  Procedure Laterality Date  . No past surgeries     Family History  Problem Relation Age of Onset  . Anesthesia problems Neg Hx   . Malignant hyperthermia Neg Hx   . Pseudochol deficiency Neg Hx   . Hypotension Neg Hx   . Migraines Neg Hx   . Schizophrenia Mother   . Hypertension Mother    Social History  Substance Use Topics  . Smoking status: Current Every Day Smoker -- 0.50 packs/day for 4 years    Types: Cigarettes  . Smokeless tobacco: None  . Alcohol Use: No   OB History    No data available     Review of Systems  Neurological: Positive for weakness and headaches.  All other systems reviewed and are negative.     Allergies  Penicillins  Home Medications   Prior to Admission medications   Medication Sig Start Date End Date Taking? Authorizing Provider  acetaZOLAMIDE (DIAMOX) 500 MG capsule Take 1 capsule (500 mg total) by  mouth 3 (three) times daily. 01/24/16  Yes Melvenia Beam, MD  amLODipine (NORVASC) 5 MG tablet Take 5 mg by mouth every morning. 12/30/15  Yes Historical Provider, MD  ARIPiprazole 400 MG SUSR Inject 400 mg into the muscle every 30 (thirty) days. Prior authorization completed confirmation [id number] Next dose due Sep 14 2015 08/22/15  Yes Ursula Alert, MD  aspirin 81 MG EC tablet Take 1 tablet by mouth daily. 02/15/16  Yes Historical Provider, MD  benztropine (COGENTIN) 1 MG tablet Take 1 tablet (1 mg total) by mouth at bedtime. [id number]  Yes Delora Fuel, MD  hydrochlorothiazide (HYDRODIURIL) 25 MG tablet Take 1 tablet (25 mg total) by mouth daily. 08/24/15  Yes Derrill Center, NP  hydrOXYzine (ATARAX/VISTARIL) 25 MG tablet Take 1 tablet (25 mg total) by mouth 3 (three) times daily as needed for anxiety. 08/24/15  Yes Derrill Center, NP  lamoTRIgine (LAMICTAL) 25 MG tablet Take 1 tablet (25 mg total) by mouth 2 (two) times daily. 08/24/15  Yes Derrill Center, NP  traZODone (DESYREL) 150 MG tablet Take 1 tablet (150 mg total) by mouth at bedtime. Patient taking differently: Take 150 mg by mouth at bedtime as needed for sleep.  08/24/15  Yes Derrill Center, NP  ibuprofen (ADVIL,MOTRIN) 600 MG  tablet Take 1 tablet (600 mg total) by mouth every 6 (six) hours as needed. 04/11/16   Isla Pence, MD  methocarbamol (ROBAXIN) 500 MG tablet Take 1 tablet (500 mg total) by mouth every 8 (eight) hours as needed for muscle spasms. Patient not taking: Reported on 04/11/2016 123456   Delora Fuel, MD  metoCLOPramide (REGLAN) 10 MG tablet Take 1 tablet (10 mg total) by mouth every 6 (six) hours. 04/11/16   Isla Pence, MD  ondansetron (ZOFRAN ODT) 4 MG disintegrating tablet Take 1 tablet (4 mg total) by mouth every 8 (eight) hours as needed for nausea or vomiting. Patient not taking: Reported on 04/11/2016 04/01/16   Delsa Grana, PA-C   BP 152/92 mmHg  Pulse 44  Temp(Src) 98.7 F (37.1 C) (Oral)  Resp 19   SpO2 98%  LMP 04/01/2016 Physical Exam  Constitutional: She is oriented to person, place, and time. She appears well-developed and well-nourished.  HENT:  Head: Normocephalic and atraumatic.  Right Ear: External ear normal.  Left Ear: External ear normal.  Nose: Nose normal.  Mouth/Throat: Oropharynx is clear and moist.  Hirsutism noted  Eyes: Conjunctivae and EOM are normal. Pupils are equal, round, and reactive to light.  Neck: Normal range of motion. Neck supple.  Cardiovascular: Normal rate, regular rhythm, normal heart sounds and intact distal pulses.   Pulmonary/Chest: Effort normal and breath sounds normal.  Abdominal: Soft. Bowel sounds are normal.  Musculoskeletal: Normal range of motion.  Neurological: She is alert and oriented to person, place, and time.  Skin: Skin is warm and dry.  Psychiatric: She has a normal mood and affect. Her behavior is normal. Judgment and thought content normal.  Nursing note and vitals reviewed.   ED Course  Procedures (including critical care time) Labs Review Labs Reviewed  BASIC METABOLIC PANEL - Abnormal; Notable for the following:    Calcium 8.6 (*)    All other components within normal limits  CBC WITH DIFFERENTIAL/PLATELET - Abnormal; Notable for the following:    Hemoglobin 11.2 (*)    HCT 34.4 (*)    RDW 16.3 (*)    Platelets 427 (*)    All other components within normal limits  URINALYSIS, ROUTINE W REFLEX MICROSCOPIC (NOT AT [facility] - Abnormal; Notable for the following:    APPearance TURBID (*)    Hgb urine dipstick LARGE (*)    Protein, ur 30 (*)    Leukocytes, UA TRACE (*)    All other components within normal limits  URINE MICROSCOPIC-ADD ON - Abnormal; Notable for the following:    Squamous Epithelial / LPF 6-30 (*)    Bacteria, UA MANY (*)    All other components within normal limits  TROPONIN I  PREGNANCY, URINE  hematuria is due to pt on period.  Imaging Review Ct Head Wo Contrast  04/11/2016  CLINICAL DATA:   Posterior headache beginning this afternoon. History of hypertension and migraines. EXAM: CT HEAD WITHOUT CONTRAST TECHNIQUE: Contiguous axial images were obtained from the base of the skull through the vertex without intravenous contrast. COMPARISON:  Brain MRI 02/02/2016 and CT 01/17/2016 FINDINGS: The ventricles and sulci are within normal limits for age. There is no evidence of acute infarct, intracranial hemorrhage, mass, midline shift, or extra-axial collection. A moderately expanded partially empty sella is again seen. Subtle hypoattenuation in the left corona radiata corresponds to the T2 hyperintensity on MRI, better demonstrated on that examination and nonspecific. The orbits are unremarkable. The visualized paranasal sinuses and mastoid air cells  are clear. Cerumen or other debris is noted in the external auditory canals. No skull fracture is identified. IMPRESSION: No evidence of acute intracranial abnormality. Electronically Signed   By: Logan Bores M.D.   On: 04/11/2016 16:55   I have personally reviewed and evaluated these images and lab results as part of my medical decision-making.   EKG Interpretation   Date/Time:  Thursday April 11 2016 16:09:54 EDT Ventricular Rate:  55 PR Interval:    QRS Duration: 90 QT Interval:  397 QTC Calculation: 380 R Axis:   93 Text Interpretation:  Sinus rhythm Prolonged PR interval Borderline right  axis deviation Consider left ventricular hypertrophy Borderline T  abnormalities, lateral leads No significant change since last tracing  Confirmed by [facility] MD, Reika Callanan ([id number]) on 04/11/2016 4:20:31 PM      MDM  Pt is feeling better.  Her bp is much improved.  She is encouraged to take meds as directed and to return if worse.  F/u with pcp. Final diagnoses:  Migraine without status migrainosus, not intractable, unspecified migraine type  Essential hypertension        Isla Pence, MD 04/11/16 1825

## 2016-04-30 ENCOUNTER — Ambulatory Visit: Payer: Medicaid Other | Admitting: Neurology

## 2016-05-15 ENCOUNTER — Telehealth: Payer: Self-pay | Admitting: *Deleted

## 2016-05-15 ENCOUNTER — Ambulatory Visit: Payer: Medicaid Other | Admitting: Neurology

## 2016-05-15 NOTE — Telephone Encounter (Signed)
no showed f/u 

## 2016-05-20 ENCOUNTER — Encounter: Payer: Self-pay | Admitting: Neurology

## 2017-01-01 ENCOUNTER — Emergency Department (HOSPITAL_COMMUNITY)
Admission: EM | Admit: 2017-01-01 | Discharge: 2017-01-02 | Disposition: A | Discharge status: Home / Self Care | Payer: Medicaid Other | Attending: Emergency Medicine | Admitting: Emergency Medicine

## 2017-01-01 ENCOUNTER — Encounter (HOSPITAL_COMMUNITY): Payer: Self-pay | Admitting: *Deleted

## 2017-01-01 DIAGNOSIS — I10 Essential (primary) hypertension: Secondary | ICD-10-CM | POA: Insufficient documentation

## 2017-01-01 DIAGNOSIS — F259 Schizoaffective disorder, unspecified: Secondary | ICD-10-CM | POA: Insufficient documentation

## 2017-01-01 DIAGNOSIS — R45851 Suicidal ideations: Secondary | ICD-10-CM

## 2017-01-01 DIAGNOSIS — Z87891 Personal history of nicotine dependence: Secondary | ICD-10-CM | POA: Insufficient documentation

## 2017-01-01 DIAGNOSIS — Z79899 Other long term (current) drug therapy: Secondary | ICD-10-CM | POA: Insufficient documentation

## 2017-01-01 LAB — ETHANOL

## 2017-01-01 LAB — COMPREHENSIVE METABOLIC PANEL
ALBUMIN: 3.6 g/dL (ref 3.5–5.0)
ALK PHOS: 82 U/L (ref 38–126)
ALT: 10 U/L — ABNORMAL LOW (ref 14–54)
ANION GAP: 6 (ref 5–15)
AST: 21 U/L (ref 15–41)
BILIRUBIN TOTAL: 0.8 mg/dL (ref 0.3–1.2)
BUN: 16 mg/dL (ref 6–20)
CALCIUM: 8.8 mg/dL — AB (ref 8.9–10.3)
CO2: 24 mmol/L (ref 22–32)
Chloride: 111 mmol/L (ref 101–111)
Creatinine, Ser: 0.9 mg/dL (ref 0.44–1.00)
GLUCOSE: 114 mg/dL — AB (ref 65–99)
Potassium: 3.6 mmol/L (ref 3.5–5.1)
Sodium: 141 mmol/L (ref 135–145)
TOTAL PROTEIN: 7.4 g/dL (ref 6.5–8.1)

## 2017-01-01 LAB — ACETAMINOPHEN LEVEL

## 2017-01-01 LAB — CBC
HEMATOCRIT: 30.8 % — AB (ref 36.0–46.0)
Hemoglobin: 10.1 g/dL — ABNORMAL LOW (ref 12.0–15.0)
MCH: 28.5 pg (ref 26.0–34.0)
MCHC: 32.8 g/dL (ref 30.0–36.0)
MCV: 86.8 fL (ref 78.0–100.0)
Platelets: 475 10*3/uL — ABNORMAL HIGH (ref 150–400)
RBC: 3.55 MIL/uL — ABNORMAL LOW (ref 3.87–5.11)
RDW: 15.1 % (ref 11.5–15.5)
WBC: 8.5 10*3/uL (ref 4.0–10.5)

## 2017-01-01 LAB — RAPID URINE DRUG SCREEN, HOSP PERFORMED
Amphetamines: NOT DETECTED
BARBITURATES: NOT DETECTED
Benzodiazepines: NOT DETECTED
COCAINE: NOT DETECTED
Opiates: NOT DETECTED
Tetrahydrocannabinol: POSITIVE — AB

## 2017-01-01 LAB — SALICYLATE LEVEL: Salicylate Lvl: <7 mg/dL (ref 2.8–30.0)

## 2017-01-01 MED ORDER — LORAZEPAM 1 MG PO TABS: 1.0000 mg | ORAL_TABLET | Freq: Three times a day (TID) | ORAL | Status: DC | PRN

## 2017-01-01 MED ORDER — ONDANSETRON HCL 4 MG PO TABS: 4.0000 mg | ORAL_TABLET | Freq: Three times a day (TID) | ORAL | Status: DC | PRN

## 2017-01-01 MED ORDER — ACETAMINOPHEN 325 MG PO TABS: 650.0000 mg | ORAL_TABLET | ORAL | Status: DC | PRN

## 2017-01-01 MED ORDER — IBUPROFEN 200 MG PO TABS: 600.0000 mg | ORAL_TABLET | Freq: Three times a day (TID) | ORAL | Status: DC | PRN

## 2017-01-01 NOTE — ED Notes (Signed)
Patient changed into purple scrubs/yellow socks. Patient wanded by security.

## 2017-01-01 NOTE — ED Notes (Signed)
Patient reports SI with plan to over dose and command AH telling her to kill herself. Patient denies HI and VH. Plan of care discussed. Encouragement and support provided and safety maintain. Q 15 min safety checks in place and video monitoring.

## 2017-01-01 NOTE — Progress Notes (Signed)
Per Frederico Hamman, [place] meets inpatient criteria Abygale Karpf K. Nash Shearer, LPC-A, [facility]  Counselor 01/01/2017 8:19 PM

## 2017-01-01 NOTE — ED Notes (Signed)
Bed: [facility] Expected date:  Expected time:  Means of arrival:  Comments: Triage 4   
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
## 2017-01-01 NOTE — ED Notes (Signed)
TTS at bedside. 
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
## 2017-01-01 NOTE — ED Triage Notes (Signed)
Pt reports increased depression and SI for the past 3 days. Pt states she began having SI after separating with her husband 3 days ago. Pt reports auditory hallucinations, states the voices tell her to harm herself.

## 2017-01-01 NOTE — ED Provider Notes (Signed)
[facility] DEPT Provider Note   CSN: [id number] Arrival date & time: 01/01/17  1620     History   Chief Complaint Chief Complaint  Patient presents with  . Suicidal    HPI Sarah [name] is a 41 y.o. female.  HPI 41 year old female who presents with suicidal thoughts. She has a history of depression and schizoaffective disorder. Has been feeling more depressed over the past few days she said after a verbal altercation with her husband that led to separation. States that 3 days ago she did take 10 tablets of Tylenol PM, 20 tablets of Advil, and 10 tablets of ibuprofen. States that she immediately vomited this up. Has not had recurrent nausea, vomiting, abdominal pain, melena or hematochezia, fevers, or confusion. Still feel suicidal, but currently denies having any other plan. Denies alcohol or illicit drug use. States that she has been hearing voices that are telling her to hurt herself. Denies any homicidal thoughts. Past Medical History:  Diagnosis Date  . Anemia   . Anxiety   . Depression   . Elevated bilirubin    slight elevation at 1.3  . Hypertension   . Hypokalemia   . Intracranial hypertension   . Migraines     Patient Active Problem List   Diagnosis Date Noted  . IIH (idiopathic intracranial hypertension) 01/24/2016  . Intellectual disability 08/18/2015  . Cannabis use disorder, moderate, dependence ([place]) 08/18/2015  . Schizoaffective disorder, bipolar type ([place]) 08/17/2015    Past Surgical History:  Procedure Laterality Date  . NO PAST SURGERIES      OB History    No data available       Home Medications    Prior to Admission medications   Medication Sig Start Date End Date Taking? Authorizing Provider  acetaZOLAMIDE (DIAMOX) 500 MG capsule Take 1 capsule (500 mg total) by mouth 3 (three) times daily. 01/24/16  Yes Melvenia Beam, MD  amLODipine (NORVASC) 5 MG tablet Take 5 mg by mouth every morning. 12/30/15  Yes Historical Provider, MD    ARIPiprazole 400 MG SUSR Inject 400 mg into the muscle every 30 (thirty) days. Prior authorization completed confirmation [id number] Next dose due Sep 14 2015 08/22/15  Yes Saramma Eappen, MD  benztropine (COGENTIN) 1 MG tablet Take 1 tablet (1 mg total) by mouth at bedtime. 93/81/82  Yes Delora Fuel, MD  hydrOXYzine (ATARAX/VISTARIL) 25 MG tablet Take 1 tablet (25 mg total) by mouth 3 (three) times daily as needed for anxiety. 08/24/15  Yes Derrill Center, NP  lamoTRIgine (LAMICTAL) 25 MG tablet Take 1 tablet (25 mg total) by mouth 2 (two) times daily. 08/24/15  Yes Derrill Center, NP  traZODone (DESYREL) 150 MG tablet Take 1 tablet (150 mg total) by mouth at bedtime. Patient taking differently: Take 150 mg by mouth at bedtime as needed for sleep.  08/24/15  Yes Derrill Center, NP  hydrochlorothiazide (HYDRODIURIL) 25 MG tablet Take 1 tablet (25 mg total) by mouth daily. Patient not taking: Reported on 01/01/2017 08/24/15   Derrill Center, NP  ibuprofen (ADVIL,MOTRIN) 600 MG tablet Take 1 tablet (600 mg total) by mouth every 6 (six) hours as needed. Patient not taking: Reported on 01/01/2017 04/11/16   Isla Pence, MD  methocarbamol (ROBAXIN) 500 MG tablet Take 1 tablet (500 mg total) by mouth every 8 (eight) hours as needed for muscle spasms. Patient not taking: Reported on 04/11/2016 99/37/16   Delora Fuel, MD  metoCLOPramide (REGLAN) 10 MG tablet Take 1 tablet (  10 mg total) by mouth every 6 (six) hours. Patient not taking: Reported on 01/01/2017 04/11/16   Isla Pence, MD  ondansetron (ZOFRAN ODT) 4 MG disintegrating tablet Take 1 tablet (4 mg total) by mouth every 8 (eight) hours as needed for nausea or vomiting. Patient not taking: Reported on 04/11/2016 04/01/16   Delsa Grana, PA-C    Family History Family History  Problem Relation Age of Onset  . Schizophrenia Mother   . Hypertension Mother   . Anesthesia problems Neg Hx   . Malignant hyperthermia Neg Hx   . Pseudochol deficiency Neg  Hx   . Hypotension Neg Hx   . Migraines Neg Hx     Social History Social History  Substance Use Topics  . Smoking status: Current Every Day Smoker    Packs/day: 0.50    Years: 4.00    Types: Cigarettes  . Smokeless tobacco: Never Used  . Alcohol use No     Allergies   Penicillins   Review of Systems Review of Systems 10/14 systems reviewed and are negative other than those stated in the HPI   Physical Exam Updated Vital Signs BP 132/67 (BP Location: Left Arm)   Pulse 70   Temp 97.7 F (36.5 C) (Oral)   Resp 16   LMP 12/04/2016   SpO2 99%   Physical Exam Physical Exam  Nursing note and vitals reviewed. Constitutional: Well developed, well nourished, non-toxic, and in no acute distress Head: Normocephalic and atraumatic.  Mouth/Throat: Oropharynx is clear and moist.  Neck: Normal range of motion. Neck supple.  Cardiovascular: Normal rate and regular rhythm.   Pulmonary/Chest: Effort normal and breath sounds normal.  Abdominal: Soft. There is no tenderness. There is no rebound and no guarding.  Musculoskeletal: Normal range of motion.  Neurological: Alert, no facial droop, fluent speech, moves all extremities symmetrically Skin: Skin is warm and dry.  Psychiatric: Cooperative   ED Treatments / Results  Labs (all labs ordered are listed, but only abnormal results are displayed) Labs Reviewed  COMPREHENSIVE METABOLIC PANEL - Abnormal; Notable for the following:       Result Value   Glucose, Bld 114 (*)    Calcium 8.8 (*)    ALT 10 (*)    All other components within normal limits  ACETAMINOPHEN LEVEL - Abnormal; Notable for the following:    Acetaminophen (Tylenol), Serum <10 (*)    All other components within normal limits  CBC - Abnormal; Notable for the following:    RBC 3.55 (*)    Hemoglobin 10.1 (*)    HCT 30.8 (*)    Platelets 475 (*)    All other components within normal limits  RAPID URINE DRUG SCREEN, HOSP PERFORMED - Abnormal; Notable for  the following:    Tetrahydrocannabinol POSITIVE (*)    All other components within normal limits  ETHANOL  SALICYLATE LEVEL    EKG  EKG Interpretation None       Radiology No results found.  Procedures Procedures (including critical care time)  Medications Ordered in ED Medications  acetaminophen (TYLENOL) tablet 650 mg (not administered)  ibuprofen (ADVIL,MOTRIN) tablet 600 mg (not administered)  LORazepam (ATIVAN) tablet 1 mg (not administered)  ondansetron (ZOFRAN) tablet 4 mg (not administered)     Initial Impression / Assessment and Plan / ED Course  I have reviewed the triage vital signs and the nursing notes.  Pertinent labs & imaging results that were available during my care of the patient were reviewed by me  and considered in my medical decision making (see chart for details).     Normal vitals, benign abdomen, and normal LFTs. Is asymptomatic. No signs or symptoms of tylenol toxicity, now three days later. Blood work reassuring. Medically cleared for TTS consult.Recommendations pending.    Final Clinical Impressions(s) / ED Diagnoses   Final diagnoses:  Suicidal ideation    New Prescriptions New Prescriptions   No medications on file     Forde Dandy, MD 01/01/17 2337

## 2017-01-01 NOTE — ED Notes (Signed)
and and none found. Patient educated about search process and term "contraband " and routine search performed. No contraband found.

## 2017-01-01 NOTE — ED Notes (Signed)
Report given to Timmothy Sours, RN in [employer]

## 2017-01-02 ENCOUNTER — Encounter (HOSPITAL_COMMUNITY): Payer: Self-pay | Admitting: *Deleted

## 2017-01-02 ENCOUNTER — Inpatient Hospital Stay (HOSPITAL_COMMUNITY)
Admission: EM | Admit: 2017-01-02 | Discharge: 2017-01-06 | DRG: 885 | Disposition: A | Discharge status: Home / Self Care | Payer: Medicaid Other | Source: Intra-hospital | Attending: Psychiatry | Admitting: Psychiatry

## 2017-01-02 DIAGNOSIS — F70 Mild intellectual disabilities: Secondary | ICD-10-CM | POA: Diagnosis not present

## 2017-01-02 DIAGNOSIS — Z915 Personal history of self-harm: Secondary | ICD-10-CM

## 2017-01-02 DIAGNOSIS — F332 Major depressive disorder, recurrent severe without psychotic features: Secondary | ICD-10-CM | POA: Diagnosis present

## 2017-01-02 DIAGNOSIS — F79 Unspecified intellectual disabilities: Secondary | ICD-10-CM

## 2017-01-02 DIAGNOSIS — Z8249 Family history of ischemic heart disease and other diseases of the circulatory system: Secondary | ICD-10-CM | POA: Diagnosis not present

## 2017-01-02 DIAGNOSIS — Z818 Family history of other mental and behavioral disorders: Secondary | ICD-10-CM

## 2017-01-02 DIAGNOSIS — F251 Schizoaffective disorder, depressive type: Secondary | ICD-10-CM | POA: Diagnosis not present

## 2017-01-02 DIAGNOSIS — Z79899 Other long term (current) drug therapy: Secondary | ICD-10-CM

## 2017-01-02 DIAGNOSIS — I10 Essential (primary) hypertension: Secondary | ICD-10-CM | POA: Diagnosis present

## 2017-01-02 DIAGNOSIS — Z814 Family history of other substance abuse and dependence: Secondary | ICD-10-CM | POA: Diagnosis not present

## 2017-01-02 DIAGNOSIS — Z630 Problems in relationship with spouse or partner: Secondary | ICD-10-CM | POA: Diagnosis not present

## 2017-01-02 DIAGNOSIS — Z880 Allergy status to penicillin: Secondary | ICD-10-CM | POA: Diagnosis not present

## 2017-01-02 DIAGNOSIS — R45851 Suicidal ideations: Secondary | ICD-10-CM | POA: Diagnosis present

## 2017-01-02 DIAGNOSIS — F250 Schizoaffective disorder, bipolar type: Principal | ICD-10-CM | POA: Diagnosis present

## 2017-01-02 DIAGNOSIS — Z87891 Personal history of nicotine dependence: Secondary | ICD-10-CM

## 2017-01-02 MED ORDER — MAGNESIUM HYDROXIDE 400 MG/5ML PO SUSP
30.0000 mL | Freq: Every day | ORAL | Status: DC | PRN
  Administered 2017-01-03 – 2017-01-04 (×2): 30 mL via ORAL
  Filled 2017-01-02 (×2): qty 30

## 2017-01-02 MED ORDER — NICOTINE 21 MG/24HR TD PT24
21.0000 mg | MEDICATED_PATCH | Freq: Every day | TRANSDERMAL | Status: DC
  Administered 2017-01-02 – 2017-01-05 (×4): 21 mg via TRANSDERMAL
  Filled 2017-01-02 (×8): qty 1

## 2017-01-02 MED ORDER — TRAZODONE HCL 50 MG PO TABS
50.0000 mg | ORAL_TABLET | Freq: Every evening | ORAL | Status: DC | PRN
  Filled 2017-01-02 (×11): qty 1
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
## 2017-01-02 NOTE — Progress Notes (Signed)
Patient has been accepted to [facility].  Patient assigned to room 404-1 Accepting physician is Dr. Parke Poisson.  Representative was Genia Hotter [facility] RN Can come after 8 a.m. Today Genaro Bekker K. Nash Shearer, LPC-A, [facility]  Counselor 01/02/2017 5:48 AM

## 2017-01-02 NOTE — BHH Suicide Risk Assessment (Signed)
[facility] INPATIENT:  Family/Significant Other Suicide Prevention Education  Suicide Prevention Education:  Patient Refusal for Family/Significant Other Suicide Prevention Education: The patient Sarah [name] has refused to provide written consent for family/significant other to be provided Family/Significant Other Suicide Prevention Education during admission and/or prior to discharge.  Physician notified.  Radonna Ricker 01/02/2017, 2:58 PM

## 2017-01-02 NOTE — Tx Team (Signed)
Initial Treatment Plan 01/02/2017 1:24 PM [name] HOZ:[id number]    PATIENT STRESSORS: Financial difficulties Marital or family conflict Medication change or noncompliance Substance abuse   PATIENT STRENGTHS: Curator fund of knowledge Motivation for treatment/growth Supportive family/friends   PATIENT IDENTIFIED PROBLEMS: "anxiety"  "depression"  "panic attacks"  "substance abuse"  "suicidal thoughts"             DISCHARGE CRITERIA:  Ability to meet basic life and health needs Adequate post-discharge living arrangements Improved stabilization in mood, thinking, and/or behavior Medical problems require only outpatient monitoring Motivation to continue treatment in a less acute level of care Need for constant or close observation no longer present Reduction of life-threatening or endangering symptoms to within safe limits Safe-care adequate arrangements made Verbal commitment to aftercare and medication compliance  PRELIMINARY DISCHARGE PLAN: Attend aftercare/continuing care group Attend PHP/IOP Outpatient therapy Participate in family therapy Return to previous living arrangement  PATIENT/FAMILY INVOLVEMENT: This treatment plan has been presented to and reviewed with the patient, Sarah [name].  The patient and family have been given the opportunity to ask questions and make suggestions.  Grayland Ormond Gordon, RN 01/02/2017, 1:24 PM
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
## 2017-01-02 NOTE — Tx Team (Signed)
Interdisciplinary Treatment and Diagnostic Plan Update  01/02/2017 Time of Session: 4:03 PM  Sarah [name] MRN: [id number]  Principal Diagnosis: Schizoaffective disorder Secondary Diagnoses: Active Problems:   MDD (major depressive disorder), recurrent severe, without psychosis ([place])   Current Medications:  Current Facility-Administered Medications  Medication Dose Route Frequency Provider Last Rate Last Dose  . magnesium hydroxide (MILK OF MAGNESIA) suspension 30 mL  30 mL Oral Daily PRN Benjamine Mola, FNP      . nicotine (NICODERM CQ - dosed in mg/24 hours) patch 21 mg  21 mg Transdermal Daily Jenne Campus, MD   21 mg at 01/02/17 1421    PTA Medications: Prescriptions Prior to Admission  Medication Sig Dispense Refill Last Dose  . acetaZOLAMIDE (DIAMOX) 500 MG capsule Take 1 capsule (500 mg total) by mouth 3 (three) times daily. 90 capsule 12 12/31/2016 at Unknown time  . amLODipine (NORVASC) 5 MG tablet Take 5 mg by mouth every morning.   01/01/2017 at Unknown time  . ARIPiprazole 400 MG SUSR Inject 400 mg into the muscle every 30 (thirty) days. Prior authorization completed confirmation [id number] Next dose due Sep 14 2015 1 each  Past Week at Unknown time  . benztropine (COGENTIN) 1 MG tablet Take 1 tablet (1 mg total) by mouth at bedtime. 30 tablet 0 Past Week at Unknown time  . hydrochlorothiazide (HYDRODIURIL) 25 MG tablet Take 1 tablet (25 mg total) by mouth daily. (Patient not taking: Reported on 01/01/2017) 30 tablet 0 Completed Course at Unknown time  . hydrOXYzine (ATARAX/VISTARIL) 25 MG tablet Take 1 tablet (25 mg total) by mouth 3 (three) times daily as needed for anxiety. 30 tablet 0 12/31/2016 at Unknown time  . ibuprofen (ADVIL,MOTRIN) 600 MG tablet Take 1 tablet (600 mg total) by mouth every 6 (six) hours as needed. (Patient not taking: Reported on 01/01/2017) 30 tablet 0 Completed Course at Unknown time  . lamoTRIgine (LAMICTAL) 25 MG tablet Take 1 tablet (25 mg  total) by mouth 2 (two) times daily. 30 tablet 0 12/31/2016 at 1900  . methocarbamol (ROBAXIN) 500 MG tablet Take 1 tablet (500 mg total) by mouth every 8 (eight) hours as needed for muscle spasms. (Patient not taking: Reported on 04/11/2016) 40 tablet 0 Not Taking at Unknown time  . metoCLOPramide (REGLAN) 10 MG tablet Take 1 tablet (10 mg total) by mouth every 6 (six) hours. (Patient not taking: Reported on 01/01/2017) 30 tablet 0 Completed Course at Unknown time  . ondansetron (ZOFRAN ODT) 4 MG disintegrating tablet Take 1 tablet (4 mg total) by mouth every 8 (eight) hours as needed for nausea or vomiting. (Patient not taking: Reported on 04/11/2016) 10 tablet 0 Not Taking at Unknown time  . traZODone (DESYREL) 150 MG tablet Take 1 tablet (150 mg total) by mouth at bedtime. (Patient taking differently: Take 150 mg by mouth at bedtime as needed for sleep. ) 30 tablet 0 Past Week at Unknown time    Treatment Modalities: Medication Management, Group therapy, Case management,  1 to 1 session with clinician, Psychoeducation, Recreational therapy.   Physician Treatment Plan for Primary Diagnosis: Schizoaffective disorder Long Term Goal(s): Improvement in symptoms so as ready for discharge  Short Term Goals: Ability to disclose and discuss suicidal ideas and Ability to identify and develop effective coping behaviors will improve  Medication Management: Evaluate patient's response, side effects, and tolerance of medication regimen.  Therapeutic Interventions: 1 to 1 sessions, Unit Group sessions and Medication administration.  Evaluation of Outcomes: Not  Met  Physician Treatment Plan for Secondary Diagnosis: Active Problems:   MDD (major depressive disorder), recurrent severe, without psychosis ([place])   Long Term Goal(s): Improvement in symptoms so as ready for discharge  Short Term Goals: Compliance with prescribed medications will improve  Medication Management: Evaluate patient's response, side  effects, and tolerance of medication regimen.  Therapeutic Interventions: 1 to 1 sessions, Unit Group sessions and Medication administration.  Evaluation of Outcomes: Not Met   RN Treatment Plan for Primary Diagnosis:Schizoaffective disorder Long Term Goal(s): Knowledge of disease and therapeutic regimen to maintain health will improve  Short Term Goals: Ability to disclose and discuss suicidal ideas and Compliance with prescribed medications will improve  Medication Management: RN will administer medications as ordered by provider, will assess and evaluate patient's response and provide education to patient for prescribed medication. RN will report any adverse and/or side effects to prescribing provider.  Therapeutic Interventions: 1 on 1 counseling sessions, Psychoeducation, Medication administration, Evaluate responses to treatment, Monitor vital signs and CBGs as ordered, Perform/monitor CIWA, COWS, AIMS and Fall Risk screenings as ordered, Perform wound care treatments as ordered.  Evaluation of Outcomes: Not Met   LCSW Treatment Plan for Primary Diagnosis:  Schizoaffective disorder Long Term Goal(s): Safe transition to appropriate next level of care at discharge, Engage patient in therapeutic group addressing interpersonal concerns.  Short Term Goals: Engage patient in aftercare planning with referrals and resources and Increase skills for wellness and recovery  Therapeutic Interventions: Assess for all discharge needs, 1 to 1 time with Social worker, Explore available resources and support systems, Assess for adequacy in community support network, Educate family and significant other(s) on suicide prevention, Complete Psychosocial Assessment, Interpersonal group therapy.  Evaluation of Outcomes: Not Met   Progress in Treatment: Attending groups: Yes Participating in groups: Yes Taking medication as prescribed: Yes, MD continues to assess for medication changes as  needed Toleration medication: Yes, no side effects reported at this time Family/Significant other contact made: No, patient declined  Patient understands diagnosis: Limited insight  Discussing patient identified problems/goals with staff: Yes Medical problems stabilized or resolved: Yes Denies suicidal/homicidal ideation: Yes Issues/concerns per patient self-inventory: None Other: N/A  New problem(s) identified: None identified at this time.   New Short Term/Long Term Goal(s): None identified at this time.   Discharge Plan or Barriers: Return home and follow up with [facility].   Reason for Continuation of Hospitalization: Anxiety  Depression Hallucinations Medication stabilization Suicidal ideation   Estimated Length of Stay: 3-5 days  Attendees: Patient: 01/02/2017  4:03 PM  Physician: Dr. Shea Evans 01/02/2017  4:03 PM  Nursing: Grayland Ormond, RN  01/02/2017  4:03 PM  RN Care Manager: Lars Pinks 01/02/2017  4:03 PM  Social Worker: Ripley Fraise, [place] 01/02/2017  4:03 PM  Recreational Therapist: Winfield Cunas 01/02/2017  4:03 PM  Other: Radonna Ricker, Social Work Intern  01/02/2017  4:03 PM  Other:  01/02/2017  4:03 PM  Other: 01/02/2017  4:03 PM    Scribe for Treatment Team: Radonna Ricker, Social Work Intern 01/02/2017 4:03 PM

## 2017-01-02 NOTE — BHH Suicide Risk Assessment (Signed)
[place] INPATIENT:  Family/Significant Other Suicide Prevention Education  Suicide Prevention Education:  Patient Refusal for Family/Significant Other Suicide Prevention Education: The patient Sarah [name] has refused to provide written consent for family/significant other to be provided Family/Significant Other Suicide Prevention Education during admission and/or prior to discharge.  Physician notified.  Berlin Hun Grossman-Orr 01/02/2017, 7:52 PM

## 2017-01-02 NOTE — BH Assessment (Signed)
[facility] Assessment Progress Note  Per Patriciaann Clan, PA, this pt requires psychiatric hospitalization at this time.  Pt has been assigned to [facility] Rm 404-1.  Pt has signed Voluntary Admission and Consent for Treatment, as well as Consent to Release Information to no one, and signed forms have been faxed to [facility].  Pt's nurse, Nena Jordan, has been notified, and agrees to send original paperwork along with pt via [facility], and to call report to [phone].  Jalene Mullet, [place] Triage Specialist [phone]

## 2017-01-02 NOTE — ED Notes (Signed)
Pt discharged safely with [place] driver.  All belongings were sent with patient.
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
## 2017-01-03 DIAGNOSIS — Z79899 Other long term (current) drug therapy: Secondary | ICD-10-CM

## 2017-01-03 DIAGNOSIS — F251 Schizoaffective disorder, depressive type: Secondary | ICD-10-CM

## 2017-01-03 DIAGNOSIS — Z818 Family history of other mental and behavioral disorders: Secondary | ICD-10-CM

## 2017-01-03 LAB — PREGNANCY, URINE: PREG TEST UR: NEGATIVE

## 2017-01-03 MED ORDER — ARIPIPRAZOLE 2 MG PO TABS
2.0000 mg | ORAL_TABLET | Freq: Every day | ORAL | Status: DC
  Administered 2017-01-03 – 2017-01-06 (×4): 2 mg via ORAL
  Filled 2017-01-03 (×6): qty 1

## 2017-01-03 MED ORDER — HYDROXYZINE HCL 25 MG PO TABS
25.0000 mg | ORAL_TABLET | Freq: Four times a day (QID) | ORAL | Status: DC | PRN
  Filled 2017-01-03: qty 1

## 2017-01-03 MED ORDER — SERTRALINE HCL 25 MG PO TABS
25.0000 mg | ORAL_TABLET | Freq: Every day | ORAL | Status: DC
  Administered 2017-01-03 – 2017-01-06 (×4): 25 mg via ORAL
  Filled 2017-01-03 (×6): qty 1

## 2017-01-03 NOTE — Progress Notes (Signed)
Recreation Therapy Notes  Date: 01/03/17 Time: 1000 Location: 500 Hall Dayroom  Group Topic: Leisure Education  Goal Area(s) Addresses:  Patient will identify positive leisure activities.  Patient will identify one positive benefit of participation in leisure activities.   Behavioral Response: Engaged  Intervention: Leisure activities, can, dry erase marker, dry erase board, eraser  Activity: Leisure [place].  One patient would come up and draw a slip from the can containing a leisure activity on it.  The patient is to then draw the activity on the board.  The remaining patients are to try and guess the picture.  The person that guesses the picture will get the next turn.  Education:  Leisure Education, Dentist  Education Outcome: Acknowledges education/In group clarification offered/Needs additional education  Clinical Observations/Feedback: Pt was quiet but engaged in the activity.  Pt seemed bright throughout group.   Victorino Sparrow, LRT/CTRS    Victorino Sparrow A 01/03/2017 12:37 PM

## 2017-01-03 NOTE — BHH Suicide Risk Assessment (Signed)
[facility] Admission Suicide Risk Assessment   Nursing information obtained from:   patient and chart  Demographic factors:   41 year old married female  Current Mental Status:   see below Loss Factors:   marital difficulties  Historical Factors:   history of prior psychiatric admissions, prior diagnosis of Schizoaffective Disorder Risk Reduction Factors:   resilience   Total Time spent with patient: 45 minutes Principal Problem:  Schizoaffective Disorder, Depressed  Diagnosis:   Patient Active Problem List   Diagnosis Date Noted  . MDD (major depressive disorder), recurrent severe, without psychosis ([place]) [F33.2] 01/02/2017  . IIH (idiopathic intracranial hypertension) [G93.2] 01/24/2016  . Intellectual disability [F79] 08/18/2015  . Cannabis use disorder, moderate, dependence ([place]) [F12.20] 08/18/2015  . Schizoaffective disorder, bipolar type ([place]) [F25.0] 08/17/2015    Continued Clinical Symptoms:  Alcohol Use Disorder Identification Test Final Score (AUDIT): 0 The "Alcohol Use Disorders Identification Test", Guidelines for Use in Primary Care, Second Edition.  World Pharmacologist [facility]). Score between 0-7:  no or low risk or alcohol related problems. Score between 8-15:  moderate risk of alcohol related problems. Score between 16-19:  high risk of alcohol related problems. Score 20 or above:  warrants further diagnostic evaluation for alcohol dependence and treatment.   CLINICAL FACTORS:  41 year married female, presented to ED due to depression, suicidal ideations, reported having overdosed on OTCs  About three days prior, and auditory hallucinations. History of Schizoaffective Disorder, reports recent decompensation was triggered by marital difficulties . At this time feeling better .    Psychiatric Specialty Exam: Physical Exam  ROS  Blood pressure 140/74, pulse 77, temperature 98.8 F (37.1 C), temperature source Oral, resp. rate 18, height 5' 6.5" (1.689 m), weight  110.2 kg (243 lb), last menstrual period 11/21/2016, SpO2 100 %.Body mass index is 38.63 kg/m.   see admit note MSE     COGNITIVE FEATURES THAT CONTRIBUTE TO RISK:  Closed-mindedness and Loss of executive function    SUICIDE RISK:   Moderate:  Frequent suicidal ideation with limited intensity, and duration, some specificity in terms of plans, no associated intent, good self-control, limited dysphoria/symptomatology, some risk factors present, and identifiable protective factors, including available and accessible social support.  PLAN OF CARE: Patient will be admitted to inpatient psychiatric unit for stabilization and safety. Will provide and encourage milieu participation. Provide medication management and maked adjustments as needed.  Will follow daily.    I certify that inpatient services furnished can reasonably be expected to improve the patient's condition.   Jenne Campus, MD 01/03/2017, 1:04 PM

## 2017-01-03 NOTE — Progress Notes (Signed)
Adult Psychoeducational Group Note  Date:  01/03/2017 Time:  10:01 PM  Group Topic/Focus:  Wrap-Up Group:   The focus of this group is to help patients review their daily goal of treatment and discuss progress on daily workbooks.  Participation Level:  Active  Participation Quality:  Appropriate  Affect:  Appropriate  Cognitive:  Appropriate  Insight: Appropriate  Engagement in Group:  Engaged  Modes of Intervention:  Discussion  Additional Comments: The patient expressed that she rates today a 8.The patient also said that she attended support group.   Sarah [name] 01/03/2017, 10:01 PM

## 2017-01-03 NOTE — Progress Notes (Signed)
D: Pt at the time of assessment was flat and withdrawn to his room. Pt at the time denied depression, anxiety, pain, SI, HI or AVH; she states, "I felt better when I got here; it was a Reader rough earlier but I feel just fine now." Pt remained calm and cooperative. A: Medications offered as prescribed. All patient's questions and concerns addressed. Support, encouragement, and safe environment provided. 15-minute safety checks continue.  R: Pt was med compliant. Pt attended [place] group. Safety checks continue.

## 2017-01-03 NOTE — H&P (Signed)
Psychiatric Admission Assessment Adult  Patient Identification: Sarah [name] MRN:  [id number] Date of Evaluation:  01/03/2017 Chief Complaint:  " I have been very depressed " Principal Diagnosis: Schizoaffective Disorder- currently depressed  Diagnosis:   Patient Active Problem List   Diagnosis Date Noted  . MDD (major depressive disorder), recurrent severe, without psychosis ([place]) [F33.2] 01/02/2017  . IIH (idiopathic intracranial hypertension) [G93.2] 01/24/2016  . Intellectual disability [F79] 08/18/2015  . Cannabis use disorder, moderate, dependence ([place]) [F12.20] 08/18/2015  . Schizoaffective disorder, bipolar type ([place]) [F25.0] 08/17/2015   History of Present Illness: 41 year old female, reports recent worsening depression, which she attributes to marital difficulties . " We had an argument , we fell out ". States she developed suicidal ideations, with thoughts of overdosing on medications. She states that three days prior to admission she had overdosed on about 10 tablets each of acetaminophen, Ibuprofen, but at the time had not told anyone and did not seek treatment. She also reports hallucinations , telling her to harm herself. She states she came to the hospital voluntarily. Of note, minimizes significant neuro-vegetative symptoms of depression, and states sleep,energy level, and appetite have been "OK".  States that at this time she is feeling better.  Associated Signs/Symptoms: Depression Symptoms:  depressed mood, suicidal thoughts with specific plan, suicidal attempt, (Hypo) Manic Symptoms:  Denies and does not currently present with manic symptoms  Anxiety Symptoms:  Denies  Psychotic Symptoms: states she had been experiencing auditory hallucinations, telling her to harm self, over the last 3 days, but states they are now resolved . PTSD Symptoms: Denies  Total Time spent with patient: 45 minutes  Past Psychiatric History: several prior psychiatric admissions , last  time 2016. She has been diagnosed with Bipolar Disorder and Schizoaffective Disorder in the past .  At the time was admitted for disorganized behaviors, was diagnosed with Schizoaffective Disorder, and was discharged on  Long actin IM Abilify, Lamictal, Trazodone.  She has history of suicidal attempt by overdosing in 2016. Denies history of self cutting, denies history of violence. States she has history of hallucinations, which occur only when feeling depressed .  Is the patient at risk to self? Yes.    Has the patient been a risk to self in the past 6 months? Yes.    Has the patient been a risk to self within the distant past? Yes.    Is the patient a risk to others? No.  Has the patient been a risk to others in the past 6 months? No.  Has the patient been a risk to others within the distant past? No.   Prior Inpatient Therapy:  as above Prior Outpatient Therapy:  follows up at [facility]   Alcohol Screening: 1. How often do you have a drink containing alcohol?: Never 9. Have you or someone else been injured as a result of your drinking?: No 10. Has a relative or friend or a doctor or another health worker been concerned about your drinking or suggested you cut down?: No Alcohol Use Disorder Identification Test Final Score (AUDIT): 0 Brief Intervention: AUDIT score less than 7 or less-screening does not suggest unhealthy drinking-brief intervention not indicated Substance Abuse History in the last 12 months:  Denies alcohol or drug abuse  Consequences of Substance Abuse: Denies  Previous Psychotropic Medications: States she has been compliant with her psychiatric medications, and received her last ABILIFY SUSR IM 400 mgr about two weeks ago, and has bee getting it monthly- next dose  is scheduled for 4/17.  Psychological Evaluations: No  Past Medical History:  Past Medical History:  Diagnosis Date  . Anemia   . Anxiety   . Depression   . Elevated bilirubin    slight elevation at 1.3  .  Hypertension   . Hypokalemia   . Intracranial hypertension   . Migraines     Past Surgical History:  Procedure Laterality Date  . NO PAST SURGERIES     Family History: Father died from MI, mother is alive, has three brother, one sister died from complications of sickle cell disease Family History  Problem Relation Age of Onset  . Schizophrenia Mother   . Hypertension Mother   . Anesthesia problems Neg Hx   . Malignant hyperthermia Neg Hx   . Pseudochol deficiency Neg Hx   . Hypotension Neg Hx   . Migraines Neg Hx    Family Psychiatric  History: states mother may have Bipolar Disorder, no suicides in family, mother has history of substance abuse, now in remission Tobacco Screening: Have you used any form of tobacco in the last 30 days? (Cigarettes, Smokeless Tobacco, Cigars, and/or Pipes): Yes Tobacco use, Select all that apply: 5 or more cigarettes per day Are you interested in Tobacco Cessation Medications?: Yes, will notify MD for an order Counseled patient on smoking cessation including recognizing danger situations, developing coping skills and basic information about quitting provided: Yes Social History: married x 9 months, no children, lives with husband, on disability, no legal issues  History  Alcohol Use No    Comment: denied all drug/alcohol use     History  Drug Use No    Comment: denied all drug/alcohol use    Additional Social History: Marital status: Married Number of Years Married: 1 What types of issues is patient dealing with in the relationship?: His symptoms of Schizophrenia and Bipolar Disorder make him change moods a lot.  They just had an argument prior to pt's admission.  She believes they will stay together. Are you sexually active?: Yes What is your sexual orientation?: Straight Does patient have children?: No    Pain Medications: see MAR Prescriptions: diamox   norvasc   abilifly   cogentin   HCTZ   vistaril   lqamictal   robaxin   reglan   zofran    trazadone Over the Counter: see MAR History of alcohol / drug use?: No history of alcohol / drug abuse Longest period of sobriety (when/how long): pt denied alal drug/alcohol use Negative Consequences of Use: Personal relationships, Financial Withdrawal Symptoms: Other (Comment) (denied withdrawals)  Allergies:  PCN  Lab Results:  Results for orders placed or performed during the hospital encounter of 01/01/17 (from the past 48 hour(s))  Comprehensive metabolic panel     Status: Abnormal   Collection Time: 01/01/17  5:01 PM  Result Value Ref Range   Sodium 141 135 - 145 mmol/L   Potassium 3.6 3.5 - 5.1 mmol/L   Chloride 111 101 - 111 mmol/L   CO2 24 22 - 32 mmol/L   Glucose, Bld 114 (H) 65 - 99 mg/dL   BUN 16 6 - 20 mg/dL   Creatinine, Ser 0.90 0.44 - 1.00 mg/dL   Calcium 8.8 (L) 8.9 - 10.3 mg/dL   Total Protein 7.4 6.5 - 8.1 g/dL   Albumin 3.6 3.5 - 5.0 g/dL   AST 21 15 - 41 U/L   ALT 10 (L) 14 - 54 U/L   Alkaline Phosphatase 82 38 - 126  U/L   Total Bilirubin 0.8 0.3 - 1.2 mg/dL   GFR calc non Af Amer >60 >60 mL/min   GFR calc Af Amer >60 >60 mL/min    Comment: (NOTE) The eGFR has been calculated using the CKD EPI equation. This calculation has not been validated in all clinical situations. eGFR's persistently <60 mL/min signify possible Chronic Kidney Disease.    Anion gap 6 5 - 15  Ethanol     Status: None   Collection Time: 01/01/17  5:01 PM  Result Value Ref Range   Alcohol, Ethyl (B) <5 <5 mg/dL    Comment:        LOWEST DETECTABLE LIMIT FOR SERUM ALCOHOL IS 5 mg/dL FOR MEDICAL PURPOSES ONLY   Salicylate level     Status: None   Collection Time: 01/01/17  5:01 PM  Result Value Ref Range   Salicylate Lvl <6.3 2.8 - 30.0 mg/dL  Acetaminophen level     Status: Abnormal   Collection Time: 01/01/17  5:01 PM  Result Value Ref Range   Acetaminophen (Tylenol), Serum <10 (L) 10 - 30 ug/mL    Comment:        THERAPEUTIC CONCENTRATIONS VARY SIGNIFICANTLY. A RANGE  OF 10-30 ug/mL MAY BE AN EFFECTIVE CONCENTRATION FOR MANY PATIENTS. HOWEVER, SOME ARE BEST TREATED AT CONCENTRATIONS OUTSIDE THIS RANGE. ACETAMINOPHEN CONCENTRATIONS >150 ug/mL AT 4 HOURS AFTER INGESTION AND >50 ug/mL AT 12 HOURS AFTER INGESTION ARE OFTEN ASSOCIATED WITH TOXIC REACTIONS.   cbc     Status: Abnormal   Collection Time: 01/01/17  5:01 PM  Result Value Ref Range   WBC 8.5 4.0 - 10.5 K/uL   RBC 3.55 (L) 3.87 - 5.11 MIL/uL   Hemoglobin 10.1 (L) 12.0 - 15.0 g/dL   HCT 30.8 (L) 36.0 - 46.0 %   MCV 86.8 78.0 - 100.0 fL   MCH 28.5 26.0 - 34.0 pg   MCHC 32.8 30.0 - 36.0 g/dL   RDW 15.1 11.5 - 15.5 %   Platelets 475 (H) 150 - 400 K/uL  Rapid urine drug screen (hospital performed)     Status: Abnormal   Collection Time: 01/01/17  6:16 PM  Result Value Ref Range   Opiates NONE DETECTED NONE DETECTED   Cocaine NONE DETECTED NONE DETECTED   Benzodiazepines NONE DETECTED NONE DETECTED   Amphetamines NONE DETECTED NONE DETECTED   Tetrahydrocannabinol POSITIVE (A) NONE DETECTED   Barbiturates NONE DETECTED NONE DETECTED    Comment:        DRUG SCREEN FOR MEDICAL PURPOSES ONLY.  IF CONFIRMATION IS NEEDED FOR ANY PURPOSE, NOTIFY LAB WITHIN 5 DAYS.        LOWEST DETECTABLE LIMITS FOR URINE DRUG SCREEN Drug Class       Cutoff (ng/mL) Amphetamine      1000 Barbiturate      200 Benzodiazepine   335 Tricyclics       456 Opiates          300 Cocaine          300 THC              50     Blood Alcohol level:  Lab Results  Component Value Date   [facility] <5 01/01/2017   ETH <5 25/63/8937    Metabolic Disorder Labs:  Lab Results  Component Value Date   HGBA1C 5.8 (H) 08/19/2015   MPG 120 08/19/2015   Lab Results  Component Value Date   PROLACTIN 15.6 08/19/2015   Lab Results  Component Value Date   CHOL 141 08/19/2015   TRIG 83 08/19/2015   HDL 52 08/19/2015   CHOLHDL 2.7 08/19/2015   VLDL 17 08/19/2015   LDLCALC 72 08/19/2015    Current  Medications: Current Facility-Administered Medications  Medication Dose Route Frequency Provider Last Rate Last Dose  . magnesium hydroxide (MILK OF MAGNESIA) suspension 30 mL  30 mL Oral Daily PRN Benjamine Mola, FNP      . nicotine (NICODERM CQ - dosed in mg/24 hours) patch 21 mg  21 mg Transdermal Daily Jenne Campus, MD   21 mg at 01/03/17 0843  . traZODone (DESYREL) tablet 50 mg  50 mg Oral QHS,MR X 1 Rozetta Nunnery, NP       PTA Medications: Prescriptions Prior to Admission  Medication Sig Dispense Refill Last Dose  . acetaZOLAMIDE (DIAMOX) 500 MG capsule Take 1 capsule (500 mg total) by mouth 3 (three) times daily. 90 capsule 12 12/31/2016 at Unknown time  . amLODipine (NORVASC) 5 MG tablet Take 5 mg by mouth every morning.   01/01/2017 at Unknown time  . ARIPiprazole 400 MG SUSR Inject 400 mg into the muscle every 30 (thirty) days. Prior authorization completed confirmation [id number] Next dose due Sep 14 2015 1 each  Past Week at Unknown time  . benztropine (COGENTIN) 1 MG tablet Take 1 tablet (1 mg total) by mouth at bedtime. 30 tablet 0 Past Week at Unknown time  . hydrochlorothiazide (HYDRODIURIL) 25 MG tablet Take 1 tablet (25 mg total) by mouth daily. (Patient not taking: Reported on 01/01/2017) 30 tablet 0 Completed Course at Unknown time  . hydrOXYzine (ATARAX/VISTARIL) 25 MG tablet Take 1 tablet (25 mg total) by mouth 3 (three) times daily as needed for anxiety. 30 tablet 0 12/31/2016 at Unknown time  . ibuprofen (ADVIL,MOTRIN) 600 MG tablet Take 1 tablet (600 mg total) by mouth every 6 (six) hours as needed. (Patient not taking: Reported on 01/01/2017) 30 tablet 0 Completed Course at Unknown time  . lamoTRIgine (LAMICTAL) 25 MG tablet Take 1 tablet (25 mg total) by mouth 2 (two) times daily. 30 tablet 0 12/31/2016 at 1900  . methocarbamol (ROBAXIN) 500 MG tablet Take 1 tablet (500 mg total) by mouth every 8 (eight) hours as needed for muscle spasms. (Patient not taking: Reported  on 04/11/2016) 40 tablet 0 Not Taking at Unknown time  . metoCLOPramide (REGLAN) 10 MG tablet Take 1 tablet (10 mg total) by mouth every 6 (six) hours. (Patient not taking: Reported on 01/01/2017) 30 tablet 0 Completed Course at Unknown time  . ondansetron (ZOFRAN ODT) 4 MG disintegrating tablet Take 1 tablet (4 mg total) by mouth every 8 (eight) hours as needed for nausea or vomiting. (Patient not taking: Reported on 04/11/2016) 10 tablet 0 Not Taking at Unknown time  . traZODone (DESYREL) 150 MG tablet Take 1 tablet (150 mg total) by mouth at bedtime. (Patient taking differently: Take 150 mg by mouth at bedtime as needed for sleep. ) 30 tablet 0 Past Week at Unknown time    Musculoskeletal: Strength & Muscle Tone: within normal limits Gait & Station: normal Patient leans: N/A  Psychiatric Specialty Exam: Physical Exam  Review of Systems  Constitutional: Negative.   HENT: Negative.   Eyes: Negative.   Respiratory: Negative.   Cardiovascular: Negative.   Gastrointestinal: Negative.   Genitourinary: Negative.   Musculoskeletal: Negative.   Skin: Negative.   Neurological: Negative for seizures.  Endo/Heme/Allergies: Negative.   Psychiatric/Behavioral: Positive for depression and  suicidal ideas.  All other systems reviewed and are negative.   Blood pressure 140/74, pulse 77, temperature 98.8 F (37.1 C), temperature source Oral, resp. rate 18, height 5' 6.5" (1.689 m), weight 110.2 kg (243 lb), last menstrual period 11/21/2016, SpO2 100 %.Body mass index is 38.63 kg/m.  General Appearance: Fairly Groomed  Eye Contact:  Good  Speech:  Slow  Volume:  Decreased  Mood:  reports she is feeling better, less depressed today  Affect:  vaguely constricted, but smiles appropriately at times   Thought Process:  Linear and Descriptions of Associations: Intact  Orientation:  Other:  fully alert and attentive   Thought Content:  denies hallucinations at this time and does not appear internally  preoccupied, no delusions expressed   Suicidal Thoughts:  No denies any suicidal or self injurious ideations at this time and contracts for safety on unit   Homicidal Thoughts:  No denies any homicidal ideations and also specifically denies any HI or violent ideations towards husband   Memory:  recent and remote grossly intact   Judgement:  Fair  Insight:  Fair  Psychomotor Activity:  Decreased  Concentration:  Concentration: Good and Attention Span: Good  Recall:  Good  Fund of Knowledge:  Good  Language:  Fair  Akathisia:  Negative  Handed:  Right  AIMS (if indicated):     Assets:  Desire for Improvement Resilience  ADL's:  Intact  Cognition:  WNL  Sleep:  Number of Hours: 6.75    Treatment Plan Summary: Daily contact with patient to assess and evaluate symptoms and progress in treatment, Medication management, Plan inpatient treatment  and medications as below  Observation Level/Precautions:  15 minute checks  Laboratory:  as needed- HgbA1C, Prolactin, Lipid Panel  Psychotherapy:  [place], group therapy   Medications:  Would continue Abilify SUSR, next Im dose due 01/21/17.  Will start low dose PO Abilify due to persistence of some psychotic symptoms. Prefer low dose as these hallucinations are now improving, resolving  Start Abilify 2 mgrs QHS  Vistaril PRNs for anxiety, Trazodone PRNs for insomnia Agrees to SSRI trial to address depression. Start Zoloft 25 mgrs QAM initially, side effects discussed   Consultations:  As needed   Discharge Concerns:  -  Estimated LOS: 5 days   Other:     Physician Treatment Plan for Primary Diagnosis: Schizoaffective Disorder, Depressed  Long Term Goal(s): Improvement in symptoms so as ready for discharge  Short Term Goals: Ability to disclose and discuss suicidal ideas, Ability to demonstrate self-control will improve, Ability to identify and develop effective coping behaviors will improve, Ability to maintain clinical measurements within  normal limits will improve and Compliance with prescribed medications will improve  Physician Treatment Plan for Secondary Diagnosis: Active Problems:   MDD (major depressive disorder), recurrent severe, without psychosis ([place])  Long Term Goal(s): Improvement in symptoms so as ready for discharge  Short Term Goals: Ability to disclose and discuss suicidal ideas, Ability to demonstrate self-control will improve, Ability to identify and develop effective coping behaviors will improve and Ability to maintain clinical measurements within normal limits will improve  I certify that inpatient services furnished can reasonably be expected to improve the patient's condition.    Jenne Campus, MD 3/30/201811:06 AM

## 2017-01-03 NOTE — Progress Notes (Signed)
Patient denies SI, HI and AVH.  Patient has been withdrawn, flat and depressed.  Patient forwards Fails in conversation.   Assess patient for safety, offer medications as prescribed, engage patient in 1:1 staff talk.   Continue to monitor as planned. Patient able to contract for safety.

## 2017-01-03 NOTE — Social Work (Signed)
Referred to [facility], is [place] resident.  Arnetta Odeh, LCSW Lead Clinical Social Worker Phone:  [phone]  

## 2017-01-03 NOTE — [facility] Group Notes (Signed)
[facility] LCSW Group Therapy  01/03/2017  1:05 PM  Type of Therapy:  Group therapy  Participation Level:  Active  Participation Quality:  Attentive  Affect:  Flat  Cognitive:  Oriented  Insight:  Limited  Engagement in Therapy:  Limited  Modes of Intervention:  Discussion, Socialization  Summary of Progress/Problems:  Chaplain was here to lead a group on themes of hope and courage. "Support is something that I get by reaching out to others and asking."  Reluctant to offer more. Stayed the entire time, engaged throughout.  Sarah [name] 01/03/2017 1:05 PM

## 2017-01-04 DIAGNOSIS — F332 Major depressive disorder, recurrent severe without psychotic features: Secondary | ICD-10-CM

## 2017-01-04 DIAGNOSIS — Z87891 Personal history of nicotine dependence: Secondary | ICD-10-CM

## 2017-01-04 LAB — LIPID PANEL
Cholesterol: 179 mg/dL (ref 0–200)
HDL: 58 mg/dL (ref >40)
LDL CALC: 89 mg/dL (ref 0–99)
Total CHOL/HDL Ratio: 3.1 RATIO
Triglycerides: 158 mg/dL — ABNORMAL HIGH (ref <150)
VLDL: 32 mg/dL (ref 0–40)

## 2017-01-04 NOTE — Progress Notes (Signed)
Patient denies SI, HI and AVH.  Patient has been withdrawn, flat and depressed.  Patient forwards [place] in conversation. Patient has been in her room for the entire shift though she came out for meals and medication.   Assess patient for safety, offer medications as prescribed, engage patient in 1:1 staff talk.   Continue to monitor as planned. Patient able to contract for safety

## 2017-01-04 NOTE — [facility] Group Notes (Signed)
[facility] Group Notes:  (Clinical Social Work)  01/04/2017  11:15-12:00PM  Summary of Progress/Problems:   Today's process group involved patients discussing their feelings related to being hospitalized, as well as benefits they see to being in the hospital. We also talked about activities that various patients like to engage in when spring comes around, using that as a springboard to discussing how to stay well and in the community so that they can enjoy doing all their favorite things rather than coming back to the hospital. The patient expressed a primary feeling about being hospitalized is that it is helping her in many ways and that she needed it.  She smiled a lot in group, did not talk unless called on directly.  She left the room several times, said she was going to the restroom.  Type of Therapy:  Group Therapy - Process  Participation Level:  Minimal  Participation Quality:  Attentive  Affect:  Blunted and Excited  Cognitive:  Disorganized  Insight:  Improving  Engagement in Therapy:  Improving  Modes of Intervention:  Exploration, Discussion  Selmer Dominion, LCSW 01/04/2017, 1:28 PM

## 2017-01-04 NOTE — Progress Notes (Signed)
[facility] MD Progress Note  01/04/2017  Sarah [name]  MRN:  [id number]  Subjective: Sarah [name] reports, "I was depressed x 3 days, then I got into an argument with my husband. I was brought to the hospital".  Objective Sarah [name] is seen, chart reviewed. She is alert, oriented x 3. She is visible on the unit, participating in the group sessions. She says she was depressed x 3 days, got into an argument with husband & she was brought to the hospital. She says she is feeling better. She denies any new issues or concerns. Sarah [name] presents very disheveled with foul body odor. She continues to need encouragement to maintain personal hygiene. She does not appear to be responding to any internal stimuli.  Principal Problem: MDD (major depressive disorder), recurrent severe, without psychosis ([place]) Diagnosis:   Patient Active Problem List   Diagnosis Date Noted  . MDD (major depressive disorder), recurrent severe, without psychosis ([place]) [F33.2] 01/02/2017  . IIH (idiopathic intracranial hypertension) [G93.2] 01/24/2016  . Intellectual disability [F79] 08/18/2015  . Cannabis use disorder, moderate, dependence ([place]) [F12.20] 08/18/2015  . Schizoaffective disorder ([place]) [F25.9] 08/17/2015   Total Time spent with patient: 25 minutes  Past Psychiatric History: Schizoaffective disorder.  Past Medical History:  Past Medical History:  Diagnosis Date  . Anemia   . Anxiety   . Depression   . Elevated bilirubin    slight elevation at 1.3  . Hypertension   . Hypokalemia   . Intracranial hypertension   . Migraines     Past Surgical History:  Procedure Laterality Date  . NO PAST SURGERIES     Family History:  Family History  Problem Relation Age of Onset  . Schizophrenia Mother   . Hypertension Mother   . Anesthesia problems Neg Hx   . Malignant hyperthermia Neg Hx   . Pseudochol deficiency Neg Hx   . Hypotension Neg Hx   . Migraines Neg Hx    Family Psychiatric  History: Schizophrenia:  Mother  History  Alcohol Use No    Comment: denied all drug/alcohol use     History  Drug Use No    Comment: denied all drug/alcohol use    Social History   Social History  . Marital status: Single    Spouse name: N/A  . Number of children: 0  . Years of education: 12   Occupational History  . Unemployed    Social History Main Topics  . Smoking status: Former Smoker    Packs/day: 1.00    Years: 15.00    Types: Cigarettes    Quit date: 01/02/2017  . Smokeless tobacco: Never Used     Comment: smoked since 41 yrs old  . Alcohol use No     Comment: denied all drug/alcohol use  . Drug use: No     Comment: denied all drug/alcohol use  . Sexual activity: Yes    Birth control/ protection: None   Other Topics Concern  . None   Social History Narrative   Lives with fiance, Sarah [name]   Caffeine use: Soda: 1 soda daily   No coffee   No tea   Additional Social History:    Pain Medications: see MAR Prescriptions: diamox   norvasc   abilifly   cogentin   HCTZ   vistaril   lqamictal   robaxin   reglan   zofran   trazadone Over the Counter: see MAR History of alcohol / drug use?: No history of alcohol / drug abuse Longest period  of sobriety (when/how long): pt denied alal drug/alcohol use Negative Consequences of Use: Personal relationships, Financial Withdrawal Symptoms: Other (Comment) (denied withdrawals)  Sleep: Fair   Appetite:  Good  Current Medications: Current Facility-Administered Medications  Medication Dose Route Frequency Provider Last Rate Last Dose  . ARIPiprazole (ABILIFY) tablet 2 mg  2 mg Oral Daily Jenne Campus, MD   2 mg at 01/04/17 0847  . hydrOXYzine (ATARAX/VISTARIL) tablet 25 mg  25 mg Oral Q6H PRN [name] [name], MD      . magnesium hydroxide (MILK OF MAGNESIA) suspension 30 mL  30 mL Oral Daily PRN Benjamine Mola, FNP   30 mL at 01/03/17 1831  . nicotine (NICODERM CQ - dosed in mg/24 hours) patch 21 mg  21 mg Transdermal Daily Jenne Campus, MD   21 mg at 01/04/17 0847  . sertraline (ZOLOFT) tablet 25 mg  25 mg Oral Daily Jenne Campus, MD   25 mg at 01/04/17 0847  . traZODone (DESYREL) tablet 50 mg  50 mg Oral QHS,MR X 1 Rozetta Nunnery, NP       Lab Results:  Results for orders placed or performed during the hospital encounter of 01/02/17 (from the past 48 hour(s))  Pregnancy, urine     Status: None   Collection Time: 01/03/17 11:38 AM  Result Value Ref Range   Preg Test, Ur NEGATIVE NEGATIVE    Comment:        THE SENSITIVITY OF THIS METHODOLOGY IS >20 mIU/mL. Performed at [facility], [place] [address] [place], [place] [address]   Lipid panel     Status: Abnormal   Collection Time: 01/04/17  6:15 AM  Result Value Ref Range   Cholesterol 179 0 - 200 mg/dL   Triglycerides 158 (H) <150 mg/dL   HDL 58 >40 mg/dL   Total CHOL/HDL Ratio 3.1 RATIO   VLDL 32 0 - 40 mg/dL   LDL Cholesterol 89 0 - 99 mg/dL    Comment:        Total Cholesterol/HDL:CHD Risk Coronary Heart Disease Risk Table                     Men   Women  1/2 Average Risk   3.4   3.3  Average Risk       5.0   4.4  2 X Average Risk   9.6   7.1  3 X Average Risk  23.4   11.0        Use the calculated Patient Ratio above and the CHD Risk Table to determine the patient's CHD Risk.        ATP III CLASSIFICATION (LDL):  <100     mg/dL   Optimal  100-129  mg/dL   Near or Above                    Optimal  130-159  mg/dL   Borderline  160-189  mg/dL   High  >190     mg/dL   Very High Performed at [facility] [address] [place], [place] [address]    Physical Findings: AIMS: Facial and Oral Movements Muscles of Facial Expression: None, normal Lips and Perioral Area: None, normal Jaw: None, normal Tongue: None, normal,Extremity Movements Upper (arms, wrists, hands, fingers): None, normal Lower (legs, knees, ankles, toes): None, normal, Trunk Movements Neck, shoulders, hips: None, normal, Overall Severity Severity  of abnormal movements (highest score  from questions above): None, normal Incapacitation due to abnormal movements: None, normal Patient's awareness of abnormal movements (rate only patient's report): No Awareness, Dental Status Current problems with teeth and/or dentures?: No Does patient usually wear dentures?: No  CIWA:  CIWA-Ar Total: 2 COWS:  COWS Total Score: 2  Musculoskeletal: Strength & Muscle Tone: within normal limits Gait & Station: normal Patient leans: N/A  Psychiatric Specialty Exam: Review of Systems  Psychiatric/Behavioral: Positive for depression and substance abuse. The patient is nervous/anxious and has insomnia.   All other systems reviewed and are negative.   Blood pressure 139/85, pulse (!) 102, temperature 98.1 F (36.7 C), resp. rate 18, height 5' 6.5" (1.689 m), weight 110.2 kg (243 lb), last menstrual period 11/21/2016, SpO2 100 %.Body mass index is 38.63 kg/m.  General Appearance: Disheveled  Eye Contact::  Good  Speech:  Clear and Coherent and Normal Rate  Volume:  Normal  Mood:  Anxious and Depressed  Affect:  Congruent  Thought Process:  Coherent   Orientation:  Full (Time, Place, and Person)  Thought Content:  Rumination   Suicidal Thoughts:  No  Homicidal Thoughts:  No  Memory:  Immediate;   Fair Recent;   Fair Remote;   Fair  Judgement:  Impaired  Insight:  Lacking  Psychomotor Activity:  Decreased  Concentration:  Fair  Recall:  [employer] of Knowledge:Fair  Language: Fair  Akathisia:  No  Handed:  Right  AIMS (if indicated):   0  Assets:  Desire for Improvement  ADL's:  Intact  Cognition: WNL  Sleep:  Number of Hours: 4.25   Treatment Plan Summary: Daily contact with patient to assess and evaluate symptoms and progress in treatment and Medication management  01-04-17: Reviewed current plan of care, no changes made.  Will continue Abilify 2 mg po qhs for psychosis.  Will continue Trazodone 50 mg po qhs for sleep.  Will  continue to monitor vitals, medication compliance and treatment side effects while patient is here.   Will monitor for medical issues as well as call for consult as needed.  CSW will start working on disposition.   Patient to participate in therapeutic milieu.   Encarnacion Slates, MD, PMHNP, FNP-BC 01/04/2017, 2:18 PM Patient ID: Sarah [name], female   DOB: October 17, 1975, 41 y.o.   MRN: [id number]

## 2017-01-04 NOTE — Progress Notes (Signed)
D: Pt at the time of assessment was flat and withdrawn to his room. Pt at the time denied depression, anxiety, pain, SI, HI or AVH; she states, "I felt better everyday." Pt remained calm and cooperative. A: Medications offered as prescribed. All patient's questions and concerns addressed. Support, encouragement, and safe environment provided. 15-minute safety checks continue.   R: Pt was med compliant. Pt attended wrap-up group. Safety checks continue.

## 2017-01-05 LAB — HEMOGLOBIN A1C
Hgb A1c MFr Bld: 5.4 % (ref 4.8–5.6)
MEAN PLASMA GLUCOSE: 108 mg/dL

## 2017-01-05 NOTE — Progress Notes (Signed)
D: Pt is alert and oriented x4. Pt denies depression, anxiety, pain SI/HI and AVH at the time of assessment. She states, I feel much better today; it has been very peaceful (with a smile)." Pt remained calm and cooperative through the assessment. A: Medications offered as prescribed. All patient's questions and concerns addressed. Support, encouragement, and safe environment provided. 15-minute safety checks continue.   R: Pt was med compliant. Pt attended wrap-up group. Safety checks continue

## 2017-01-05 NOTE — Progress Notes (Signed)
D: Patient's self inventory sheet: patient has good sleep, did not receive sleep medication.good  Appetite, normal energy level, good concentration. Rated depression 0/10, hopeless 0/10, anxiety 0/10. SI/HI/AVH: deneis all. Physical complaints are denied. Goal is "going to group". Plans to work on "go to group" .   A: Medications administered, assessed medication knowledge and education given on medication regimen.  Emotional support and encouragement given patient. R: Denies SI and HI , contracts for safety. Safety maintained with 15 minute checks.

## 2017-01-05 NOTE — Plan of Care (Signed)
Problem: Safety: Goal: Periods of time without injury will increase Outcome: Progressing Pt. remains a low fall risk, denies SI/HI/AVH at this time, Q 15 checks in effect.

## 2017-01-05 NOTE — [facility]HH Group Notes (Signed)
[facility] Group Notes:  (Clinical Social Work)  01/05/2017  11:00AM-12:00PM  Summary of Progress/Problems:  The main focus of today's process group was to listen to a variety of genres of music and to identify that different types of music provoke different responses.  The patient then was able to identify personally what was soothing for them, as well as energizing, as well as how patient can personally use this knowledge in sleep habits, with depression, and with other symptoms.  The patient expressed at the beginning of group the overall feeling of "good" and at the end of group said she felt "better."  She had her eyes closed but was singing along or nodding her head to the music throughout, was not sleeping.  Type of Therapy:  Music Therapy   Participation Level:  Active  Participation Quality:  Attentive   Affect:  Blunted  Cognitive:  Oriented  Insight:  Engaged  Engagement in Therapy:  Engaged  Modes of Intervention:   Activity, Exploration  Selmer Dominion, LCSW 01/05/2017

## 2017-01-05 NOTE — Progress Notes (Signed)
[facility] MD Progress Note  01/05/2017  KATELYND BLAUVELT  MRN:  [id number]  Subjective: Liliana reports, "I'm doing better. I don't feel like killing myself any more"  Objective Kayliegh is seen, chart reviewed. She is alert, oriented x 3. She is visible on the unit, participating in the group sessions. She says she doing & feeling better today. Denies any thoughts about killing herself. She denies any new issues or concerns. [place] presents very disheveled with foul body odor. She continues to need encouragement to maintain personal hygiene. She does not appear to be responding to any internal stimuli.  Principal Problem: MDD (major depressive disorder), recurrent severe, without psychosis ([place]) Diagnosis:   Patient Active Problem List   Diagnosis Date Noted  . MDD (major depressive disorder), recurrent severe, without psychosis ([place]) [F33.2] 01/02/2017  . IIH (idiopathic intracranial hypertension) [G93.2] 01/24/2016  . Intellectual disability [F79] 08/18/2015  . Cannabis use disorder, moderate, dependence ([place]) [F12.20] 08/18/2015  . Schizoaffective disorder ([place]) [F25.9] 08/17/2015   Total Time spent with patient: 15 minutes  Past Psychiatric History: Schizoaffective disorder.  Past Medical History:  Past Medical History:  Diagnosis Date  . Anemia   . Anxiety   . Depression   . Elevated bilirubin    slight elevation at 1.3  . Hypertension   . Hypokalemia   . Intracranial hypertension   . Migraines     Past Surgical History:  Procedure Laterality Date  . NO PAST SURGERIES     Family History:  Family History  Problem Relation Age of Onset  . Schizophrenia Mother   . Hypertension Mother   . Anesthesia problems Neg Hx   . Malignant hyperthermia Neg Hx   . Pseudochol deficiency Neg Hx   . Hypotension Neg Hx   . Migraines Neg Hx    Family Psychiatric  History: Schizophrenia: Mother  History  Alcohol Use No    Comment: denied all drug/alcohol use     History  Drug Use No     Comment: denied all drug/alcohol use    Social History   Social History  . Marital status: Single    Spouse name: N/A  . Number of children: 0  . Years of education: 12   Occupational History  . Unemployed    Social History Main Topics  . Smoking status: Former Smoker    Packs/day: 1.00    Years: 15.00    Types: Cigarettes    Quit date: 01/02/2017  . Smokeless tobacco: Never Used     Comment: smoked since 41 yrs old  . Alcohol use No     Comment: denied all drug/alcohol use  . Drug use: No     Comment: denied all drug/alcohol use  . Sexual activity: Yes    Birth control/ protection: None   Other Topics Concern  . None   Social History Narrative   Lives with fiance, Erlene Quan   Caffeine use: Soda: 1 soda daily   No coffee   No tea   Additional Social History:    Pain Medications: see MAR Prescriptions: diamox   norvasc   abilifly   cogentin   HCTZ   vistaril   lqamictal   robaxin   reglan   zofran   trazadone Over the Counter: see MAR History of alcohol / drug use?: No history of alcohol / drug abuse Longest period of sobriety (when/how long): pt denied alal drug/alcohol use Negative Consequences of Use: Personal relationships, Financial Withdrawal Symptoms: Other (Comment) (denied withdrawals)  Sleep: Fair   Appetite:  Good  Current Medications: Current Facility-Administered Medications  Medication Dose Route Frequency Provider Last Rate Last Dose  . ARIPiprazole (ABILIFY) tablet 2 mg  2 mg Oral Daily Jenne Campus, MD   2 mg at 01/05/17 1008  . hydrOXYzine (ATARAX/VISTARIL) tablet 25 mg  25 mg Oral Q6H PRN [name] [name], MD      . magnesium hydroxide (MILK OF MAGNESIA) suspension 30 mL  30 mL Oral Daily PRN Benjamine Mola, FNP   30 mL at 01/04/17 2122  . nicotine (NICODERM CQ - dosed in mg/24 hours) patch 21 mg  21 mg Transdermal Daily Jenne Campus, MD   21 mg at 01/05/17 1008  . sertraline (ZOLOFT) tablet 25 mg  25 mg Oral Daily Jenne Campus,  MD   25 mg at 01/05/17 1008  . traZODone (DESYREL) tablet 50 mg  50 mg Oral QHS,MR X 1 Rozetta Nunnery, NP       Lab Results:  Results for orders placed or performed during the hospital encounter of 01/02/17 (from the past 48 hour(s))  Hemoglobin A1c     Status: None   Collection Time: 01/04/17  6:15 AM  Result Value Ref Range   Hgb A1c MFr Bld 5.4 4.8 - 5.6 %    Comment: (NOTE)         Pre-diabetes: 5.7 - 6.4         Diabetes: >6.4         Glycemic control for adults with diabetes: <7.0    Mean Plasma Glucose 108 mg/dL    Comment: (NOTE) Performed At: [facility] [place], [place] [id number] Lindon Romp MD QI:[id number] Performed at [facility], [place] [address], [place], [place] [address]   Lipid panel     Status: Abnormal   Collection Time: 01/04/17  6:15 AM  Result Value Ref Range   Cholesterol 179 0 - 200 mg/dL   Triglycerides 158 (H) <150 mg/dL   HDL 58 >40 mg/dL   Total CHOL/HDL Ratio 3.1 RATIO   VLDL 32 0 - 40 mg/dL   LDL Cholesterol 89 0 - 99 mg/dL    Comment:        Total Cholesterol/HDL:CHD Risk Coronary Heart Disease Risk Table                     Men   Women  1/2 Average Risk   3.4   3.3  Average Risk       5.0   4.4  2 X Average Risk   9.6   7.1  3 X Average Risk  23.4   11.0        Use the calculated Patient Ratio above and the CHD Risk Table to determine the patient's CHD Risk.        ATP III CLASSIFICATION (LDL):  <100     mg/dL   Optimal  100-129  mg/dL   Near or Above                    Optimal  130-159  mg/dL   Borderline  160-189  mg/dL   High  >190     mg/dL   Very High Performed at [facility] [address] [place], [place] [address]    Physical Findings: AIMS: Facial and Oral Movements Muscles of Facial Expression: None, normal Lips and Perioral Area: None, normal Jaw: None, normal  Tongue: None, normal,Extremity Movements Upper (arms, wrists, hands, fingers): None, normal Lower  (legs, knees, ankles, toes): None, normal, Trunk Movements Neck, shoulders, hips: None, normal, Overall Severity Severity of abnormal movements (highest score from questions above): None, normal Incapacitation due to abnormal movements: None, normal Patient's awareness of abnormal movements (rate only patient's report): No Awareness, Dental Status Current problems with teeth and/or dentures?: No Does patient usually wear dentures?: No  CIWA:  CIWA-Ar Total: 2 COWS:  COWS Total Score: 2  Musculoskeletal: Strength & Muscle Tone: within normal limits Gait & Station: normal Patient leans: N/A  Psychiatric Specialty Exam: Review of Systems  Psychiatric/Behavioral: Positive for depression and substance abuse. The patient is nervous/anxious and has insomnia.   All other systems reviewed and are negative.   Blood pressure 133/87, pulse 80, temperature 98.5 F (36.9 C), resp. rate 20, height 5' 6.5" (1.689 m), weight 110.2 kg (243 lb), last menstrual period 11/21/2016, SpO2 100 %.Body mass index is 38.63 kg/m.  General Appearance: Disheveled  Eye Contact::  Good  Speech:  Clear and Coherent and Normal Rate  Volume:  Normal  Mood:  Anxious and Depressed  Affect:  Congruent  Thought Process:  Coherent   Orientation:  Full (Time, Place, and Person)  Thought Content:  Rumination   Suicidal Thoughts:  No  Homicidal Thoughts:  No  Memory:  Immediate;   Fair Recent;   Fair Remote;   Fair  Judgement:  Impaired  Insight:  Lacking  Psychomotor Activity:  Decreased  Concentration:  Fair  Recall:  [employer] of Knowledge:Fair  Language: Fair  Akathisia:  No  Handed:  Right  AIMS (if indicated):   0  Assets:  Desire for Improvement  ADL's:  Intact  Cognition: WNL  Sleep:  Number of Hours: 5.75   Treatment Plan Summary: Daily contact with patient to assess and evaluate symptoms and progress in treatment and Medication management  01-05-17: Reviewed current plan of care, no changes  made.  Will continue Abilify 2 mg po qhs for psychosis.  Will continue Trazodone 50 mg po qhs for sleep.  Will continue to monitor vitals, medication compliance and treatment side effects while patient is here.   Will monitor for medical issues as well as call for consult as needed.  CSW will start working on disposition.   Patient to participate in therapeutic milieu.   Encarnacion Slates, MD, PMHNP, FNP-BC 01/05/2017, 2:18 PM Patient ID: Sarah [name], female   DOB: 1975/10/10, 41 y.o.   MRN: [id number]

## 2017-01-05 NOTE — Progress Notes (Signed)
  DATA ACTION RESPONSE  Objective- Pt. is visible in her room, seen resting in bed with eyes open. Presents with a sad/depressed affect and mood. Minimal and guarded with interaction. No c/o. Isolative to milieu.  Subjective- Denies having any SI/HI/AVH/Pain at this time. Pt. states " I been in my bed a lot". Resistant to care. Remains safe on the unit.  1:1 interaction in private to establish rapport. Encouragement, education, & support given from staff. Meds. ordered and refused.   Safety maintained with Q 15 checks. Continues to follow treatment plan and will monitor closely. No additonal questions/concerns noted.

## 2017-01-06 ENCOUNTER — Encounter (HOSPITAL_COMMUNITY): Payer: Self-pay | Admitting: Psychiatry

## 2017-01-06 DIAGNOSIS — F250 Schizoaffective disorder, bipolar type: Secondary | ICD-10-CM | POA: Diagnosis present

## 2017-01-06 DIAGNOSIS — F70 Mild intellectual disabilities: Secondary | ICD-10-CM

## 2017-01-06 LAB — PROLACTIN: Prolactin: 18.7 ng/mL (ref 4.8–23.3)

## 2017-01-06 MED ORDER — HYDROXYZINE HCL 25 MG PO TABS: 25.0000 mg | ORAL_TABLET | Freq: Four times a day (QID) | ORAL | 0 refills | Status: DC | PRN

## 2017-01-06 MED ORDER — ARIPIPRAZOLE 2 MG PO TABS: 2.0000 mg | ORAL_TABLET | Freq: Every day | ORAL | 0 refills | Status: DC

## 2017-01-06 MED ORDER — TRAZODONE HCL 50 MG PO TABS: 50.0000 mg | ORAL_TABLET | Freq: Every evening | ORAL | 0 refills | Status: DC | PRN

## 2017-01-06 MED ORDER — SERTRALINE HCL 25 MG PO TABS: 25.0000 mg | ORAL_TABLET | Freq: Every day | ORAL | 0 refills | Status: DC

## 2017-01-06 NOTE — Progress Notes (Signed)
Recreation Therapy Notes  Date: 01/06/17 Time: 1000 Location: 500 Hall Dayroom  Group Topic: Coping Skills  Goal Area(s) Addresses:  Patients will be able to identify positive coping skills. Patients will be able to identify the benefits of coping skills. Patients will be able to identify the benefits of using coping skills post d/c.  Behavioral Response: Engaged  Intervention: Magazines, coping skills worksheet, scissors, glue sticks, Architect paper  Activity: Coping Skills.  Patients were to identify coping skills in the magazines that could be used for diversions, social, cognitive, tension releasers and physical.  Patients were to cut out pictures of the coping skills they identified and glue them under section where they would be most useful to them.  Education: Radiographer, therapeutic, Dentist.   Education Outcome: Acknowledges understanding/In group clarification offered/Needs additional education.   Clinical Observations/Feedback: Pt identified her coping skills as going on vacation, traveling with family, watching movies, reading and exercise.  Pt expressed using her coping skills will "make me less stressed".   Victorino Sparrow, LRT/CTRS         Victorino Sparrow A 01/06/2017 12:33 PM

## 2017-01-06 NOTE — Social Work (Signed)
Referred to [facility] Transitional Care Team, is [place] resident.  Rumaysa Sabatino, LCSW Lead Clinical Social Worker Phone:  [phone]  

## 2017-01-06 NOTE — Progress Notes (Addendum)
  [facility] Adult Case Management Discharge Plan : Note updated by A Jeydan Barner LCSW to reflect additional discharge information.  Will you be returning to the same living situation after discharge:  Yes,  home At discharge, do you have transportation home?: Yes,  family Do you have the ability to pay for your medications: Yes,  MCD  Release of information consent forms completed and in the chart;  Patient's signature needed at discharge.  Patient to Follow up at: Follow-up Information    [facility] Follow up.   Specialty:  Behavioral Health Why:  January 10, 2017 at 10:40am with  Dr. Maretta Los. Injection Appointment is January 21, 2017 at 1:00pm .  Patient accepted Transitional Care Team, services begin on day of discharge.    Contact information: [place] [address] [phone]           Next level of care provider has access to [place] and Suicide Prevention discussed: Yes,  yes  Have you used any form of tobacco in the last 30 days? (Cigarettes, Smokeless Tobacco, Cigars, and/or Pipes): Yes  Has patient been referred to the Quitline?: Patient refused referral  Patient has been referred for addiction treatment: N/A  Beverely Pace 01/09/2017, 10:23 AM

## 2017-01-06 NOTE — [facility] Suicide Risk Assessment (Signed)
[facility] Discharge Suicide Risk Assessment   Principal Problem: Schizoaffective disorder, bipolar type [facility]) Discharge Diagnoses:  Patient Active Problem List   Diagnosis Date Noted  . Schizoaffective disorder, bipolar type ([place]) [F25.0] 01/06/2017  . 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 IIH (idiopathic intracranial hypertension) [G93.2] 01/24/2016  . Cannabis use disorder, moderate, dependence ([place]) [F12.20] 08/18/2015    Total Time spent with patient: 30 minutes  Musculoskeletal: Strength & Muscle Tone: within normal limits Gait & Station: normal Patient leans: N/A  Psychiatric Specialty Exam: Review of Systems  Psychiatric/Behavioral: Negative for depression and suicidal ideas. The patient is not nervous/anxious.   All other systems reviewed and are negative.   Blood pressure (!) 144/106, pulse 83, temperature 98.8 F (37.1 C), temperature source Oral, resp. rate (!) 24, height 5' 6.5" (1.689 m), weight 110.2 kg (243 lb), last menstrual period 11/21/2016, SpO2 100 %.Body mass index is 38.63 kg/m.  General Appearance: Casual  Eye Contact::  Fair  Speech:  Clear and [id number]  Volume:  Normal  Mood:  Euthymic  Affect:  Appropriate  Thought Process:  Goal Directed  Orientation:  Full (Time, Place, and Person)  Thought Content:  Logical  Suicidal Thoughts:  No  Homicidal Thoughts:  No  Memory:  Immediate;   Fair Recent;   Fair Remote;   Fair  Judgement:  Fair  Insight:  Fair  Psychomotor Activity:  Normal  Concentration:  Fair  Recall:  [employer] of Knowledge:Fair  Language: Fair  Akathisia:  No  Handed:  Right  AIMS (if indicated):   0  Assets:  Communication Skills Desire for Improvement  Sleep:  Number of Hours: 6.5  Cognition: WNL  ADL's:  Intact   Mental Status Per Nursing Assessment::   On Admission:     Demographic Factors:  NA  Loss Factors: NA  Historical Factors: Impulsivity  Risk Reduction Factors:   Living with another  person, especially a relative, Positive social support and Positive therapeutic relationship  Continued Clinical Symptoms:  Previous Psychiatric Diagnoses and Treatments  Cognitive Features That Contribute To Risk:  None    Suicide Risk:  Minimal: No identifiable suicidal ideation.  Patients presenting with no risk factors but with morbid ruminations; may be classified as minimal risk based on the severity of the depressive symptoms  Follow-up Information    [facility] Follow up.   Specialty:  Behavioral Health Why:  January 10, 2017 at 10:40am with  Dr. Maretta Los. Injection Appointment is January 21, 2017 at 1:00pm .  Contact information: [place]  [phone]           Plan Of Care/Follow-up recommendations:  Activity:  NO RESTRICTIONS Diet:  Regular Tests:  as per outpatient recommendations Other:  Reports due for Abilify Maintena IM 400 mg on 01/21/17. She is aware to get it that date.  Sonakshi Rolland, MD 01/06/2017, 10:23 AM

## 2017-01-06 NOTE — Tx Team (Signed)
Interdisciplinary Treatment and Diagnostic Plan Update  01/06/2017 Time of Session: 9:27 AM  Sarah [name] MRN: [id number]  Principal Diagnosis: Schizoaffective disorder Secondary Diagnoses: Principal Problem:   MDD (major depressive disorder), recurrent severe, without psychosis ([place])   Current Medications:  Current Facility-Administered Medications  Medication Dose Route Frequency Provider Last Rate Last Dose  . ARIPiprazole (ABILIFY) tablet 2 mg  2 mg Oral Daily Jenne Campus, MD   2 mg at 01/06/17 1540  . hydrOXYzine (ATARAX/VISTARIL) tablet 25 mg  25 mg Oral Q6H PRN [name] [name], MD      . magnesium hydroxide (MILK OF MAGNESIA) suspension 30 mL  30 mL Oral Daily PRN Benjamine Mola, FNP   30 mL at 01/04/17 2122  . nicotine (NICODERM CQ - dosed in mg/24 hours) patch 21 mg  21 mg Transdermal Daily Jenne Campus, MD   21 mg at 01/05/17 1008  . sertraline (ZOLOFT) tablet 25 mg  25 mg Oral Daily Jenne Campus, MD   25 mg at 01/06/17 0816  . traZODone (DESYREL) tablet 50 mg  50 mg Oral QHS,MR X 1 Rozetta Nunnery, NP        PTA Medications: Prescriptions Prior to Admission  Medication Sig Dispense Refill Last Dose  . acetaZOLAMIDE (DIAMOX) 500 MG capsule Take 1 capsule (500 mg total) by mouth 3 (three) times daily. 90 capsule 12 12/31/2016 at Unknown time  . amLODipine (NORVASC) 5 MG tablet Take 5 mg by mouth every morning.   01/01/2017 at Unknown time  . ARIPiprazole 400 MG SUSR Inject 400 mg into the muscle every 30 (thirty) days. Prior authorization completed confirmation [id number] Next dose due Sep 14 2015 1 each  Past Week at Unknown time  . benztropine (COGENTIN) 1 MG tablet Take 1 tablet (1 mg total) by mouth at bedtime. 30 tablet 0 Past Week at Unknown time  . hydrochlorothiazide (HYDRODIURIL) 25 MG tablet Take 1 tablet (25 mg total) by mouth daily. (Patient not taking: Reported on 01/01/2017) 30 tablet 0 Completed Course at Unknown time  . hydrOXYzine  (ATARAX/VISTARIL) 25 MG tablet Take 1 tablet (25 mg total) by mouth 3 (three) times daily as needed for anxiety. 30 tablet 0 12/31/2016 at Unknown time  . ibuprofen (ADVIL,MOTRIN) 600 MG tablet Take 1 tablet (600 mg total) by mouth every 6 (six) hours as needed. (Patient not taking: Reported on 01/01/2017) 30 tablet 0 Completed Course at Unknown time  . lamoTRIgine (LAMICTAL) 25 MG tablet Take 1 tablet (25 mg total) by mouth 2 (two) times daily. 30 tablet 0 12/31/2016 at 1900  . methocarbamol (ROBAXIN) 500 MG tablet Take 1 tablet (500 mg total) by mouth every 8 (eight) hours as needed for muscle spasms. (Patient not taking: Reported on 04/11/2016) 40 tablet 0 Not Taking at Unknown time  . metoCLOPramide (REGLAN) 10 MG tablet Take 1 tablet (10 mg total) by mouth every 6 (six) hours. (Patient not taking: Reported on 01/01/2017) 30 tablet 0 Completed Course at Unknown time  . ondansetron (ZOFRAN ODT) 4 MG disintegrating tablet Take 1 tablet (4 mg total) by mouth every 8 (eight) hours as needed for nausea or vomiting. (Patient not taking: Reported on 04/11/2016) 10 tablet 0 Not Taking at Unknown time  . traZODone (DESYREL) 150 MG tablet Take 1 tablet (150 mg total) by mouth at bedtime. (Patient taking differently: Take 150 mg by mouth at bedtime as needed for sleep. ) 30 tablet 0 Past Week at Unknown time  Treatment Modalities: Medication Management, Group therapy, Case management,  1 to 1 session with clinician, Psychoeducation, Recreational therapy.   Physician Treatment Plan for Primary Diagnosis: Schizoaffective disorder Long Term Goal(s): Improvement in symptoms so as ready for discharge  Short Term Goals: Ability to disclose and discuss suicidal ideas and Ability to identify and develop effective coping behaviors will improve  Medication Management: Evaluate patient's response, side effects, and tolerance of medication regimen.  Therapeutic Interventions: 1 to 1 sessions, Unit Group sessions and  Medication administration.  Evaluation of Outcomes: Adequate for Discharge  Physician Treatment Plan for Secondary Diagnosis: Principal Problem:   MDD (major depressive disorder), recurrent severe, without psychosis ([name])   Long Term Goal(s): Improvement in symptoms so as ready for discharge  Short Term Goals: Compliance with prescribed medications will improve  Medication Management: Evaluate patient's response, side effects, and tolerance of medication regimen.  Therapeutic Interventions: 1 to 1 sessions, Unit Group sessions and Medication administration.  Evaluation of Outcomes: Adequate for Discharge   RN Treatment Plan for Primary Diagnosis:Schizoaffective disorder Long Term Goal(s): Knowledge of disease and therapeutic regimen to maintain health will improve  Short Term Goals: Ability to disclose and discuss suicidal ideas and Compliance with prescribed medications will improve  Medication Management: RN will administer medications as ordered by provider, will assess and evaluate patient's response and provide education to patient for prescribed medication. RN will report any adverse and/or side effects to prescribing provider.  Therapeutic Interventions: 1 on 1 counseling sessions, Psychoeducation, Medication administration, Evaluate responses to treatment, Monitor vital signs and CBGs as ordered, Perform/monitor CIWA, COWS, AIMS and Fall Risk screenings as ordered, Perform wound care treatments as ordered.  Evaluation of Outcomes: Adequate for Discharge   LCSW Treatment Plan for Primary Diagnosis:  Schizoaffective disorder Long Term Goal(s): Safe transition to appropriate next level of care at discharge, Engage patient in therapeutic group addressing interpersonal concerns.  Short Term Goals: Engage patient in aftercare planning with referrals and resources and Increase skills for wellness and recovery  Therapeutic Interventions: Assess for all discharge needs, 1 to 1 time  with Social worker, Explore available resources and support systems, Assess for adequacy in community support network, Educate family and significant other(s) on suicide prevention, Complete Psychosocial Assessment, Interpersonal group therapy.  Evaluation of Outcomes: Adequate for Discharge   Progress in Treatment: Attending groups: Yes Participating in groups: Yes Taking medication as prescribed: Yes, MD continues to assess for medication changes as needed Toleration medication: Yes, no side effects reported at this time Family/Significant other contact made: No, patient declined  Patient understands diagnosis: Limited insight  Discussing patient identified problems/goals with staff: Yes Medical problems stabilized or resolved: Yes Denies suicidal/homicidal ideation: Yes Issues/concerns per patient self-inventory: None Other: N/A  New problem(s) identified: None identified at this time.   New Short Term/Long Term Goal(s): None identified at this time.   Discharge Plan or Barriers: Return home and follow up with [facility].   Reason for Continuation of Hospitalization:  Estimated Length of Stay: D/C today  Attendees: Patient: 01/06/2017  9:27 AM  Physician: Dr. Shea Evans 01/06/2017  9:27 AM  Nursing: Grayland Ormond, RN  01/06/2017  9:27 AM  RN Care Manager: Lars Pinks 01/06/2017  9:27 AM  Social Worker: Ripley Fraise, LCSW 01/06/2017  9:27 AM  Recreational Therapist: Winfield Cunas 01/06/2017  9:27 AM  Other: Radonna Ricker, Social Work Intern  01/06/2017  9:27 AM  Other:  01/06/2017  9:27 AM  Other: 01/06/2017  9:27 AM    Scribe  for Treatment Team: [facility] LCSW 01/06/2017 9:27 AM

## 2017-01-06 NOTE — Progress Notes (Signed)
Patient ID: Sarah [name], female   DOB: 28-Aug-1976, 41 y.o.   MRN: [id number] Patient discharged to home/selfcare.  Patient had a flat affect upon discharge but stated that she was ready to go.  Patient denies SI, HI and AVH and stated that she would continue her treatment within the community. Patient acknowledged understanding of all discharge instructions.

## 2017-01-06 NOTE — Discharge Summary (Signed)
Physician Discharge Summary Note  Patient:  Sarah [name] is an 41 y.o., female MRN:  [id number] DOB:  1976/06/28 Patient phone:  [phone] (home)  Patient address:   [address] [place] [address],  Total Time spent with patient: 30 minutes   Date of Admission:  01/02/2017 Date of Discharge: 01/06/2017  Reason for Admission:  overdose  Principal Problem: Schizoaffective disorder, bipolar type [facility]) Discharge Diagnoses: Patient Active Problem List   Diagnosis Date Noted  . Schizoaffective disorder, bipolar type ([place]) [F25.0] 01/06/2017  . 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 IIH (idiopathic intracranial hypertension) [G93.2] 01/24/2016  . Cannabis use disorder, moderate, dependence ([place]) [F12.20] 08/18/2015    Past Psychiatric History: see HPI  Past Medical History:  Past Medical History:  Diagnosis Date  . Anemia   . Anxiety   . Depression   . Elevated bilirubin    slight elevation at 1.3  . Hypertension   . Hypokalemia   . Intracranial hypertension   . Migraines     Past Surgical History:  Procedure Laterality Date  . NO PAST SURGERIES     Family History:  Family History  Problem Relation Age of Onset  . Schizophrenia Mother   . Hypertension Mother   . Anesthesia problems Neg Hx   . Malignant hyperthermia Neg Hx   . Pseudochol deficiency Neg Hx   . Hypotension Neg Hx   . Migraines Neg Hx    Family Psychiatric  History: see HPI Social History:  History  Alcohol Use No    Comment: denied all drug/alcohol use     History  Drug Use No    Comment: denied all drug/alcohol use    Social History   Social History  . Marital status: Single    Spouse name: N/A  . Number of children: 0  . Years of education: 12   Occupational History  . Unemployed    Social History Main Topics  . Smoking status: Former Smoker    Packs/day: 1.00    Years: 15.00    Types: Cigarettes    Quit date: 01/02/2017  . Smokeless tobacco: Never Used      Comment: smoked since 41 yrs old  . Alcohol use No     Comment: denied all drug/alcohol use  . Drug use: No     Comment: denied all drug/alcohol use  . Sexual activity: Yes    Birth control/ protection: None   Other Topics Concern  . None   Social History Narrative   Lives with fiance, Erlene Quan   Caffeine use: Soda: 1 soda daily   No coffee   No tea    Hospital Course:   Danetra Tosh, 41 yo female came in voluntarily  With recent worsening depression, which she attributed to marital difficulties .  Reported developing suicidal ideations, with thoughts of overdosing on medications. Three days prior, patient stated that she had overdosed on about 10 tablets each of acetaminophen, Ibuprofen.    Accalia N Sorey was admitted for Schizoaffective disorder, bipolar type ([place]) and crisis management.  Patient was treated with medications with their indications listed below in detail under Medication List.  Medical problems were identified and treated as needed.  Home medications were restarted as appropriate.  Improvement was monitored by observation and Alina N Voller daily report of symptom reduction.  Emotional and mental status was monitored by daily self inventory reports completed by Georgiana Shore Giannattasio and clinical staff.  Patient reported continued improvement, denied any  new concerns.  Patient had been compliant on medications and denied side effects.  Support and encouragement was provided.    Elyza N Trickey was evaluated by the treatment team for stability and plans for continued recovery upon discharge.  Patient was offered further treatment options upon discharge including Residential, Intensive Outpatient and Outpatient treatment. Patient will follow up with agency listed below for medication management and counseling.  Encouraged patient to maintain satisfactory support network and home environment.  Advised to adhere to medication compliance and outpatient treatment follow up.   Prescriptions provided.       Suzan N Lacorte motivation was an integral factor for scheduling further treatment.  Employment, transportation, bed availability, health status, family support, and any pending legal issues were also considered during patient's hospital stay.  Upon completion of this admission the patient was both mentally and medically stable for discharge denying suicidal/homicidal ideation, auditory/visual/tactile hallucinations, delusional thoughts and paranoia.      Physical Findings: AIMS: Facial and Oral Movements Muscles of Facial Expression: None, normal Lips and Perioral Area: None, normal Jaw: None, normal Tongue: None, normal,Extremity Movements Upper (arms, wrists, hands, fingers): None, normal Lower (legs, knees, ankles, toes): None, normal, Trunk Movements Neck, shoulders, hips: None, normal, Overall Severity Severity of abnormal movements (highest score from questions above): None, normal Incapacitation due to abnormal movements: None, normal Patient's awareness of abnormal movements (rate only patient's report): No Awareness, Dental Status Current problems with teeth and/or dentures?: No Does patient usually wear dentures?: No  CIWA:  CIWA-Ar Total: 2 COWS:  COWS Total Score: 2  Musculoskeletal: Strength & Muscle Tone: within normal limits Gait & Station: normal Patient leans: N/A  Psychiatric Specialty Exam: Physical Exam  Nursing note and vitals reviewed.   ROS  Blood pressure (!) 144/106, pulse 83, temperature 98.8 F (37.1 C), temperature source Oral, resp. rate (!) 24, height 5' 6.5" (1.689 m), weight 110.2 kg (243 lb), last menstrual period 11/21/2016, SpO2 100 %.Body mass index is 38.63 kg/m.    Have you used any form of tobacco in the last 30 days? (Cigarettes, Smokeless Tobacco, Cigars, and/or Pipes): Yes  Has this patient used any form of tobacco in the last 30 days? (Cigarettes, Smokeless Tobacco, Cigars, and/or Pipes) Yes, N/A  Blood  Alcohol level:  Lab Results  Component Value Date   [facility] <5 01/01/2017   ETH <5 78/29/5621    Metabolic Disorder Labs:  Lab Results  Component Value Date   HGBA1C 5.4 01/04/2017   MPG 108 01/04/2017   MPG 120 08/19/2015   Lab Results  Component Value Date   PROLACTIN 18.7 01/04/2017   PROLACTIN 15.6 08/19/2015   Lab Results  Component Value Date   CHOL 179 01/04/2017   TRIG 158 (H) 01/04/2017   HDL 58 01/04/2017   CHOLHDL 3.1 01/04/2017   VLDL 32 01/04/2017   LDLCALC 89 01/04/2017   LDLCALC 72 08/19/2015    See Psychiatric Specialty Exam and Suicide Risk Assessment completed by Attending Physician prior to discharge.  Discharge destination:  Home  Is patient on multiple antipsychotic therapies at discharge:  No   Has Patient had three or more failed trials of antipsychotic monotherapy by history:  No  Recommended Plan for Multiple Antipsychotic Therapies: NA  Discharge Instructions    Discharge instructions    Complete by:  As directed    Please get your Abilify Injection on April 17th as you indicated it is when you're due next for it.     Allergies  as of 01/06/2017      Reactions   Penicillins Anaphylaxis   Has patient had a PCN reaction causing immediate rash, facial/tongue/throat swelling, SOB or lightheadedness with hypotension: throat and face swelling.  Has patient had a PCN reaction causing severe rash involving mucus membranes or skin necrosis: Has patient had a PCN reaction that required hospitalization Yes Has patient had a PCN reaction occurring within the last 10 years: No If all of the above answers are "NO", then may proceed with Cephalosporin use.      Medication List    STOP taking these medications   acetaZOLAMIDE 500 MG capsule Commonly known as:  DIAMOX   amLODipine 5 MG tablet Commonly known as:  NORVASC   ARIPiprazole ER 400 MG Susr Replaced by:  ARIPiprazole 2 MG tablet   benztropine 1 MG tablet Commonly known as:  COGENTIN    hydrochlorothiazide 25 MG tablet Commonly known as:  HYDRODIURIL   ibuprofen 600 MG tablet Commonly known as:  ADVIL,MOTRIN   lamoTRIgine 25 MG tablet Commonly known as:  LAMICTAL   methocarbamol 500 MG tablet Commonly known as:  ROBAXIN   metoCLOPramide 10 MG tablet Commonly known as:  REGLAN   ondansetron 4 MG disintegrating tablet Commonly known as:  ZOFRAN ODT     TAKE these medications     Indication  ARIPiprazole 2 MG tablet Commonly known as:  ABILIFY Take 1 tablet (2 mg total) by mouth daily. Start taking on:  01/07/2017 Replaces:  ARIPiprazole ER 400 MG Susr  Indication:  Mixed Bipolar Affective Disorder   hydrOXYzine 25 MG tablet Commonly known as:  ATARAX/VISTARIL Take 1 tablet (25 mg total) by mouth every 6 (six) hours as needed for anxiety. What changed:  when to take this  Indication:  Anxiety Neurosis   sertraline 25 MG tablet Commonly known as:  ZOLOFT Take 1 tablet (25 mg total) by mouth daily. Start taking on:  01/07/2017  Indication:  Major Depressive Disorder   traZODone 50 MG tablet Commonly known as:  DESYREL Take 1 tablet (50 mg total) by mouth at bedtime and may repeat dose one time if needed. What changed:  medication strength  how much to take  when to take this  Indication:  Trouble Sleeping      Follow-up Information    [facility] Follow up.   Specialty:  Behavioral Health Why:  January 10, 2017 at 10:40am with  Dr. Maretta Los. Injection Appointment is January 21, 2017 at 1:00pm .  Contact information: [place] [phone]           Follow-up recommendations:  Activity:  as tol Diet:  as tol  Comments:  1.  Take all your medications as prescribed.   2.  Report any adverse side effects to outpatient provider. 3.  Patient instructed to not use alcohol or illegal drugs while on prescription medicines. 4.  In the event of worsening symptoms, instructed patient to call 911, the crisis hotline or  go to nearest emergency room for evaluation of symptoms.  Signed: Janett Labella, NP  [facility] 01/06/2017, 2:26 PM

## 2017-02-18 ENCOUNTER — Other Ambulatory Visit: Payer: Self-pay

## 2017-02-18 ENCOUNTER — Emergency Department (HOSPITAL_COMMUNITY)
Admission: EM | Admit: 2017-02-18 | Discharge: 2017-02-19 | Disposition: A | Discharge status: Other Acute Inpatient Hospital | Payer: Medicaid Other | Attending: Emergency Medicine | Admitting: Emergency Medicine

## 2017-02-18 DIAGNOSIS — I10 Essential (primary) hypertension: Secondary | ICD-10-CM | POA: Diagnosis not present

## 2017-02-18 DIAGNOSIS — R45851 Suicidal ideations: Secondary | ICD-10-CM

## 2017-02-18 DIAGNOSIS — T391X2A Poisoning by 4-Aminophenol derivatives, intentional self-harm, initial encounter: Secondary | ICD-10-CM | POA: Insufficient documentation

## 2017-02-18 DIAGNOSIS — Z79899 Other long term (current) drug therapy: Secondary | ICD-10-CM | POA: Insufficient documentation

## 2017-02-18 DIAGNOSIS — T50902A Poisoning by unspecified drugs, medicaments and biological substances, intentional self-harm, initial encounter: Secondary | ICD-10-CM

## 2017-02-18 DIAGNOSIS — Z87891 Personal history of nicotine dependence: Secondary | ICD-10-CM | POA: Diagnosis not present

## 2017-02-18 LAB — COMPREHENSIVE METABOLIC PANEL
ALT: 10 U/L — ABNORMAL LOW (ref 14–54)
AST: 15 U/L (ref 15–41)
Albumin: 3.7 g/dL (ref 3.5–5.0)
Alkaline Phosphatase: 65 U/L (ref 38–126)
Anion gap: 7 (ref 5–15)
BUN: 16 mg/dL (ref 6–20)
CO2: 25 mmol/L (ref 22–32)
Calcium: 8.6 mg/dL — ABNORMAL LOW (ref 8.9–10.3)
Chloride: 107 mmol/L (ref 101–111)
Creatinine, Ser: 0.79 mg/dL (ref 0.44–1.00)
GFR calc Af Amer: >60 mL/min (ref >60)
GFR calc non Af Amer: >60 mL/min (ref >60)
Glucose, Bld: 81 mg/dL (ref 65–99)
Potassium: 3.4 mmol/L — ABNORMAL LOW (ref 3.5–5.1)
Sodium: 139 mmol/L (ref 135–145)
Total Bilirubin: 0.6 mg/dL (ref 0.3–1.2)
Total Protein: 7.2 g/dL (ref 6.5–8.1)

## 2017-02-18 LAB — CBC WITH DIFFERENTIAL/PLATELET
Basophils Absolute: 0 10*3/uL (ref 0.0–0.1)
Basophils Relative: 0 %
Eosinophils Absolute: 0.4 10*3/uL (ref 0.0–0.7)
Eosinophils Relative: 5 %
HCT: 30.3 % — ABNORMAL LOW (ref 36.0–46.0)
Hemoglobin: 9.6 g/dL — ABNORMAL LOW (ref 12.0–15.0)
Lymphocytes Relative: 39 %
Lymphs Abs: 3.2 10*3/uL (ref 0.7–4.0)
MCH: 26.5 pg (ref 26.0–34.0)
MCHC: 31.7 g/dL (ref 30.0–36.0)
MCV: 83.7 fL (ref 78.0–100.0)
Monocytes Absolute: 0.6 10*3/uL (ref 0.1–1.0)
Monocytes Relative: 7 %
Neutro Abs: 4 10*3/uL (ref 1.7–7.7)
Neutrophils Relative %: 49 %
Platelets: 436 10*3/uL — ABNORMAL HIGH (ref 150–400)
RBC: 3.62 MIL/uL — ABNORMAL LOW (ref 3.87–5.11)
RDW: 16.7 % — ABNORMAL HIGH (ref 11.5–15.5)
WBC: 8.2 10*3/uL (ref 4.0–10.5)

## 2017-02-18 LAB — URINALYSIS, ROUTINE W REFLEX MICROSCOPIC
Bilirubin Urine: NEGATIVE
Glucose, UA: NEGATIVE mg/dL
Ketones, ur: 5 mg/dL — AB
Nitrite: NEGATIVE
Protein, ur: 30 mg/dL — AB
Specific Gravity, Urine: 1.033 — ABNORMAL HIGH (ref 1.005–1.030)
pH: 5 (ref 5.0–8.0)

## 2017-02-18 LAB — CBG MONITORING, ED: GLUCOSE-CAPILLARY: 61 mg/dL — AB (ref 65–99)

## 2017-02-18 LAB — RAPID URINE DRUG SCREEN, HOSP PERFORMED
Amphetamines: NOT DETECTED
Barbiturates: NOT DETECTED
Benzodiazepines: NOT DETECTED
Cocaine: NOT DETECTED
Opiates: NOT DETECTED
Tetrahydrocannabinol: POSITIVE — AB

## 2017-02-18 LAB — SALICYLATE LEVEL: Salicylate Lvl: <7 mg/dL (ref 2.8–30.0)

## 2017-02-18 LAB — ACETAMINOPHEN LEVEL: Acetaminophen (Tylenol), Serum: <10 ug/mL — ABNORMAL LOW (ref 10–30)

## 2017-02-18 LAB — ETHANOL: Alcohol, Ethyl (B): <5 mg/dL (ref <5)

## 2017-02-18 MED ORDER — ACETAMINOPHEN 325 MG PO TABS: 650.0000 mg | ORAL_TABLET | ORAL | Status: DC | PRN

## 2017-02-18 MED ORDER — ALUM & MAG HYDROXIDE-SIMETH 200-200-20 MG/5ML PO SUSP: 30.0000 mL | ORAL | Status: DC | PRN

## 2017-02-18 MED ORDER — ONDANSETRON HCL 4 MG PO TABS: 4.0000 mg | ORAL_TABLET | Freq: Three times a day (TID) | ORAL | Status: DC | PRN

## 2017-02-18 MED ORDER — ZOLPIDEM TARTRATE 5 MG PO TABS
5.0000 mg | ORAL_TABLET | Freq: Every evening | ORAL | Status: DC | PRN
Start: 2017-02-18 — End: 2017-02-19

## 2017-02-18 MED ORDER — IBUPROFEN 200 MG PO TABS: 600.0000 mg | ORAL_TABLET | Freq: Three times a day (TID) | ORAL | Status: DC | PRN

## 2017-02-18 NOTE — ED Triage Notes (Signed)
Pt coming from [facility] after ingesting 20 tylenol (500 mg) at 0600 today. Pt states she has suicidal ideations.

## 2017-02-18 NOTE — BH Assessment (Addendum)
Tele Assessment Note   Sarah [name] is an 41 y.o. female, who presents voluntary and unaccompanied to [facility]. Pt reported, overdosing today on 20 Tylenol tablets as a suicide attempt, due to her depression. Pt reported, not knowing the triggers to her suicide attempt. Pt reported, two previous suicide attempts. Pt reported, hearing voices, telling her to hurt herself and others in the past. Pt reported, she has not heard voices in a while. Pt denied, HI, AVH, access to weapons and self-injurious behaviors.  Pt denies abuse. Pt reported, smoking a pack of cigarettes daily. Pt's UDS is positive for marijuana. Pt reported, being linked to [facility] for medication management and counseling. Pt reported, her last visit was last month. Pt reported, previous inpatient admissions.   Pt presents quiet/awake with body odor in scrubs with logical/coherent speech. Pt's eye contact poor. Pt's mood/affect are depressed. Pt's thought process was coherent/relevent. Pt's judgement was unimpaired. Pt's concentration was normal. Pt's insight and impulse control are poor. Pt was oriented x4 (day, year, city and state). Pt reported, if discharged from [facility] she could not contract for safety. Pt reported, if inpatient was recommended she would sign-in voluntarily.   Diagnosis: Major Depressive Disorder, Recurrent, Severe without Psychotic Features.  Past Medical History:  Past Medical History:  Diagnosis Date  . Anemia   . Anxiety   . Depression   . Elevated bilirubin    slight elevation at 1.3  . Hypertension   . Hypokalemia   . Intracranial hypertension   . Migraines     Past Surgical History:  Procedure Laterality Date  . NO PAST SURGERIES      Family History:  Family History  Problem Relation Age of Onset  . Schizophrenia Mother   . Hypertension Mother   . Anesthesia problems Neg Hx   . Malignant hyperthermia Neg Hx   . Pseudochol deficiency Neg Hx   . Hypotension Neg Hx   . Migraines Neg Hx      Social History:  reports that she quit smoking about 6 weeks ago. Her smoking use included Cigarettes. She has a 15.00 pack-year smoking history. She has never used smokeless tobacco. She reports that she does not drink alcohol or use drugs.  Additional Social History:  Alcohol / Drug Use Pain Medications: See MAR Prescriptions: See MAR Over the Counter: See MAR History of alcohol / drug use?: Yes Substance #1 Name of Substance 1: Cigarettes 1 - Age of First Use: UTA 1 - Amount (size/oz): Pt reports, smoking a pack of cigarettes daily.  1 - Frequency: UTA 1 - Duration: UTA 1 - Last Use / Amount: Pt reported, daily.  CIWA: CIWA-Ar BP: 139/88 Pulse Rate: (!) 49 COWS:    PATIENT STRENGTHS: (choose at least two) Average or above average intelligence Supportive family/friends  Allergies:    Home Medications:  (Not in a hospital admission)  OB/GYN Status:  No LMP recorded.  General Assessment Data Location of Assessment: [facility] TTS Assessment: In system Is this a Tele or Face-to-Face Assessment?: Face-to-Face Is this an Initial Assessment or a Re-assessment for this encounter?: Initial Assessment Marital status: Married Is patient pregnant?: No Pregnancy Status: No Living Arrangements: Spouse/significant other Can pt return to current living arrangement?: Yes Admission Status: Voluntary Is patient capable of signing voluntary admission?: Yes Referral Source: Self/Family/Friend Insurance type: Medicaid     Crisis Care Plan Living Arrangements: Spouse/significant other Legal Guardian: Other: (Self) Name of Psychiatrist: [facility] Name of Therapist: [facility]  Education Status Is patient  currently in school?: No Current Grade: NA Highest grade of school patient has completed: 12th Name of school: NA Contact person: NA  Risk to self with the past 6 months Suicidal Ideation: Yes-Currently Present Has patient been a risk to self within the past 6 months prior to  admission? : Yes Suicidal Intent: Yes-Currently Present Has patient had any suicidal intent within the past 6 months prior to admission? : Yes Is patient at risk for suicide?: Yes Suicidal Plan?: Yes-Currently Present Has patient had any suicidal plan within the past 6 months prior to admission? : Yes Specify Current Suicidal Plan: Pt reported, taking 20 Tylenol tablets.  Access to Means: Yes Specify Access to Suicidal Means: Pt has access to Tylenol. What has been your use of drugs/alcohol within the last 12 months?: Pt's UDS is postive for marijuana, cigarettes.. Previous Attempts/Gestures: Yes How many times?: 3 Other Self Harm Risks: Pt denies. Triggers for Past Attempts: Unpredictable Intentional Self Injurious Behavior: None (Pt denies. ) Family Suicide History: No Recent stressful life event(s): Other (Comment) (Pt reported, depression. ) Persecutory voices/beliefs?: No Depression: Yes Depression Symptoms: Isolating, Guilt, Feeling worthless/self pity, Tearfulness Substance abuse history and/or treatment for substance abuse?: Yes Suicide prevention information given to non-admitted patients: Not applicable  Risk to Others within the past 6 months Homicidal Ideation: No (Pt denies. ) Does patient have any lifetime risk of violence toward others beyond the six months prior to admission? : No Thoughts of Harm to Others: No Current Homicidal Intent: No Current Homicidal Plan: No Access to Homicidal Means: No Identified Victim: NA History of harm to others?: No Assessment of Violence: None Noted Violent Behavior Description: NA Does patient have access to weapons?: No Criminal Charges Pending?: No Does patient have a court date: No Is patient on probation?: No  Psychosis Hallucinations: Auditory, Visual, With command (Pt reported, in the past. ) Delusions: None noted  Mental Status Report Appearance/Hygiene: In scrubs, Body odor Eye Contact: Fair Motor Activity:  Unremarkable Speech: Logical/coherent Level of Consciousness: Quiet/awake Mood: Depressed Affect: Depressed Anxiety Level: Minimal Thought Processes: Coherent, Relevant Judgement: Unimpaired Orientation: Other (Comment) (day, year, city and state. ) Obsessive Compulsive Thoughts/Behaviors: None  Cognitive Functioning Concentration: Normal Memory: Recent Intact IQ: Average Impulse Control: Poor Appetite: Good Weight Loss: 0 Weight Gain: 0 Sleep: No Change Total Hours of Sleep:  (Pt reported, sleeping good. ) Vegetative Symptoms: Staying in bed  ADLScreening [facility] Assessment Services) Patient's cognitive ability adequate to safely complete daily activities?: Yes Patient able to express need for assistance with ADLs?: Yes Independently performs ADLs?: Yes (appropriate for developmental age)  Prior Inpatient Therapy Prior Inpatient Therapy: Yes Prior Therapy Dates: 2016, 2011 (Per chart.) Prior Therapy Facilty/Provider(s): [facility] (Per chart. ) Reason for Treatment: Schizoaffective, schizophrenia (Per chart.)  Prior Outpatient Therapy Prior Outpatient Therapy: Yes Prior Therapy Dates: Current Prior Therapy Facilty/Provider(s): [facility] Reason for Treatment: Medication management and counseling Does patient have an ACCT team?: No Does patient have Intensive In-House Services?  : No Does patient have [facility] services? : Yes Does patient have P4CC services?: No  ADL Screening (condition at time of admission) Patient's cognitive ability adequate to safely complete daily activities?: Yes Is the patient deaf or have difficulty hearing?: No Does the patient have difficulty seeing, even when wearing glasses/contacts?: No Does the patient have difficulty concentrating, remembering, or making decisions?: No Patient able to express need for assistance with ADLs?: Yes Does the patient have difficulty dressing or bathing?: No Independently performs ADLs?: Yes (appropriate  for  developmental age) Does the patient have difficulty walking or climbing stairs?: No Weakness of Legs: None Weakness of Arms/Hands: None       Abuse/Neglect Assessment (Assessment to be complete while patient is alone) Physical Abuse: Denies (Pt denies. ) Verbal Abuse: Denies (Pt denies. ) Sexual Abuse: Denies (Pt denies. ) Exploitation of patient/patient's resources: Denies (Pt denies. ) Self-Neglect: Denies (Pt denies. )     Advance Directives (For Healthcare) Does Patient Have a Medical Advance Directive?: No    Additional Information 1:1 In Past 12 Months?: No CIRT Risk: No Elopement Risk: No Does patient have medical clearance?: No     Disposition: Pt has been accepted to [facility] by Tori, AC to room/bed: 403-1, at 2230. Attending physician: Dr. Parke Poisson. Accepting physician: Patriciaann Clan, PA. Nursing report" [phone]. Disposition discussed with Dr. Kathrynn Humble and Myriam Forehand, RN.  Disposition Initial Assessment Completed for this Encounter: Yes Disposition of Patient: Inpatient treatment program Type of inpatient treatment program: Adult  Edd Fabian 02/18/2017 9:15 PM   Edd Fabian, MS, [facility], [facility] Triage Specialist [phone]

## 2017-02-18 NOTE — ED Notes (Signed)
Bed: OD25 Expected date:  Expected time:  Means of arrival:  Comments: [facility] from [facility], tylenol OD

## 2017-02-18 NOTE — ED Notes (Signed)
Poison control notified  No recommendations at this time  Pt clear on their end

## 2017-02-18 NOTE — ED Notes (Signed)
Patient educated about search process and term "contraband " and routine search performed. No contraband found. 

## 2017-02-18 NOTE — ED Notes (Signed)
Patient admits to [facility] with a plan to over dose. Patient denies HI and AVH at this time. Plan of care discussed with patient. Encouragement and support provided and safety maintain Q 15 min safety checks remain in place and video monitoring.

## 2017-02-18 NOTE — ED Provider Notes (Signed)
[facility] DEPT Provider Note   CSN: [id number] Arrival date & time: 02/18/17  1540     History   Chief Complaint Chief Complaint  Patient presents with  . Ingestion  . Suicidal    HPI Sarah [name] is a 41 y.o. female.  HPI Patient presents to the emergency department with stating that she tried to kill himself today but taking 20 Tylenol early this morning.  The patient states that she has been graduating more depressed and attempted to harm her self.  She states that she has done this in the pastThe patient denies chest pain, shortness of breath, headache,blurred vision, neck pain, fever, cough, weakness, numbness, dizziness, anorexia, edema, abdominal pain, nausea, vomiting, diarrhea, rash, back pain, dysuria, hematemesis, bloody stool, near syncope, or syncope.  Patient denies any hallucinations Past Medical History:  Diagnosis Date  . Anemia   . Anxiety   . Depression   . Elevated bilirubin    slight elevation at 1.3  . Hypertension   . Hypokalemia   . Intracranial hypertension   . Migraines     Patient Active Problem List   Diagnosis Date Noted  . Schizoaffective disorder, bipolar type ([place]) 01/06/2017  . Mild intellectual disability 01/06/2017  . IIH (idiopathic intracranial hypertension) 01/24/2016  . Cannabis use disorder, moderate, dependence ([place]) 08/18/2015    Past Surgical History:  Procedure Laterality Date  . NO PAST SURGERIES      OB History    No data available       Home Medications    Prior to Admission medications   Medication Sig Start Date End Date Taking? Authorizing Provider  acetaminophen (TYLENOL) 500 MG tablet Take 500 mg by mouth once.   Yes [provider]  ARIPiprazole (ABILIFY) 2 MG tablet Take 1 tablet (2 mg total) by mouth daily. 01/07/17  Yes Kerrie Buffalo, NP  hydrOXYzine (ATARAX/VISTARIL) 25 MG tablet Take 1 tablet (25 mg total) by mouth every 6 (six) hours as needed for anxiety. 01/06/17  Yes Kerrie Buffalo, NP  risperiDONE (RISPERDAL) 2 MG tablet Take 2 mg by mouth at bedtime.  02/12/17 03/14/17 Yes [provider]  sertraline (ZOLOFT) 100 MG tablet Take 100 mg by mouth at bedtime.  02/13/17 03/15/17 Yes [provider]  sertraline (ZOLOFT) 25 MG tablet Take 1 tablet (25 mg total) by mouth daily. Patient not taking: Reported on 02/18/2017 01/07/17   Kerrie Buffalo, NP  traZODone (DESYREL) 50 MG tablet Take 1 tablet (50 mg total) by mouth at bedtime and may repeat dose one time if needed. Patient taking differently: Take 50 mg by mouth at bedtime as needed for sleep.  01/06/17   Kerrie Buffalo, NP    Family History Family History  Problem Relation Age of Onset  . Schizophrenia Mother   . Hypertension Mother   . Anesthesia problems Neg Hx   . Malignant hyperthermia Neg Hx   . Pseudochol deficiency Neg Hx   . Hypotension Neg Hx   . Migraines Neg Hx     Social History Social History  Substance Use Topics  . Smoking status: Former Smoker    Packs/day: 1.00    Years: 15.00    Types: Cigarettes    Quit date: 01/02/2017  . Smokeless tobacco: Never Used     Comment: smoked since 41 yrs old  . Alcohol use No     Comment: denied all drug/alcohol use     Allergies   Penicillins   Review of Systems Review of  Systems All other systems negative except as documented in the HPI. All pertinent positives and negatives as reviewed in the HPI.  Physical Exam Updated Vital Signs BP 139/88   Pulse (!) 49   Temp 98.1 F (36.7 C) (Oral)   Resp 19   Ht 5\' 6"  (1.676 m)   Wt 108.9 kg   SpO2 100%   BMI 38.74 kg/m   Physical Exam  Constitutional: She is oriented to person, place, and time. She appears well-developed and well-nourished. No distress.  HENT:  Head: Normocephalic and atraumatic.  Mouth/Throat: Oropharynx is clear and moist.  Eyes: Pupils are equal, round, and reactive to light.  Neck: Normal range of motion. Neck supple.  Cardiovascular: Normal rate, regular  rhythm and normal heart sounds.  Exam reveals no gallop and no friction rub.   No murmur heard. Pulmonary/Chest: Effort normal and breath sounds normal. No respiratory distress. She has no wheezes.  Abdominal: Soft. Bowel sounds are normal. She exhibits no distension. There is no tenderness.  Neurological: She is alert and oriented to person, place, and time. She exhibits normal muscle tone. Coordination normal.  Skin: Skin is warm and dry. Capillary refill takes less than 2 seconds. No rash noted. No erythema.  Psychiatric: Her behavior is normal. Thought content is not paranoid and not delusional. She exhibits a depressed mood. She expresses suicidal ideation. She expresses no homicidal ideation. She expresses suicidal plans. She expresses no homicidal plans.  Nursing note and vitals reviewed.    ED Treatments / Results  Labs (all labs ordered are listed, but only abnormal results are displayed) Labs Reviewed  COMPREHENSIVE METABOLIC PANEL - Abnormal; Notable for the following:       Result Value   Potassium 3.4 (*)    Calcium 8.6 (*)    ALT 10 (*)    All other components within normal limits  URINALYSIS, ROUTINE W REFLEX MICROSCOPIC - Abnormal; Notable for the following:    APPearance HAZY (*)    Specific Gravity, Urine 1.033 (*)    Hgb urine dipstick LARGE (*)    Ketones, ur 5 (*)    Protein, ur 30 (*)    Leukocytes, UA TRACE (*)    Bacteria, UA RARE (*)    Squamous Epithelial / LPF 6-30 (*)    All other components within normal limits  RAPID URINE DRUG SCREEN, HOSP PERFORMED - Abnormal; Notable for the following:    Tetrahydrocannabinol POSITIVE (*)    All other components within normal limits  ACETAMINOPHEN LEVEL - Abnormal; Notable for the following:    Acetaminophen (Tylenol), Serum <10 (*)    All other components within normal limits  CBC WITH DIFFERENTIAL/PLATELET - Abnormal; Notable for the following:    RBC 3.62 (*)    Hemoglobin 9.6 (*)    HCT 30.3 (*)    RDW  16.7 (*)    Platelets 436 (*)    All other components within normal limits  CBG MONITORING, ED - Abnormal; Notable for the following:    Glucose-Capillary 61 (*)    All other components within normal limits  ETHANOL  SALICYLATE LEVEL    EKG  EKG Interpretation None       Radiology No results found.  Procedures Procedures (including critical care time)  Medications Ordered in ED Medications  acetaminophen (TYLENOL) tablet 650 mg (not administered)  ibuprofen (ADVIL,MOTRIN) tablet 600 mg (not administered)  zolpidem (AMBIEN) tablet 5 mg (not administered)  ondansetron (ZOFRAN) tablet 4 mg (not administered)  alum &  mag hydroxide-simeth (MAALOX/MYLANTA) 200-200-20 MG/5ML suspension 30 mL (not administered)     Initial Impression / Assessment and Plan / ED Course  I have reviewed the triage vital signs and the nursing notes.  Pertinent labs & imaging results that were available during my care of the patient were reviewed by me and considered in my medical decision making (see chart for details).  Patient is medically clear and at this point need TTS consultation for her suicidal attempt  Final Clinical Impressions(s) / ED Diagnoses   Final diagnoses:  Suicidal ideation  Intentional drug overdose, initial encounter [facility])    New Prescriptions New Prescriptions   No medications on file     Dalia Heading, Hershal Coria 02/18/17 [place], [place], MD 02/21/17 1408

## 2017-02-19 ENCOUNTER — Encounter (HOSPITAL_COMMUNITY): Payer: Self-pay | Admitting: *Deleted

## 2017-02-19 ENCOUNTER — Inpatient Hospital Stay (HOSPITAL_COMMUNITY)
Admission: EM | Admit: 2017-02-19 | Discharge: 2017-02-24 | DRG: 885 | Disposition: A | Discharge status: Home / Self Care | Payer: Medicaid Other | Source: Intra-hospital | Attending: Psychiatry | Admitting: Psychiatry

## 2017-02-19 DIAGNOSIS — R45851 Suicidal ideations: Secondary | ICD-10-CM | POA: Diagnosis present

## 2017-02-19 DIAGNOSIS — F70 Mild intellectual disabilities: Secondary | ICD-10-CM | POA: Diagnosis present

## 2017-02-19 DIAGNOSIS — Z915 Personal history of self-harm: Secondary | ICD-10-CM

## 2017-02-19 DIAGNOSIS — I10 Essential (primary) hypertension: Secondary | ICD-10-CM | POA: Diagnosis present

## 2017-02-19 DIAGNOSIS — F17210 Nicotine dependence, cigarettes, uncomplicated: Secondary | ICD-10-CM | POA: Diagnosis present

## 2017-02-19 DIAGNOSIS — Z79899 Other long term (current) drug therapy: Secondary | ICD-10-CM | POA: Diagnosis not present

## 2017-02-19 DIAGNOSIS — G4700 Insomnia, unspecified: Secondary | ICD-10-CM | POA: Diagnosis present

## 2017-02-19 DIAGNOSIS — F250 Schizoaffective disorder, bipolar type: Secondary | ICD-10-CM | POA: Diagnosis present

## 2017-02-19 DIAGNOSIS — Z880 Allergy status to penicillin: Secondary | ICD-10-CM

## 2017-02-19 DIAGNOSIS — Z818 Family history of other mental and behavioral disorders: Secondary | ICD-10-CM | POA: Diagnosis not present

## 2017-02-19 DIAGNOSIS — F332 Major depressive disorder, recurrent severe without psychotic features: Secondary | ICD-10-CM | POA: Diagnosis present

## 2017-02-19 DIAGNOSIS — Z590 Homelessness: Secondary | ICD-10-CM

## 2017-02-19 DIAGNOSIS — F419 Anxiety disorder, unspecified: Secondary | ICD-10-CM | POA: Diagnosis present

## 2017-02-19 DIAGNOSIS — F1290 Cannabis use, unspecified, uncomplicated: Secondary | ICD-10-CM | POA: Diagnosis not present

## 2017-02-19 MED ORDER — NICOTINE 21 MG/24HR TD PT24
21.0000 mg | MEDICATED_PATCH | Freq: Every day | TRANSDERMAL | Status: DC
  Administered 2017-02-19 – 2017-02-23 (×5): 21 mg via TRANSDERMAL
  Filled 2017-02-19 (×7): qty 1

## 2017-02-19 MED ORDER — ARIPIPRAZOLE 2 MG PO TABS
2.0000 mg | ORAL_TABLET | Freq: Every day | ORAL | Status: DC
  Administered 2017-02-19: 2 mg via ORAL
  Filled 2017-02-19 (×3): qty 1

## 2017-02-19 MED ORDER — MAGNESIUM HYDROXIDE 400 MG/5ML PO SUSP: 30.0000 mL | Freq: Every day | ORAL | Status: DC | PRN

## 2017-02-19 MED ORDER — HYDROXYZINE HCL 25 MG PO TABS: 25.0000 mg | ORAL_TABLET | Freq: Four times a day (QID) | ORAL | Status: DC | PRN

## 2017-02-19 MED ORDER — ARIPIPRAZOLE 5 MG PO TABS
5.0000 mg | ORAL_TABLET | Freq: Every day | ORAL | Status: DC
  Administered 2017-02-20: 5 mg via ORAL
  Filled 2017-02-19 (×2): qty 1

## 2017-02-19 MED ORDER — AMLODIPINE BESYLATE 5 MG PO TABS
5.0000 mg | ORAL_TABLET | Freq: Every day | ORAL | Status: DC
  Administered 2017-02-19 – 2017-02-20 (×2): 5 mg via ORAL
  Filled 2017-02-19 (×4): qty 1

## 2017-02-19 MED ORDER — SERTRALINE HCL 100 MG PO TABS
100.0000 mg | ORAL_TABLET | Freq: Every day | ORAL | Status: DC
  Administered 2017-02-19 – 2017-02-23 (×5): 100 mg via ORAL
  Filled 2017-02-19 (×7): qty 1

## 2017-02-19 MED ORDER — TRAZODONE HCL 50 MG PO TABS: 50.0000 mg | ORAL_TABLET | Freq: Every evening | ORAL | Status: DC | PRN

## 2017-02-19 MED ORDER — RISPERIDONE 2 MG PO TABS
2.0000 mg | ORAL_TABLET | Freq: Every day | ORAL | Status: DC
  Filled 2017-02-19 (×2): qty 1

## 2017-02-19 MED ORDER — ALUM & MAG HYDROXIDE-SIMETH 200-200-20 MG/5ML PO SUSP: 30.0000 mL | ORAL | Status: DC | PRN

## 2017-02-19 NOTE — Tx Team (Signed)
Initial Treatment Plan 02/19/2017 2:48 AM Sarah [name] [id number]    PATIENT STRESSORS: Loss of "sister 1 year ago" Substance abuse   PATIENT STRENGTHS: Average or above average intelligence Capable of independent living Communication skills General fund of knowledge Motivation for treatment/growth Physical Health Supportive family/friends Work skills   PATIENT IDENTIFIED PROBLEMS: "Over come my depression"    Depression  Increased risk for suicide               DISCHARGE CRITERIA:  Ability to meet basic life and health needs Adequate post-discharge living arrangements Improved stabilization in mood, thinking, and/or behavior Medical problems require only outpatient monitoring Motivation to continue treatment in a less acute level of care Need for constant or close observation no longer present Reduction of life-threatening or endangering symptoms to within safe limits Safe-care adequate arrangements made Verbal commitment to aftercare and medication compliance  PRELIMINARY DISCHARGE PLAN: Outpatient therapy Participate in family therapy Placement in alternative living arrangements  PATIENT/FAMILY INVOLVEMENT: This treatment plan has been presented to and reviewed with the patient, Sarah [name], and/or family member.  The patient and family have been given the opportunity to ask questions and make suggestions.  Doran Heater, RN 02/19/2017, 2:48 AM

## 2017-02-19 NOTE — Plan of Care (Signed)
Problem: Safety: Goal: Periods of time without injury will increase Outcome: Progressing Pt. remains a low fall risk, denies SI/HI/AVH at this time, Q 15 checks in effect.

## 2017-02-19 NOTE — Social Work (Signed)
Referred to [facility] Transitional Care Team, is [place] resident.  Anne Cunningham, LCSW Lead Clinical Social Worker Phone:  [phone]  

## 2017-02-19 NOTE — Progress Notes (Signed)
Recreation Therapy Notes  Date: 02/19/17 Time: 0930 Location: 300 Hall Dayroom  Group Topic: Stress Management  Goal Area(s) Addresses:  Patient will verbalize importance of using healthy stress management.  Patient will identify positive emotions associated with healthy stress management.   Intervention: Stress Management  Activity :  Body Scan Meditation.  LRT introduced the stress management technique of meditation.  LRT played [name] meditation from the Calm app to allow patients the opportunity to scan and acknowledge the sensations and tension within the body.  Patients were to follow along as the meditation played to engage in the activity.  Education:  Stress Management, Discharge Planning.   Education Outcome: Acknowledges edcuation/In group clarification offered/Needs additional education  Clinical Observations/Feedback: Pt did not attend group.   Sarah [name], LRT/CTRS         Sarah [name] 02/19/2017 12:54 PM 

## 2017-02-19 NOTE — [facility] Group Notes (Signed)
[place] LCSW Group Therapy 02/19/2017 1:15 PM  Type of Therapy: Group Therapy- Emotion Regulation  Participation Level: Pt invited. Did not attend.   Georga Kaufmann, MSW, LCSWA 02/19/2017 4:46 PM

## 2017-02-19 NOTE — ED Notes (Signed)
David from [place] control called for update and has closed patient case.

## 2017-02-19 NOTE — [facility] Suicide Risk Assessment (Signed)
[facility] INPATIENT:  Family/Significant Other Suicide Prevention Education  Suicide Prevention Education:  Patient Refusal for Family/Significant Other Suicide Prevention Education: The patient Sarah [name] has refused to provide written consent for family/significant other to be provided Family/Significant Other Suicide Prevention Education during admission and/or prior to discharge.  Physician notified.  Pt declines family contact at this time. SPE discussed with the pt.  Georga Kaufmann, MSW, LCSWA  02/19/2017, 4:40 PM

## 2017-02-19 NOTE — H&P (Signed)
Psychiatric Admission Assessment Adult  Patient Identification: Sarah [name] MRN:  [id number]  Date of Evaluation:  02/19/2017  Chief Complaint: Worsening symptoms of depression triggering suicidal ideations.  Principal Diagnosis: Schizoaffective Disorder- currently depressed   Diagnosis:   Patient Active Problem List   Diagnosis Date Noted  . MDD (major depressive disorder), recurrent episode, severe ([place]) [F33.2] 02/19/2017  . Schizoaffective disorder, bipolar type ([place]) [F25.0] 01/06/2017  . 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 IIH (idiopathic intracranial hypertension) [G93.2] 01/24/2016  . Cannabis use disorder, moderate, dependence ([place]) [F12.20] 08/18/2015   History of Present Illness: This is an admission assessment for this 41 year old African-American female known to our unit from previous hospitalizations for mood stabilization treatments. Admitted to the [facility] this time with complaints of worsening symptoms of depression triggering suicidal ideations. During this assessment; Sarah [name] reports, "A cab took me to the [facility] yesterday. I had gone to [facility] because I was feeling like hurting myself. [facility] called a cab to take me to the ED. I have been depressed real bad x 1 week. Them, I became suicidal. I tried to kill myself yesterday. I took 20 (500 mg) tablets of Tylenol all at once. My depression worsened because I am homeless. I have been on the streets x 3 days. I was staying at my my mother in-law's home, then, my husband was admitted at the [facility] for drug detox 3 days ago. I was in this hospital in March of 2018. I have doing good until 3 days ago. This is my second suicide attempts. I need help".  Associated Signs/Symptoms: Depression Symptoms:  depressed mood, insomnia, hopelessness, suicidal thoughts with specific plan, suicidal attempt,  (Hypo) Manic Symptoms:  Denies and does not currently present with manic symptoms    Anxiety Symptoms:  Denies   Psychotic Symptoms: states she had been experiencing auditory hallucinations, telling her to harm self, over the last 3 days, but states they are now resolved .  PTSD Symptoms: Denies   Total Time spent with patient: 1 hour  Past Psychiatric History: Several prior psychiatric admissions, last time, March of 2018. She has been diagnosed with Bipolar Disorder and Schizoaffective Disorder in the past.  At the time was admitted for disorganized behaviors, was diagnosed with Schizoaffective Disorder, and was discharged on  Long actin IM Abilify, Lamictal, Trazodone.  She has history of suicidal attempt by overdosing in 2016. Denies history of self cutting, denies history of violence. States she has history of hallucinations, which occur only when feeling depressed .  Is the patient at risk to self? Yes.    Has the patient been a risk to self in the past 6 months? Yes.    Has the patient been a risk to self within the distant past? Yes.    Is the patient a risk to others? No.  Has the patient been a risk to others in the past 6 months? No.  Has the patient been a risk to others within the distant past? No.   Prior Inpatient Therapy: Yes, ([place] x numerous times).  Prior Outpatient Therapy: Follows up at [facility]   Alcohol Screening: 1. How often do you have a drink containing alcohol?: Never 9. Have you or someone else been injured as a result of your drinking?: No 10. Has a relative or friend or a doctor or another health worker been concerned about your drinking or suggested you cut down?: No Alcohol Use Disorder Identification Test Final Score (AUDIT):  0  Substance Abuse History in the last 12 months: Yes (THC)  Consequences of Substance Abuse: Denies.   Previous Psychotropic Medications: Yes (Abilify injectable).  Psychological Evaluations: No   Past Medical History:  Past Medical History:  Diagnosis Date  . Anemia   . Anxiety   . Depression   .  Elevated bilirubin    slight elevation at 1.3  . Hypertension   . Hypokalemia   . Intracranial hypertension   . Migraines     Past Surgical History:  Procedure Laterality Date  . NO PAST SURGERIES     Family History: Father died from MI, mother is alive, has three brothers, one sister died from complications of sickle cell disease. Family History  Problem Relation Age of Onset  . Schizophrenia Mother   . Hypertension Mother   . Anesthesia problems Neg Hx   . Malignant hyperthermia Neg Hx   . Pseudochol deficiency Neg Hx   . Hypotension Neg Hx   . Migraines Neg Hx    Family Psychiatric  History: States mother may have Bipolar Disorder, no suicides in family, mother has history of substance abuse, now in remission  Tobacco Screening: Have you used any form of tobacco in the last 30 days? (Cigarettes, Smokeless Tobacco, Cigars, and/or Pipes): Yes Tobacco use, Select all that apply: 5 or more cigarettes per day Are you interested in Tobacco Cessation Medications?: Yes, will notify MD for an order Counseled patient on smoking cessation including recognizing danger situations, developing coping skills and basic information about quitting provided: Yes  Social History: Married, no children, lives with husband, on disability, no legal issues.  History  Alcohol Use No    Comment: denied all drug/alcohol use     History  Drug Use  . Types: Marijuana    Comment: denied all drug/alcohol use    Additional Social History:  Allergies: PCN  Lab Results:  Results for orders placed or performed during the hospital encounter of 02/18/17 (from the past 48 hour(s))  CBG monitoring, ED     Status: Abnormal   Collection Time: 02/18/17  3:52 PM  Result Value Ref Range   Glucose-Capillary 61 (L) 65 - 99 mg/dL   Comment 1 Notify RN    Comment 2 Document in Chart   Comprehensive metabolic panel     Status: Abnormal   Collection Time: 02/18/17  4:13 PM  Result Value Ref Range   Sodium 139  135 - 145 mmol/L   Potassium 3.4 (L) 3.5 - 5.1 mmol/L   Chloride 107 101 - 111 mmol/L   CO2 25 22 - 32 mmol/L   Glucose, Bld 81 65 - 99 mg/dL   BUN 16 6 - 20 mg/dL   Creatinine, Ser 0.79 0.44 - 1.00 mg/dL   Calcium 8.6 (L) 8.9 - 10.3 mg/dL   Total Protein 7.2 6.5 - 8.1 g/dL   Albumin 3.7 3.5 - 5.0 g/dL   AST 15 15 - 41 U/L   ALT 10 (L) 14 - 54 U/L   Alkaline Phosphatase 65 38 - 126 U/L   Total Bilirubin 0.6 0.3 - 1.2 mg/dL   GFR calc non Af Amer >60 >60 mL/min   GFR calc Af Amer >60 >60 mL/min    Comment: (NOTE) The eGFR has been calculated using the CKD EPI equation. This calculation has not been validated in all clinical situations. eGFR's persistently <60 mL/min signify possible Chronic Kidney Disease.    Anion gap 7 5 - 15  Ethanol  Status: None   Collection Time: 02/18/17  4:13 PM  Result Value Ref Range   Alcohol, Ethyl (B) <5 <5 mg/dL    Comment:        LOWEST DETECTABLE LIMIT FOR SERUM ALCOHOL IS 5 mg/dL FOR MEDICAL PURPOSES ONLY   Salicylate level     Status: None   Collection Time: 02/18/17  4:13 PM  Result Value Ref Range   Salicylate Lvl <6.4 2.8 - 30.0 mg/dL  Acetaminophen level     Status: Abnormal   Collection Time: 02/18/17  4:13 PM  Result Value Ref Range   Acetaminophen (Tylenol), Serum <10 (L) 10 - 30 ug/mL    Comment:        THERAPEUTIC CONCENTRATIONS VARY SIGNIFICANTLY. A RANGE OF 10-30 ug/mL MAY BE AN EFFECTIVE CONCENTRATION FOR MANY PATIENTS. HOWEVER, SOME ARE BEST TREATED AT CONCENTRATIONS OUTSIDE THIS RANGE. ACETAMINOPHEN CONCENTRATIONS >150 ug/mL AT 4 HOURS AFTER INGESTION AND >50 ug/mL AT 12 HOURS AFTER INGESTION ARE OFTEN ASSOCIATED WITH TOXIC REACTIONS.   CBC with Differential     Status: Abnormal   Collection Time: 02/18/17  4:13 PM  Result Value Ref Range   WBC 8.2 4.0 - 10.5 K/uL   RBC 3.62 (L) 3.87 - 5.11 MIL/uL   Hemoglobin 9.6 (L) 12.0 - 15.0 g/dL   HCT 30.3 (L) 36.0 - 46.0 %   MCV 83.7 78.0 - 100.0 fL   MCH 26.5  26.0 - 34.0 pg   MCHC 31.7 30.0 - 36.0 g/dL   RDW 16.7 (H) 11.5 - 15.5 %   Platelets 436 (H) 150 - 400 K/uL   Neutrophils Relative % 49 %   Neutro Abs 4.0 1.7 - 7.7 K/uL   Lymphocytes Relative 39 %   Lymphs Abs 3.2 0.7 - 4.0 K/uL   Monocytes Relative 7 %   Monocytes Absolute 0.6 0.1 - 1.0 K/uL   Eosinophils Relative 5 %   Eosinophils Absolute 0.4 0.0 - 0.7 K/uL   Basophils Relative 0 %   Basophils Absolute 0.0 0.0 - 0.1 K/uL  Urinalysis, Routine w reflex microscopic     Status: Abnormal   Collection Time: 02/18/17  4:54 PM  Result Value Ref Range   Color, Urine YELLOW YELLOW   APPearance HAZY (A) CLEAR   Specific Gravity, Urine 1.033 (H) 1.005 - 1.030   pH 5.0 5.0 - 8.0   Glucose, UA NEGATIVE NEGATIVE mg/dL   Hgb urine dipstick LARGE (A) NEGATIVE   Bilirubin Urine NEGATIVE NEGATIVE   Ketones, ur 5 (A) NEGATIVE mg/dL   Protein, ur 30 (A) NEGATIVE mg/dL   Nitrite NEGATIVE NEGATIVE   Leukocytes, UA TRACE (A) NEGATIVE   RBC / HPF 0-5 0 - 5 RBC/hpf   WBC, UA 6-30 0 - 5 WBC/hpf   Bacteria, UA RARE (A) NONE SEEN   Squamous Epithelial / LPF 6-30 (A) NONE SEEN   Mucous PRESENT   Urine rapid drug screen (hosp performed)     Status: Abnormal   Collection Time: 02/18/17  4:54 PM  Result Value Ref Range   Opiates NONE DETECTED NONE DETECTED   Cocaine NONE DETECTED NONE DETECTED   Benzodiazepines NONE DETECTED NONE DETECTED   Amphetamines NONE DETECTED NONE DETECTED   Tetrahydrocannabinol POSITIVE (A) NONE DETECTED   Barbiturates NONE DETECTED NONE DETECTED    Comment:        DRUG SCREEN FOR MEDICAL PURPOSES ONLY.  IF CONFIRMATION IS NEEDED FOR ANY PURPOSE, NOTIFY LAB WITHIN 5 DAYS.  LOWEST DETECTABLE LIMITS FOR URINE DRUG SCREEN Drug Class       Cutoff (ng/mL) Amphetamine      1000 Barbiturate      200 Benzodiazepine   409 Tricyclics       811 Opiates          300 Cocaine          300 THC              50    Blood Alcohol level:  Lab Results  Component Value Date    ETH <5 02/18/2017   ETH <5 91/47/8295   Metabolic Disorder Labs:  Lab Results  Component Value Date   HGBA1C 5.4 01/04/2017   MPG 108 01/04/2017   MPG 120 08/19/2015   Lab Results  Component Value Date   PROLACTIN 18.7 01/04/2017   PROLACTIN 15.6 08/19/2015   Lab Results  Component Value Date   CHOL 179 01/04/2017   TRIG 158 (H) 01/04/2017   HDL 58 01/04/2017   CHOLHDL 3.1 01/04/2017   VLDL 32 01/04/2017   LDLCALC 89 01/04/2017   LDLCALC 72 08/19/2015   Current Medications: Current Facility-Administered Medications  Medication Dose Route Frequency Provider Last Rate Last Dose  . alum & mag hydroxide-simeth (MAALOX/MYLANTA) 200-200-20 MG/5ML suspension 30 mL  30 mL Oral Q4H PRN Patriciaann Clan E, PA-C      . amLODipine (NORVASC) tablet 5 mg  5 mg Oral Daily Lindell Spar I, NP   5 mg at 02/19/17 1215  . ARIPiprazole (ABILIFY) tablet 2 mg  2 mg Oral Daily Laverle Hobby, PA-C   2 mg at 02/19/17 0901  . hydrOXYzine (ATARAX/VISTARIL) tablet 25 mg  25 mg Oral Q6H PRN Patriciaann Clan E, PA-C      . magnesium hydroxide (MILK OF MAGNESIA) suspension 30 mL  30 mL Oral Daily PRN Patriciaann Clan E, PA-C      . nicotine (NICODERM CQ - dosed in mg/24 hours) patch 21 mg  21 mg Transdermal Daily Correll Denbow, [name], MD   21 mg at 02/19/17 0902  . risperiDONE (RISPERDAL) tablet 2 mg  2 mg Oral QHS Simon, Spencer E, PA-C      . sertraline (ZOLOFT) tablet 100 mg  100 mg Oral QHS Simon, Spencer E, PA-C      . traZODone (DESYREL) tablet 50 mg  50 mg Oral QHS PRN Laverle Hobby, PA-C       PTA Medications: Prescriptions Prior to Admission  Medication Sig Dispense Refill Last Dose  . acetaminophen (TYLENOL) 500 MG tablet Take 500 mg by mouth once.   02/18/2017 at Unknown time  . ARIPiprazole (ABILIFY) 2 MG tablet Take 1 tablet (2 mg total) by mouth daily. 30 tablet 0 02/18/2017 at Unknown time  . hydrOXYzine (ATARAX/VISTARIL) 25 MG tablet Take 1 tablet (25 mg total) by mouth every 6 (six) hours  as needed for anxiety. 30 tablet 0 02/18/2017 at Unknown time  . risperiDONE (RISPERDAL) 2 MG tablet Take 2 mg by mouth at bedtime.    02/18/2017 at Unknown time  . sertraline (ZOLOFT) 100 MG tablet Take 100 mg by mouth at bedtime.    02/18/2017 at Unknown time  . traZODone (DESYREL) 50 MG tablet Take 1 tablet (50 mg total) by mouth at bedtime and may repeat dose one time if needed. (Patient taking differently: Take 50 mg by mouth at bedtime as needed for sleep. ) 30 tablet 0 Past Week at Unknown time  . sertraline (ZOLOFT) 25  MG tablet Take 1 tablet (25 mg total) by mouth daily. (Patient not taking: Reported on 02/18/2017) 30 tablet 0 Not Taking at Unknown time   Musculoskeletal: Strength & Muscle Tone: within normal limits Gait & Station: normal Patient leans: N/A  Psychiatric Specialty Exam: Physical Exam  Constitutional: She is oriented to person, place, and time. She appears well-developed.  Obese.  HENT:  Head: Normocephalic.  Eyes: Pupils are equal, round, and reactive to light.  Neck: Normal range of motion.  Cardiovascular: Normal rate.   Respiratory: Effort normal.  GI: Soft.  Genitourinary:  Genitourinary Comments: Deferred  Musculoskeletal: Normal range of motion.  Neurological: She is alert and oriented to person, place, and time.  Skin: Skin is warm.    Review of Systems  Constitutional: Negative.   HENT: Negative.   Eyes: Negative.   Respiratory: Negative.   Cardiovascular: Negative.   Gastrointestinal: Negative.   Genitourinary: Negative.   Musculoskeletal: Negative.   Skin: Negative.   Neurological: Negative for seizures.  Endo/Heme/Allergies: Negative.   Psychiatric/Behavioral: Positive for depression, substance abuse (UDS (+) for THC.) and suicidal ideas. Negative for hallucinations and memory loss. The patient is nervous/anxious and has insomnia.   All other systems reviewed and are negative.   Blood pressure (!) 155/101, pulse (!) 55, temperature 97.7 F  (36.5 C), temperature source Oral, resp. rate 16, height 5' 6.5" (1.689 m), weight 109.3 kg (241 lb).Body mass index is 38.32 kg/m.  General Appearance: Disheveled  Eye Contact:  Fair  Speech:  Clear and Coherent and Slow  Volume:  Decreased  Mood:  Anxious, Depressed and Hopeless  Affect:  Depressed and Flat  Thought Process:  Linear and Descriptions of Associations: Intact  Orientation:  Other:  Fully alert and attentive   Thought Content:  Denies hallucinations at this time and does not appear internally preoccupied, no delusions expressed   Suicidal Thoughts:  Denies  any suicidal or self injurious ideations at this time and contracts for safety on unit   Homicidal Thoughts:  No, denies any homicidal ideations and also specifically denies any HI or violent ideations towards husband   Memory:  Recent & remote grossly intact   Judgement:  Fair  Insight:  Fair  Psychomotor Activity:  Decreased  Concentration:  Concentration: Good and Attention Span: Good  Recall:  Good  Fund of Knowledge:  Good  Language:  Fair  Akathisia:  Negative  Handed:  Right  AIMS (if indicated):     Assets:  Desire for Improvement Resilience  ADL's:  Intact  Cognition:  WNL  Sleep:     Treatment Plan/Recommendations: 1. Admit for crisis management and stabilization, estimated length of stay 3-5 days.  2. Medication management to reduce current symptoms to base line and improve the patient's overall level of functioning: See [facility] for the plan of care.  3. Treat health problems as indicated: .  4. Develop treatment plan to decrease risk of relapse upon discharge and the need for readmission.  5. Psycho-social education regarding relapse prevention and self care.  6. Health care follow up as needed for medical problems.  7. Review, reconcile, and reinstate any pertinent home medications for other health issues where appropriate. 8. Call for consults with hospitalist for any additional specialty patient care  services as needed.  Observation Level/Precautions:  15 minute checks  Laboratory:  Per ED, UDS positive for [facility]  Psychotherapy: Milieu, group therapy   Medications: See MA.    Consultations:  As needed   Discharge  Concerns: Mood stability.  Estimated LOS: 3 - 5 days   Other: Admit to the 400 hall.   Physician Treatment Plan for Primary Diagnosis:  Schizoaffective Disorder, Depressed   Long Term Goal(s): Improvement in symptoms so as ready for discharge  Short Term Goals: Ability to identify changes in lifestyle to reduce recurrence of condition will improve, Ability to verbalize feelings will improve, Ability to disclose and discuss suicidal ideas and Ability to demonstrate self-control will improve  Physician Treatment Plan for Secondary Diagnosis: Active Problems:   MDD (major depressive disorder), recurrent episode, severe ([place])  Long Term Goal(s): Improvement in symptoms so as ready for discharge  Short Term Goals: Ability to identify and develop effective coping behaviors will improve, Compliance with prescribed medications will improve and Ability to identify triggers associated with substance abuse/mental health issues will improve  I certify that inpatient services furnished can reasonably be expected to improve the patient's condition.    Encarnacion Slates, NP, PMHNP, FNP-BC. 5/16/20181:30 PM   I have reviewed case with NP and have met with patient  Agree with NP  assessment  41 year old female, presented to ED on 5/15 after suicidal attempt by overdosing on acetaminophen. She states she had been feeling depressed, and had been experiencing auditory hallucinations. She has been facing significant stressors recently , mainly being kicked out of the home by her mother in law, facing homelessness. She states that suicide attempt was impulsive and unplanned . She has a history of psychiatric illness and in the past has been diagnosed with Schizoaffective Disorder. She had been  admitted to our unit in March 2018 for  depression, suicidal ideations , and hallucinations, triggered at the time by family stressors. She was stabilized and discharged on Abilify 2 mgrs a day in addition to Abilify Susr IM depot, and Zoloft 25 mgrs daily. Patient states she last received depot antipsychotic about about three to four weeks ago.  Dx- Schizoaffective Disorder, Suicide attempt by overdose Plan - inpatient admission  At this time continue Abilify 5 mgrs QDAY , Zoloft 100 mgrs QDAY for depression, D/C Risperidone to minimize concomitant use of two different antipsychotics. Staff will attempt to clarify when last Abilify Susr IM injection was given, in order to determine next dose schedule

## 2017-02-19 NOTE — [facility] Suicide Risk Assessment (Signed)
[facility] Admission Suicide Risk Assessment   Nursing information obtained from:  Patient Demographic factors:  Unemployed Current Mental Status:  NA Loss Factors:  Loss of significant relationship Historical Factors:  Prior suicide attempts, Family history of mental illness or substance abuse Risk Reduction Factors:  NA  Total Time spent with patient: 45 minutes Principal Problem:  Schizoaffective Disorder Diagnosis:   Patient Active Problem List   Diagnosis Date Noted  . MDD (major depressive disorder), recurrent episode, severe ([place]) [F33.2] 02/19/2017  . Schizoaffective disorder, bipolar type ([place]) [F25.0] 01/06/2017  . 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 IIH (idiopathic intracranial hypertension) [G93.2] 01/24/2016  . Cannabis use disorder, moderate, dependence ([place]) [F12.20] 08/18/2015    Continued Clinical Symptoms:  Alcohol Use Disorder Identification Test Final Score (AUDIT): 0 The "Alcohol Use Disorders Identification Test", Guidelines for Use in Primary Care, Second Edition.  World Pharmacologist [facility], [facility]). Score between 0-7:  no or low risk or alcohol related problems. Score between 8-15:  moderate risk of alcohol related problems. Score between 16-19:  high risk of alcohol related problems. Score 20 or above:  warrants further diagnostic evaluation for alcohol dependence and treatment.   CLINICAL FACTORS:  41 year old female, presented to ED on 5/15 after suicidal attempt by overdosing on acetaminophen. She states she had been feeling depressed, and had been experiencing auditory hallucinations. She has been facing significant stressors recently , mainly being kicked out of the home by her mother in law, facing homelessness. She states that suicide attempt was impulsive and unplanned . She has a history of psychiatric illness and in the past has been diagnosed with Schizoaffective Disorder. She had been admitted to our unit in March 2018 for  depression,  suicidal ideations , and hallucinations, triggered at the time by family stressors. She was stabilized and discharged on Abilify 2 mgrs a day in addition to Abilify Susr IM depot, and Zoloft 25 mgrs daily. Patient states she last received depot antipsychotic about about three to four weeks ago.  Dx- Schizoaffective Disorder, Suicide attempt by overdose Plan - inpatient admission  At this time continue Abilify 5 mgrs QDAY , Zoloft 100 mgrs QDAY for depression, D/C Risperidone to minimize concomitant use of two different antipsychotics. Staff will attempt to clarify when last Abilify Susr IM injection was given, in order to determine next dose schedule    Musculoskeletal: Strength & Muscle Tone: within normal limits Gait & Station: normal Patient leans: N/A  Psychiatric Specialty Exam: Physical Exam  ROS denies headache, denies chest pain, denies shortness of breath  Blood pressure 122/68, pulse (!) 49, temperature 97.7 F (36.5 C), temperature source Oral, resp. rate 16, height 5' 6.5" (1.689 m), weight 109.3 kg (241 lb).Body mass index is 38.32 kg/m.  General Appearance: Fairly Groomed  Eye Contact:  Good  Speech:  slow  Volume:  Decreased  Mood:  states she is feeling better now, and minimizes depression at this time  Affect:  slightly constricted, but smiles at times appropriately   Thought Process:  Linear and Descriptions of Associations: Intact  Orientation:  Other:  fully alert and attentive  Thought Content:  reports recent hallucinations, but denies any at this time and does not appear internally preoccupied at this time , no delusions expressed   Suicidal Thoughts:  No currently denies suicidal or self injurious ideations, contracts for safety on unit   Homicidal Thoughts:  No denies   Memory:  recent and remote grossly intact   Judgement:  Fair  Insight:  Fair  Psychomotor Activity:  Decreased  Concentration:  Concentration: Good and Attention Span: Good  Recall:  Good   Fund of Knowledge:  Good  Language:  Good  Akathisia:  Negative  Handed:  Right  AIMS (if indicated):     Assets:  Desire for Improvement Resilience  ADL's:  Intact  Cognition:  WNL  Sleep:  Number of Hours: 2.25      COGNITIVE FEATURES THAT CONTRIBUTE TO RISK:  Closed-mindedness and Loss of executive function    SUICIDE RISK:   Moderate:  Frequent suicidal ideation with limited intensity, and duration, some specificity in terms of plans, no associated intent, good self-control, limited dysphoria/symptomatology, some risk factors present, and identifiable protective factors, including available and accessible social support.  PLAN OF CARE: Patient will be admitted to inpatient psychiatric unit for stabilization and safety. Will provide and encourage milieu participation. Provide medication management and maked adjustments as needed.  Will follow daily.    I certify that inpatient services furnished can reasonably be expected to improve the patient's condition.   Jenne Campus, MD 02/19/2017, 6:22 PM

## 2017-02-19 NOTE — ED Notes (Signed)
Pt transported to [facility] by [facility] transportation service for continuation of specialized care. Belongings given to driver after patient signed for them. Pt left in no acute distress. 

## 2017-02-19 NOTE — Progress Notes (Signed)
Patient ID: Sarah [name], female   DOB: 28-May-1976, 41 y.o.   MRN: [id number]  DAR: Pt. Denies SI/HI and A/V Hallucinations. Patient appears flat in affect and preoccupied. She does not outwardly appear to be responding to internal stimuli but is electively mute, minimal, and isolative. She reports sleep is good, appetite is good, energy level is normal, and concentration is good. She rates depression 8/10, hopelessness 6/10, and anxiety 0/10. She only answers with yes or no but otherwise does not speak to this Probation officer. Patient does not report any pain or discomfort at this time. Support and encouragement provided to the patient. Patient does not appear to be interacting with her peers. Q15 minute checks are maintained for safety.

## 2017-02-19 NOTE — Progress Notes (Signed)
Adult Psychoeducational Group Note  Date:  02/19/2017 Time:  9:32 PM  Group Topic/Focus:  Wrap-Up Group:   The focus of this group is to help patients review their daily goal of treatment and discuss progress on daily workbooks.  Participation Level:  Active  Participation Quality:  Appropriate and Attentive  Affect:  Appropriate  Cognitive:  Appropriate  Insight: Appropriate  Engagement in Group:  Engaged  Modes of Intervention:  Discussion  Additional Comments:  Pt stated her goal was to work on her depression. Pt identified 2 coping skills, 1) coloring, 2) relaxation techniques.  Clint Bolder 02/19/2017, 9:32 PM

## 2017-02-19 NOTE — Tx Team (Signed)
Interdisciplinary Treatment and Diagnostic Plan Update 02/19/2017 Time of Session: 9:30am  Sarah [name]  MRN: [id number]  Principal Diagnosis: Schizoaffective Disorder- currently depressed   Secondary Diagnoses: Active Problems:   MDD (major depressive disorder), recurrent episode, severe (HCC)   Current Medications:  Current Facility-Administered Medications  Medication Dose Route Frequency Provider Last Rate Last Dose  . alum & mag hydroxide-simeth (MAALOX/MYLANTA) 200-200-20 MG/5ML suspension 30 mL  30 mL Oral Q4H PRN Patriciaann Clan E, PA-C      . ARIPiprazole (ABILIFY) tablet 2 mg  2 mg Oral Daily Patriciaann Clan E, PA-C   2 mg at 02/19/17 0901  . hydrOXYzine (ATARAX/VISTARIL) tablet 25 mg  25 mg Oral Q6H PRN Patriciaann Clan E, PA-C      . magnesium hydroxide (MILK OF MAGNESIA) suspension 30 mL  30 mL Oral Daily PRN Patriciaann Clan E, PA-C      . nicotine (NICODERM CQ - dosed in mg/24 hours) patch 21 mg  21 mg Transdermal Daily [name], [name], MD   21 mg at 02/19/17 0902  . risperiDONE (RISPERDAL) tablet 2 mg  2 mg Oral QHS Simon, Spencer E, PA-C      . sertraline (ZOLOFT) tablet 100 mg  100 mg Oral QHS Simon, Spencer E, PA-C      . traZODone (DESYREL) tablet 50 mg  50 mg Oral QHS PRN Laverle Hobby, PA-C        PTA Medications: Prescriptions Prior to Admission  Medication Sig Dispense Refill Last Dose  . acetaminophen (TYLENOL) 500 MG tablet Take 500 mg by mouth once.   02/18/2017 at Unknown time  . ARIPiprazole (ABILIFY) 2 MG tablet Take 1 tablet (2 mg total) by mouth daily. 30 tablet 0 02/18/2017 at Unknown time  . hydrOXYzine (ATARAX/VISTARIL) 25 MG tablet Take 1 tablet (25 mg total) by mouth every 6 (six) hours as needed for anxiety. 30 tablet 0 02/18/2017 at Unknown time  . risperiDONE (RISPERDAL) 2 MG tablet Take 2 mg by mouth at bedtime.    02/18/2017 at Unknown time  . sertraline (ZOLOFT) 100 MG tablet Take 100 mg by mouth at bedtime.    02/18/2017 at Unknown time  .  traZODone (DESYREL) 50 MG tablet Take 1 tablet (50 mg total) by mouth at bedtime and may repeat dose one time if needed. (Patient taking differently: Take 50 mg by mouth at bedtime as needed for sleep. ) 30 tablet 0 Past Week at Unknown time  . sertraline (ZOLOFT) 25 MG tablet Take 1 tablet (25 mg total) by mouth daily. (Patient not taking: Reported on 02/18/2017) 30 tablet 0 Not Taking at Unknown time    Treatment Modalities: Medication Management, Group therapy, Case management,  1 to 1 session with clinician, Psychoeducation, Recreational therapy.  Patient Stressors: Loss of "sister 1 year ago" Substance abuse Patient Strengths: Average or above average intelligence Capable of independent living Communication skills General fund of knowledge Motivation for treatment/growth Physical Health Supportive family/friends Work Artist for Primary Diagnosis: Schizoaffective Disorder- currently depressed  Long Term Goal(s): Improvement in symptoms so as ready for discharge Short Term Goals: Ability to identify changes in lifestyle to reduce recurrence of condition will improve Ability to verbalize feelings will improve Ability to disclose and discuss suicidal ideas Ability to demonstrate self-control will improve Ability to identify and develop effective coping behaviors will improve Compliance with prescribed medications will improve Ability to identify triggers associated with substance abuse/mental health issues will improve  Medication Management: Evaluate  patient's response, side effects, and tolerance of medication regimen.  Therapeutic Interventions: 1 to 1 sessions, Unit Group sessions and Medication administration.  Evaluation of Outcomes: Not Met  Physician Treatment Plan for Secondary Diagnosis: Active Problems:   MDD (major depressive disorder), recurrent episode, severe ([place])  Long Term Goal(s): Improvement in symptoms so as ready for  discharge  Short Term Goals: Ability to identify changes in lifestyle to reduce recurrence of condition will improve Ability to verbalize feelings will improve Ability to disclose and discuss suicidal ideas Ability to demonstrate self-control will improve Ability to identify and develop effective coping behaviors will improve Compliance with prescribed medications will improve Ability to identify triggers associated with substance abuse/mental health issues will improve  Medication Management: Evaluate patient's response, side effects, and tolerance of medication regimen.  Therapeutic Interventions: 1 to 1 sessions, Unit Group sessions and Medication administration.  Evaluation of Outcomes: Not Met  RN Treatment Plan for Primary Diagnosis: Schizoaffective Disorder- currently depressed  Long Term Goal(s): Knowledge of disease and therapeutic regimen to maintain health will improve  Short Term Goals: Ability to verbalize feelings will improve and Compliance with prescribed medications will improve  Medication Management: RN will administer medications as ordered by provider, will assess and evaluate patient's response and provide education to patient for prescribed medication. RN will report any adverse and/or side effects to prescribing provider.  Therapeutic Interventions: 1 on 1 counseling sessions, Psychoeducation, Medication administration, Evaluate responses to treatment, Monitor vital signs and CBGs as ordered, Perform/monitor CIWA, COWS, AIMS and Fall Risk screenings as ordered, Perform wound care treatments as ordered.  Evaluation of Outcomes: Not Met  LCSW Treatment Plan for Primary Diagnosis: Schizoaffective Disorder- currently depressed  Long Term Goal(s): Safe transition to appropriate next level of care at discharge, Engage patient in therapeutic group addressing interpersonal concerns. Short Term Goals: Engage patient in aftercare planning with referrals and resources,  Increase ability to appropriately verbalize feelings, Identify triggers associated with mental health/substance abuse issues and Increase skills for wellness and recovery  Therapeutic Interventions: Assess for all discharge needs, 1 to 1 time with Social worker, Explore available resources and support systems, Assess for adequacy in community support network, Educate family and significant other(s) on suicide prevention, Complete Psychosocial Assessment, Interpersonal group therapy.  Evaluation of Outcomes: Not Met  Progress in Treatment: Attending groups: Pt is new to milieu, continuing to assess  Participating in groups: Pt is new to milieu, continuing to assess  Taking medication as prescribed: Yes, MD continues to assess for medication changes as needed Toleration medication: Yes, no side effects reported at this time Family/Significant other contact made: No, pt declined contact. Patient understands diagnosis: Continuing to assess Discussing patient identified problems/goals with staff: Yes Medical problems stabilized or resolved: Yes Denies suicidal/homicidal ideation: No, pt recently admitted for SI Issues/concerns per patient self-inventory: None Other: N/A  New problem(s) identified: None identified at this time.   New Short Term/Long Term Goal(s): None identified at this time.   Discharge Plan or Barriers: CSW still assessing for an appropriate plan.  Reason for Continuation of Hospitalization:  Depression Hallucinations Medication stabilization Suicidal ideation  Estimated Length of Stay: 3-5 days  Attendees: Patient: 02/19/2017 9:45 AM  Physician: Dr. Parke Poisson 02/19/2017 9:45 AM  Nursing: Rise Paganini RN; [place], RN 02/19/2017 9:45 AM  RN Care Manager: Lars Pinks, RN 02/19/2017 9:45 AM  Social Worker: Matthew Saras, [place] 02/19/2017 9:45 AM  Recreational Therapist:  02/19/2017 9:45 AM  Other: Lindell Spar, NP 02/19/2017 9:45 AM  Other:  02/19/2017 9:45 AM  Other: 02/19/2017  9:45 AM   Scribe for Treatment Team: Georga Kaufmann, MSW,LCSWA 02/19/2017 9:45 AM

## 2017-02-19 NOTE — Progress Notes (Signed)
  DATA ACTION RESPONSE  Objective- Pt. is visible in room, seen laying in bed with eyes closed. Presents with a depressed/sad affect and mood. Pt. appears isolative/withdrawn to room. Minimal interaction. No c/o. Zoloft started at bedtime.  Subjective- Denies having any SI/HI/AVH/Pain at this time. Pt. states " I am okay".  Is cooperative and remain safe on the unit.  1:1 interaction in private to establish rapport. Encouragement, education, & support given from staff.   Safety maintained with Q 15 checks. Continues to follow treatment plan and will monitor closely. No additonal questions/concerns noted.
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
## 2017-02-19 NOTE — ED Notes (Signed)
Awaiting MD portion of EMTALA

## 2017-02-20 MED ORDER — ARIPIPRAZOLE 10 MG PO TABS
10.0000 mg | ORAL_TABLET | Freq: Every day | ORAL | Status: DC
  Administered 2017-02-21 – 2017-02-24 (×4): 10 mg via ORAL
  Filled 2017-02-20 (×5): qty 1

## 2017-02-20 MED ORDER — AMLODIPINE BESYLATE 10 MG PO TABS
10.0000 mg | ORAL_TABLET | Freq: Every day | ORAL | Status: DC
  Administered 2017-02-21 – 2017-02-24 (×4): 10 mg via ORAL
  Filled 2017-02-20 (×5): qty 1

## 2017-02-20 NOTE — Plan of Care (Signed)
Problem: Activity: Goal: Interest or engagement in leisure activities will improve Outcome: Progressing Pt attended evening karaoke group tonight and reported she had fun.

## 2017-02-20 NOTE — Progress Notes (Signed)
Patient ID: Sarah [name], female   DOB: 12-31-1975, 41 y.o.   MRN: [id number]  DAR: Pt. Denies SI/HI and A/V Hallucinations. She reports sleep is good, appetite is good, energy level is normal, and concentration is good. She rates depression 6/10, hopelessness 0/10, and anxiety 0/10. Patient does not report any pain or discomfort at this time. Support and encouragement provided to the patient. Scheduled medications administered to patient per physician's orders. Patient is minimal and isolative throughout most of the day. Her affect and mood is depressed. She is seen smiling at times but it may be patient possibly responding to internal stimuli. Q15 minute checks are maintained for safety.

## 2017-02-20 NOTE — Progress Notes (Signed)
D: Pt was in her room upon initial approach.  Pt presents with depressed affect and mood.  She forwards [place] information to Probation officer.  She reports her day was "good" and her goal was to "go to group."  Pt attended evening karaoke group and reported it was "fun."  Pt denies SI/HI, denies hallucinations, denies pain.  Pt has been visible in milieu interacting with peers and staff cautiously.  A: Introduced self to pt.  Actively listened to pt and offered support and encouragement. Medication administered per order.  Q15 minute safety checks maintained.  R: Pt is safe on the unit.  Pt is compliant with medication.  Pt verbally contracts for safety.  Will continue to monitor and assess.

## 2017-02-20 NOTE — BHH Group Notes (Signed)
[place] LCSW Group Therapy 02/20/2017 1:15pm  Type of Therapy: Group Therapy- Balance in Life  Participation Level: Pt invited. Did not attend.   Georga Kaufmann, MSW, LCSWA 02/20/2017 3:30 PM

## 2017-02-20 NOTE — BHH Group Notes (Addendum)
[place] Group Notes: Leisure and Lifestyle Changes  Date:  02/20/2017  Time:  3:56 PM  Type of Therapy:  Psychoeducational Skills  Participation Level: Attentive   Participation Quality:  Minimal  Affect: Blunted  Cognitive:  Appropriate  Insight:  Limited  Engagement in Group:  Minimal  Modes of Intervention:  Discussion  Summary of Progress/Problems: Patient was invited to group and attended but was minimal. She reported her goal for today is to attend groups.

## 2017-02-20 NOTE — Progress Notes (Signed)
[facility] MD Progress Note  02/20/2017 10:50 AM Sarah [name]  MRN:  [id number] Subjective:  Patient states " I feel OK". At this time denies medication side effects. Objective : I have discussed case with treatment team and have met with patient . She presents calm, pleasant on approach, with a flat affect, although smiles briefly at times. Speech is limited and answers most questions with short sentences /mono-syllables. Her major stressor at this time is homelessness, after being kicked out of her mother in [place] recently. States her husband is now in another psychiatric inpatient unit, and that after she and him are discharged they are going to " live with a friend". Denies medication side effects. Denies hallucinations . No disruptive or agitated behaviors on unit, going to some groups, but interaction with peers is limited . Principal Problem:  Schizoaffective Disorder- Depressed  Diagnosis:   Patient Active Problem List   Diagnosis Date Noted  . MDD (major depressive disorder), recurrent episode, severe ([place]) [F33.2] 02/19/2017  . Schizoaffective disorder, bipolar type ([place]) [F25.0] 01/06/2017  . 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 IIH (idiopathic intracranial hypertension) [G93.2] 01/24/2016  . Cannabis use disorder, moderate, dependence ([place]) [F12.20] 08/18/2015  . Schizoaffective disorder, depressive type ([place]) [F25.1] 01/15/2012    Class: Acute   Total Time spent with patient: 20 minutes  Past Medical History:  Past Medical History:  Diagnosis Date  . Anemia   . Anxiety   . Depression   . Elevated bilirubin    slight elevation at 1.3  . Hypertension   . Hypokalemia   . Intracranial hypertension   . Migraines     Past Surgical History:  Procedure Laterality Date  . NO PAST SURGERIES     Family History:  Family History  Problem Relation Age of Onset  . Schizophrenia Mother   . Hypertension Mother   . Anesthesia problems Neg Hx   . Malignant  hyperthermia Neg Hx   . Pseudochol deficiency Neg Hx   . Hypotension Neg Hx   . Migraines Neg Hx    Social History:  History  Alcohol Use No    Comment: denied all drug/alcohol use     History  Drug Use  . Types: Marijuana    Comment: denied all drug/alcohol use    Social History   Social History  . Marital status: Single    Spouse name: N/A  . Number of children: 0  . Years of education: 12   Occupational History  . Unemployed    Social History Main Topics  . Smoking status: Current Every Day Smoker    Packs/day: 1.00    Years: 15.00    Types: Cigarettes    Last attempt to quit: 01/02/2017  . Smokeless tobacco: Never Used     Comment: smoked since 41 yrs old  . Alcohol use No     Comment: denied all drug/alcohol use  . Drug use: Yes    Types: Marijuana     Comment: denied all drug/alcohol use  . Sexual activity: Yes    Birth control/ protection: None   Other Topics Concern  . None   Social History Narrative   Lives with fiance, Erlene Quan   Caffeine use: Soda: 1 soda daily   No coffee   No tea   Additional Social History:   Sleep: improved  Appetite:  Good  Current Medications: Current Facility-Administered Medications  Medication Dose Route Frequency Provider Last Rate Last Dose  . alum & mag  hydroxide-simeth (MAALOX/MYLANTA) 200-200-20 MG/5ML suspension 30 mL  30 mL Oral Q4H PRN Patriciaann Clan E, PA-C      . amLODipine (NORVASC) tablet 5 mg  5 mg Oral Daily Lindell Spar I, NP   5 mg at 02/20/17 0849  . ARIPiprazole (ABILIFY) tablet 5 mg  5 mg Oral Daily , [name], MD   5 mg at 02/20/17 0849  . hydrOXYzine (ATARAX/VISTARIL) tablet 25 mg  25 mg Oral Q6H PRN Patriciaann Clan E, PA-C      . magnesium hydroxide (MILK OF MAGNESIA) suspension 30 mL  30 mL Oral Daily PRN Patriciaann Clan E, PA-C      . nicotine (NICODERM CQ - dosed in mg/24 hours) patch 21 mg  21 mg Transdermal Daily , [name], MD   21 mg at 02/20/17 0850  . sertraline (ZOLOFT)  tablet 100 mg  100 mg Oral QHS Patriciaann Clan E, PA-C   100 mg at 02/19/17 2154  . traZODone (DESYREL) tablet 50 mg  50 mg Oral QHS PRN Laverle Hobby, PA-C        Lab Results:  Results for orders placed or performed during the hospital encounter of 02/18/17 (from the past 48 hour(s))  CBG monitoring, ED     Status: Abnormal   Collection Time: 02/18/17  3:52 PM  Result Value Ref Range   Glucose-Capillary 61 (L) 65 - 99 mg/dL   Comment 1 Notify RN    Comment 2 Document in Chart   Comprehensive metabolic panel     Status: Abnormal   Collection Time: 02/18/17  4:13 PM  Result Value Ref Range   Sodium 139 135 - 145 mmol/L   Potassium 3.4 (L) 3.5 - 5.1 mmol/L   Chloride 107 101 - 111 mmol/L   CO2 25 22 - 32 mmol/L   Glucose, Bld 81 65 - 99 mg/dL   BUN 16 6 - 20 mg/dL   Creatinine, Ser 0.79 0.44 - 1.00 mg/dL   Calcium 8.6 (L) 8.9 - 10.3 mg/dL   Total Protein 7.2 6.5 - 8.1 g/dL   Albumin 3.7 3.5 - 5.0 g/dL   AST 15 15 - 41 U/L   ALT 10 (L) 14 - 54 U/L   Alkaline Phosphatase 65 38 - 126 U/L   Total Bilirubin 0.6 0.3 - 1.2 mg/dL   GFR calc non Af Amer >60 >60 mL/min   GFR calc Af Amer >60 >60 mL/min    Comment: (NOTE) The eGFR has been calculated using the CKD EPI equation. This calculation has not been validated in all clinical situations. eGFR's persistently <60 mL/min signify possible Chronic Kidney Disease.    Anion gap 7 5 - 15  Ethanol     Status: None   Collection Time: 02/18/17  4:13 PM  Result Value Ref Range   Alcohol, Ethyl (B) <5 <5 mg/dL    Comment:        LOWEST DETECTABLE LIMIT FOR SERUM ALCOHOL IS 5 mg/dL FOR MEDICAL PURPOSES ONLY   Salicylate level     Status: None   Collection Time: 02/18/17  4:13 PM  Result Value Ref Range   Salicylate Lvl <0.1 2.8 - 30.0 mg/dL  Acetaminophen level     Status: Abnormal   Collection Time: 02/18/17  4:13 PM  Result Value Ref Range   Acetaminophen (Tylenol), Serum <10 (L) 10 - 30 ug/mL    Comment:        THERAPEUTIC  CONCENTRATIONS VARY SIGNIFICANTLY. A RANGE OF 10-30 ug/mL  MAY BE AN EFFECTIVE CONCENTRATION FOR MANY PATIENTS. HOWEVER, SOME ARE BEST TREATED AT CONCENTRATIONS OUTSIDE THIS RANGE. ACETAMINOPHEN CONCENTRATIONS >150 ug/mL AT 4 HOURS AFTER INGESTION AND >50 ug/mL AT 12 HOURS AFTER INGESTION ARE OFTEN ASSOCIATED WITH TOXIC REACTIONS.   CBC with Differential     Status: Abnormal   Collection Time: 02/18/17  4:13 PM  Result Value Ref Range   WBC 8.2 4.0 - 10.5 K/uL   RBC 3.62 (L) 3.87 - 5.11 MIL/uL   Hemoglobin 9.6 (L) 12.0 - 15.0 g/dL   HCT 30.3 (L) 36.0 - 46.0 %   MCV 83.7 78.0 - 100.0 fL   MCH 26.5 26.0 - 34.0 pg   MCHC 31.7 30.0 - 36.0 g/dL   RDW 16.7 (H) 11.5 - 15.5 %   Platelets 436 (H) 150 - 400 K/uL   Neutrophils Relative % 49 %   Neutro Abs 4.0 1.7 - 7.7 K/uL   Lymphocytes Relative 39 %   Lymphs Abs 3.2 0.7 - 4.0 K/uL   Monocytes Relative 7 %   Monocytes Absolute 0.6 0.1 - 1.0 K/uL   Eosinophils Relative 5 %   Eosinophils Absolute 0.4 0.0 - 0.7 K/uL   Basophils Relative 0 %   Basophils Absolute 0.0 0.0 - 0.1 K/uL  Urinalysis, Routine w reflex microscopic     Status: Abnormal   Collection Time: 02/18/17  4:54 PM  Result Value Ref Range   Color, Urine YELLOW YELLOW   APPearance HAZY (A) CLEAR   Specific Gravity, Urine 1.033 (H) 1.005 - 1.030   pH 5.0 5.0 - 8.0   Glucose, UA NEGATIVE NEGATIVE mg/dL   Hgb urine dipstick LARGE (A) NEGATIVE   Bilirubin Urine NEGATIVE NEGATIVE   Ketones, ur 5 (A) NEGATIVE mg/dL   Protein, ur 30 (A) NEGATIVE mg/dL   Nitrite NEGATIVE NEGATIVE   Leukocytes, UA TRACE (A) NEGATIVE   RBC / HPF 0-5 0 - 5 RBC/hpf   WBC, UA 6-30 0 - 5 WBC/hpf   Bacteria, UA RARE (A) NONE SEEN   Squamous Epithelial / LPF 6-30 (A) NONE SEEN   Mucous PRESENT   Urine rapid drug screen (hosp performed)     Status: Abnormal   Collection Time: 02/18/17  4:54 PM  Result Value Ref Range   Opiates NONE DETECTED NONE DETECTED   Cocaine NONE DETECTED NONE DETECTED    Benzodiazepines NONE DETECTED NONE DETECTED   Amphetamines NONE DETECTED NONE DETECTED   Tetrahydrocannabinol POSITIVE (A) NONE DETECTED   Barbiturates NONE DETECTED NONE DETECTED    Comment:        DRUG SCREEN FOR MEDICAL PURPOSES ONLY.  IF CONFIRMATION IS NEEDED FOR ANY PURPOSE, NOTIFY LAB WITHIN 5 DAYS.        LOWEST DETECTABLE LIMITS FOR URINE DRUG SCREEN Drug Class       Cutoff (ng/mL) Amphetamine      1000 Barbiturate      200 Benzodiazepine   154 Tricyclics       008 Opiates          300 Cocaine          300 THC              50     Blood Alcohol level:  Lab Results  Component Value Date   ETH <5 02/18/2017   ETH <5 67/61/9509    Metabolic Disorder Labs: Lab Results  Component Value Date   HGBA1C 5.4 01/04/2017   MPG 108 01/04/2017   MPG 120 08/19/2015  Lab Results  Component Value Date   PROLACTIN 18.7 01/04/2017   PROLACTIN 15.6 08/19/2015   Lab Results  Component Value Date   CHOL 179 01/04/2017   TRIG 158 (H) 01/04/2017   HDL 58 01/04/2017   CHOLHDL 3.1 01/04/2017   VLDL 32 01/04/2017   LDLCALC 89 01/04/2017   LDLCALC 72 08/19/2015    Physical Findings: AIMS: Facial and Oral Movements Muscles of Facial Expression: None, normal Lips and Perioral Area: None, normal Jaw: None, normal Tongue: None, normal,Extremity Movements Upper (arms, wrists, hands, fingers): None, normal Lower (legs, knees, ankles, toes): None, normal, Trunk Movements Neck, shoulders, hips: None, normal, Overall Severity Severity of abnormal movements (highest score from questions above): None, normal Incapacitation due to abnormal movements: None, normal Patient's awareness of abnormal movements (rate only patient's report): No Awareness, Dental Status Current problems with teeth and/or dentures?: No Does patient usually wear dentures?: No  CIWA:  CIWA-Ar Total: 0 COWS:     Musculoskeletal: Strength & Muscle Tone: within normal limits Gait & Station:  normal Patient leans: N/A  Psychiatric Specialty Exam: Physical Exam  ROS denies chest pain, denies shortness of breath, no vomiting   Blood pressure (!) 133/96, pulse 71, temperature 98.3 F (36.8 C), temperature source Oral, resp. rate 18, height 5' 6.5" (1.689 m), weight 109.3 kg (241 lb).Body mass index is 38.32 kg/m.  General Appearance: Fairly Groomed  Eye Contact:  Good  Speech:  Slow  Volume:  Decreased  Mood:  reports feeling better, presents depressed   Affect:  flat affect, smiles briefly at times   Thought Process:  Linear and Descriptions of Associations: Circumstantial  Orientation:  Other:  fully alert and attentive   Thought Content:  she denies hallucinations, no delusions are expressed at this time  Suicidal Thoughts:  No denies suicidal plan or intention and contracts for safety at this time  Homicidal Thoughts:  Nodenies any homicidal ideations, and also specifically denies any homicidal or violent ideations towards mother in law   Memory:  recent and remote grossly intact   Judgement:  Fair  Insight:  Fair  Psychomotor Activity:  Decreased  Concentration:  Concentration: Good and Attention Span: Good  Recall:  Good  Fund of Knowledge:  Good  Language:  Fair  Akathisia:  Negative  Handed:  Right  AIMS (if indicated):     Assets:  Desire for Improvement Resilience  ADL's:  Intact  Cognition:  WNL  Sleep:  Number of Hours: 6.5   Assessment - patient minimizes symptoms at this time, and states her mood is better. Denies hallucinations or other psychotic symptoms, and denies SI. She does present isolative, with a flat , sad affect . Speech is soft and limited . She is future oriented, planning to go live with a friend after discharge. Denies medication side effects.   Treatment Plan Summary: Daily contact with patient to assess and evaluate symptoms and progress in treatment, Medication management, Plan inpatient treatment  and medications as below Encourage  increased group and milieu participation to work on coping skills and symptom reduction Continue  Abilify at 10 mgrs QDAY for depression, mood disorder Continue Zoloft 100 mgrs QDAY for depression Continue Trazodone 50 mgrs QHS PRN for insomnia Continue Vistaril 25 mgrs Q 6 hours PRN for anxiety Treatment team working on disposition planning options   Jenne Campus, MD 02/20/2017, 10:50 AM

## 2017-02-21 DIAGNOSIS — F17210 Nicotine dependence, cigarettes, uncomplicated: Secondary | ICD-10-CM

## 2017-02-21 DIAGNOSIS — F1290 Cannabis use, unspecified, uncomplicated: Secondary | ICD-10-CM

## 2017-02-21 NOTE — Progress Notes (Signed)
[facility] MD Progress Note  02/21/2017 5:52 PM Sarah [name]  MRN:  [id number] Subjective:  Patient states " I feel OK".   No complaints at this time.  Quiet in her room. Objective:  I have discussed case with treatment team and have met with patient . She presents calm, flat affect, in room under the covers. Non as conversant, answers most questions with short sentences /mono-syllables. Denies medication side effects. Denies hallucinations . No disruptive or agitated behaviors on unit, going to some groups, but interaction with peers is limited .  Principal Problem:  Schizoaffective Disorder- Depressed  Diagnosis:   Patient Active Problem List   Diagnosis Date Noted  . MDD (major depressive disorder), recurrent episode, severe ([place]) [F33.2] 02/19/2017  . Schizoaffective disorder, bipolar type ([place]) [F25.0] 01/06/2017  . 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 IIH (idiopathic intracranial hypertension) [G93.2] 01/24/2016  . Cannabis use disorder, moderate, dependence ([place]) [F12.20] 08/18/2015  . Schizoaffective disorder, depressive type ([place]) [F25.1] 01/15/2012    Class: Acute   Total Time spent with patient: 20 minutes  Past Medical History:  Past Medical History:  Diagnosis Date  . Anemia   . Anxiety   . Depression   . Elevated bilirubin    slight elevation at 1.3  . Hypertension   . Hypokalemia   . Intracranial hypertension   . Migraines     Past Surgical History:  Procedure Laterality Date  . NO PAST SURGERIES     Family History:  Family History  Problem Relation Age of Onset  . Schizophrenia Mother   . Hypertension Mother   . Anesthesia problems Neg Hx   . Malignant hyperthermia Neg Hx   . Pseudochol deficiency Neg Hx   . Hypotension Neg Hx   . Migraines Neg Hx    Social History:  History  Alcohol Use No    Comment: denied all drug/alcohol use     History  Drug Use  . Types: Marijuana    Comment: denied all drug/alcohol use    Social History    Social History  . Marital status: Single    Spouse name: N/A  . Number of children: 0  . Years of education: 12   Occupational History  . Unemployed    Social History Main Topics  . Smoking status: Current Every Day Smoker    Packs/day: 1.00    Years: 15.00    Types: Cigarettes    Last attempt to quit: 01/02/2017  . Smokeless tobacco: Never Used     Comment: smoked since 41 yrs old  . Alcohol use No     Comment: denied all drug/alcohol use  . Drug use: Yes    Types: Marijuana     Comment: denied all drug/alcohol use  . Sexual activity: Yes    Birth control/ protection: None   Other Topics Concern  . None   Social History Narrative   Lives with fiance, Erlene Quan   Caffeine use: Soda: 1 soda daily   No coffee   No tea   Additional Social History:   Sleep: improved  Appetite:  Good  Current Medications: Current Facility-Administered Medications  Medication Dose Route Frequency Provider Last Rate Last Dose  . alum & mag hydroxide-simeth (MAALOX/MYLANTA) 200-200-20 MG/5ML suspension 30 mL  30 mL Oral Q4H PRN Patriciaann Clan E, PA-C      . amLODipine (NORVASC) tablet 10 mg  10 mg Oral Daily Catarina Huntley, [name], MD   10 mg at 02/21/17 1216  .  ARIPiprazole (ABILIFY) tablet 10 mg  10 mg Oral Daily Shanisha Lech, [name], MD   10 mg at 02/21/17 1216  . hydrOXYzine (ATARAX/VISTARIL) tablet 25 mg  25 mg Oral Q6H PRN Patriciaann Clan E, PA-C      . magnesium hydroxide (MILK OF MAGNESIA) suspension 30 mL  30 mL Oral Daily PRN Patriciaann Clan E, PA-C      . nicotine (NICODERM CQ - dosed in mg/24 hours) patch 21 mg  21 mg Transdermal Daily Andrick Rust, [name], MD   21 mg at 02/21/17 1216  . sertraline (ZOLOFT) tablet 100 mg  100 mg Oral QHS Patriciaann Clan E, PA-C   100 mg at 02/20/17 2135  . traZODone (DESYREL) tablet 50 mg  50 mg Oral QHS PRN Laverle Hobby, PA-C        Lab Results:  No results found for this or any previous visit (from the past 48 hour(s)).  Blood Alcohol level:  Lab  Results  Component Value Date   ETH <5 02/18/2017   ETH <5 41/28/7867    Metabolic Disorder Labs: Lab Results  Component Value Date   HGBA1C 5.4 01/04/2017   MPG 108 01/04/2017   MPG 120 08/19/2015   Lab Results  Component Value Date   PROLACTIN 18.7 01/04/2017   PROLACTIN 15.6 08/19/2015   Lab Results  Component Value Date   CHOL 179 01/04/2017   TRIG 158 (H) 01/04/2017   HDL 58 01/04/2017   CHOLHDL 3.1 01/04/2017   VLDL 32 01/04/2017   LDLCALC 89 01/04/2017   LDLCALC 72 08/19/2015    Physical Findings: AIMS: Facial and Oral Movements Muscles of Facial Expression: None, normal Lips and Perioral Area: None, normal Jaw: None, normal Tongue: None, normal,Extremity Movements Upper (arms, wrists, hands, fingers): None, normal Lower (legs, knees, ankles, toes): None, normal, Trunk Movements Neck, shoulders, hips: None, normal, Overall Severity Severity of abnormal movements (highest score from questions above): None, normal Incapacitation due to abnormal movements: None, normal Patient's awareness of abnormal movements (rate only patient's report): No Awareness, Dental Status Current problems with teeth and/or dentures?: No Does patient usually wear dentures?: No  CIWA:  CIWA-Ar Total: 0 COWS:     Musculoskeletal: Strength & Muscle Tone: within normal limits Gait & Station: normal Patient leans: N/A  Psychiatric Specialty Exam: Physical Exam  Nursing note and vitals reviewed. Psychiatric: She is slowed and withdrawn. She exhibits a depressed mood.    ROS denies chest pain, denies shortness of breath, no vomiting   Blood pressure (!) 142/92, pulse 77, temperature 98.4 F (36.9 C), temperature source Oral, resp. rate 18, height 5' 6.5" (1.689 m), weight 109.3 kg (241 lb).Body mass index is 38.32 kg/m.  General Appearance: Fairly Groomed  Eye Contact:  Good  Speech:  Slow  Volume:  Decreased  Mood:  reports feeling better, presents depressed and still flat  affect  Affect:  flat affect, smiles briefly at times   Thought Process:  Linear and Descriptions of Associations: Circumstantial  Orientation:  Other:  fully alert and attentive   Thought Content:  she denies hallucinations, no delusions are expressed at this time  Suicidal Thoughts:  No denies suicidal plan or intention and contracts for safety at this time  Homicidal Thoughts:  No denies any homicidal ideations, and also specifically denies any homicidal or violent ideations towards mother in law   Memory:  recent and remote grossly intact   Judgement:  Fair  Insight:  Fair  Psychomotor Activity:  Decreased  Concentration:  Concentration: Good and Attention Span: Good  Recall:  Good  Fund of Knowledge:  Good  Language:  Fair  Akathisia:  Negative  Handed:  Right  AIMS (if indicated):     Assets:  Desire for Improvement Resilience  ADL's:  Intact  Cognition:  WNL  Sleep:  Number of Hours: 6.75   Assessment - patient minimizes symptoms at this time, and states her mood is better. Denies hallucinations or other psychotic symptoms, and denies SI. She does present isolative, with a flat , sad affect . Speech is soft and limited . She is future oriented, planning to go live with a friend after discharge. Denies medication side effects.   Treatment Plan Summary: Daily contact with patient to assess and evaluate symptoms and progress in treatment, Medication management, Plan inpatient treatment  and medications as below Encourage increased group and milieu participation to work on coping skills and symptom reduction Continue  Abilify at [place] for depression, mood disorder Continue Zoloft 100 mgrs QDAY for depression Continue Trazodone 50 mgrs QHS PRN for insomnia Continue Vistaril 25 mgrs Q 6 hours PRN for anxiety Treatment team working on disposition planning options   Janett Labella, NP [facility] 02/21/2017, 5:52 PM   Agree with NP Progress Note

## 2017-02-21 NOTE — Plan of Care (Signed)
Problem: Coping: Goal: Ability to cope will improve Outcome: Progressing Pt denies SI at this time   

## 2017-02-21 NOTE — Progress Notes (Signed)
D Caren Griffins is in her bed, lying still and quiet...where shes speant much of the day. A SHe is flat, blunted and she does not inittiate conversation with this Probation officer. A She takes her scheduled meds and she rates her depression, hopelessness and anxeity " 0/0/0", respectively and she deneis active SI today. R Safety in place. COnt to engage pt and encurage pt engagement in her recovery.

## 2017-02-21 NOTE — [facility] Group Notes (Signed)
[facility] LCSW Group Therapy 02/21/2017 1:15pm  Type of Therapy: Group Therapy- Feelings Around Relapse and Recovery  Participation Level: Pt invited. Did not attend.    Georga Kaufmann, MSW, Latanya Presser [phone] 02/21/2017 3:46 PM

## 2017-02-21 NOTE — Progress Notes (Signed)
D: Pt denies SI/HI/AVH. Pt is pleasant and cooperative. Pt stated she felt better due to not having SI or depression. Pt stated the groups are helping her with her recovery. Pt isolates to self this evening, pt has minimal interaction with peers.    A: Pt was offered support and encouragement. Pt was given scheduled medications. Pt was encourage to attend groups. Q 15 minute checks were done for safety.   R:Pt attends groups and interacts well with peers and staff. Pt is taking medication. Pt has no complaints at this time .Pt receptive to treatment and safety maintained on unit.

## 2017-02-21 NOTE — [facility] Group Notes (Signed)
[facility] LCSW Aftercare Discharge Planning Group Note   02/21/2017  8:45 AM  Participation Quality:  Pt invited. Did not attend.  Georga Kaufmann, MSW, LCSWA 02/21/2017 11:03 AM

## 2017-02-21 NOTE — Progress Notes (Signed)
Recreation Therapy Notes  Date: 02/21/17 Time: 0930 Location: [address] Dayroom  Group Topic: Stress Management  Goal Area(s) Addresses:  Patient will verbalize importance of using healthy stress management.  Patient will identify positive emotions associated with healthy stress management.   Intervention: Stress Management  Activity :  Guided Imagery.  LRT introduced the stress management technique of guided imagery.  LRT read a script to guide patients and engage them in the technique.  Patients were to follow along as LRT read script to fully participate in the technique.  Education:  Stress Management, Discharge Planning.   Education Outcome: Acknowledges edcuation/In group clarification offered/Needs additional education  Clinical Observations/Feedback: Pt did not attend group.   Victorino Sparrow, LRT/CTRS         Victorino Sparrow A 02/21/2017 12:24 PM

## 2017-02-22 NOTE — Progress Notes (Signed)
Pt attend wrap up group. 

## 2017-02-22 NOTE — Progress Notes (Signed)
[facility] MD Progress Note  02/22/2017 4:24 PM Sarah [name]  MRN:  [id number] Subjective:  Patient states " I feel OK".   No complaints at this time.  Quiet in her room. Objective:  I have discussed case with treatment team and have met with patient . She presents calm, flat affect, in room under the covers. Non as conversant, answers most questions with short sentences /mono-syllables. Denies medication side effects. Denies hallucinations . No disruptive or agitated behaviors on unit, going to some groups, but interaction with peers is limited .  Principal Problem:  Schizoaffective Disorder- Depressed  Diagnosis:   Patient Active Problem List   Diagnosis Date Noted  . MDD (major depressive disorder), recurrent episode, severe ([place]) [F33.2] 02/19/2017  . Schizoaffective disorder, bipolar type ([place]) [F25.0] 01/06/2017  . 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 IIH (idiopathic intracranial hypertension) [G93.2] 01/24/2016  . Cannabis use disorder, moderate, dependence ([place]) [F12.20] 08/18/2015  . Schizoaffective disorder, depressive type ([place]) [F25.1] 01/15/2012    Class: Acute   Total Time spent with patient: 20 minutes  Past Medical History:  Past Medical History:  Diagnosis Date  . Anemia   . Anxiety   . Depression   . Elevated bilirubin    slight elevation at 1.3  . Hypertension   . Hypokalemia   . Intracranial hypertension   . Migraines     Past Surgical History:  Procedure Laterality Date  . NO PAST SURGERIES     Family History:  Family History  Problem Relation Age of Onset  . Schizophrenia Mother   . Hypertension Mother   . Anesthesia problems Neg Hx   . Malignant hyperthermia Neg Hx   . Pseudochol deficiency Neg Hx   . Hypotension Neg Hx   . Migraines Neg Hx    Social History:  History  Alcohol Use No    Comment: denied all drug/alcohol use     History  Drug Use  . Types: Marijuana    Comment: denied all drug/alcohol use    Social History    Social History  . Marital status: Single    Spouse name: N/A  . Number of children: 0  . Years of education: 12   Occupational History  . Unemployed    Social History Main Topics  . Smoking status: Current Every Day Smoker    Packs/day: 1.00    Years: 15.00    Types: Cigarettes    Last attempt to quit: 01/02/2017  . Smokeless tobacco: Never Used     Comment: smoked since 41 yrs old  . Alcohol use No     Comment: denied all drug/alcohol use  . Drug use: Yes    Types: Marijuana     Comment: denied all drug/alcohol use  . Sexual activity: Yes    Birth control/ protection: None   Other Topics Concern  . None   Social History Narrative   Lives with fiance, Erlene Quan   Caffeine use: Soda: 1 soda daily   No coffee   No tea   Additional Social History:   Sleep: improved  Appetite:  Good  Current Medications: Current Facility-Administered Medications  Medication Dose Route Frequency Provider Last Rate Last Dose  . alum & mag hydroxide-simeth (MAALOX/MYLANTA) 200-200-20 MG/5ML suspension 30 mL  30 mL Oral Q4H PRN Patriciaann Clan E, PA-C      . amLODipine (NORVASC) tablet 10 mg  10 mg Oral Daily , [name], MD   10 mg at 02/22/17 0919  .  ARIPiprazole (ABILIFY) tablet 10 mg  10 mg Oral Daily Ambriella Kitt, [name], MD   10 mg at 02/22/17 9458  . hydrOXYzine (ATARAX/VISTARIL) tablet 25 mg  25 mg Oral Q6H PRN Patriciaann Clan E, PA-C      . magnesium hydroxide (MILK OF MAGNESIA) suspension 30 mL  30 mL Oral Daily PRN Patriciaann Clan E, PA-C      . nicotine (NICODERM CQ - dosed in mg/24 hours) patch 21 mg  21 mg Transdermal Daily Avelina Mcclurkin, [name], MD   21 mg at 02/22/17 0918  . sertraline (ZOLOFT) tablet 100 mg  100 mg Oral QHS Patriciaann Clan E, PA-C   100 mg at 02/21/17 2134  . traZODone (DESYREL) tablet 50 mg  50 mg Oral QHS PRN Laverle Hobby, PA-C        Lab Results:  No results found for this or any previous visit (from the past 48 hour(s)).  Blood Alcohol level:  Lab  Results  Component Value Date   ETH <5 02/18/2017   ETH <5 59/29/2446    Metabolic Disorder Labs: Lab Results  Component Value Date   HGBA1C 5.4 01/04/2017   MPG 108 01/04/2017   MPG 120 08/19/2015   Lab Results  Component Value Date   PROLACTIN 18.7 01/04/2017   PROLACTIN 15.6 08/19/2015   Lab Results  Component Value Date   CHOL 179 01/04/2017   TRIG 158 (H) 01/04/2017   HDL 58 01/04/2017   CHOLHDL 3.1 01/04/2017   VLDL 32 01/04/2017   LDLCALC 89 01/04/2017   LDLCALC 72 08/19/2015    Physical Findings: AIMS: Facial and Oral Movements Muscles of Facial Expression: None, normal Lips and Perioral Area: None, normal Jaw: None, normal Tongue: None, normal,Extremity Movements Upper (arms, wrists, hands, fingers): None, normal Lower (legs, knees, ankles, toes): None, normal, Trunk Movements Neck, shoulders, hips: None, normal, Overall Severity Severity of abnormal movements (highest score from questions above): None, normal Incapacitation due to abnormal movements: None, normal Patient's awareness of abnormal movements (rate only patient's report): No Awareness, Dental Status Current problems with teeth and/or dentures?: No Does patient usually wear dentures?: No  CIWA:  CIWA-Ar Total: 0 COWS:     Musculoskeletal: Strength & Muscle Tone: within normal limits Gait & Station: normal Patient leans: N/A  Psychiatric Specialty Exam: Physical Exam  Nursing note and vitals reviewed. Psychiatric: She is slowed and withdrawn. She exhibits a depressed mood.    ROS denies chest pain, denies shortness of breath, no vomiting   Blood pressure (!) 149/95, pulse 71, temperature 98.4 F (36.9 C), temperature source Oral, resp. rate 20, height 5' 6.5" (1.689 m), weight 109.3 kg (241 lb).Body mass index is 38.32 kg/m.  General Appearance: Fairly Groomed  Eye Contact:  Good  Speech:  Slow  Volume:  Decreased  Mood:  reports feeling better, presents depressed and still flat  affect  Affect:  flat affect, smiles briefly at times   Thought Process:  Linear and Descriptions of Associations: Circumstantial  Orientation:  Other:  fully alert and attentive   Thought Content:  she denies hallucinations, no delusions are expressed at this time  Suicidal Thoughts:  No denies suicidal plan or intention and contracts for safety at this time  Homicidal Thoughts:  No denies any homicidal ideations, and also specifically denies any homicidal or violent ideations towards mother in law   Memory:  recent and remote grossly intact   Judgement:  Fair  Insight:  Fair  Psychomotor Activity:  Decreased  Concentration:  Concentration: Good and Attention Span: Good  Recall:  Good  Fund of Knowledge:  Good  Language:  Fair  Akathisia:  Negative  Handed:  Right  AIMS (if indicated):     Assets:  Desire for Improvement Resilience  ADL's:  Intact  Cognition:  WNL  Sleep:  Number of Hours: 6.75   Assessment - patient minimizes symptoms at this time, and states her mood is better. Denies hallucinations or other psychotic symptoms, and denies SI. She does present isolative, with a flat, sad affect . Speech is soft and limited . She is future oriented, planning to go live with a friend after discharge. Denies medication side effects.  Treatment Plan Summary:  Reviewed 02/22/2017  Daily contact with patient to assess and evaluate symptoms and progress in treatment, Medication management, Plan inpatient treatment  and medications as below Encourage increased group and milieu participation to work on coping skills and symptom reduction Continue  Abilify at [place] for depression, mood disorder Continue Zoloft 100 mgrs QDAY for depression Continue Trazodone 50 mgrs QHS PRN for insomnia Continue Vistaril 25 mgrs Q 6 hours PRN for anxiety Treatment team working on disposition planning options   Janett Labella, NP [facility] 02/22/2017, 4:24 PM   Agree with NP Progress Note

## 2017-02-22 NOTE — [facility] Group Notes (Signed)
Adult Therapy Group Note  Date:  02/22/2017  Time:  10:00-11:00AM  Group Topic/Focus: Unhealthy vs Healthy Coping Techniques  Building Self Esteem:    The focus of this group was to determine what unhealthy coping techniques typically are used or and what healthy coping techniques would be helpful in coping with various problems. Patients were guided in becoming aware of the differences between healthy and unhealthy coping techniques.  Personal challenges including current unhealthy coping techniques to deal with them were shared by many patients, and brainstorming took place to discuss possible healthier coping skills to employ.  Participation Level:  Minimal  Participation Quality:  Attentive  Affect:  Blunted  Cognitive:  Disorganized  Insight: Limited  Engagement in Group:  Lacking  Modes of Intervention:  Exercise, Discussion and Support  Additional Comments:  The patient expressed [place] in group, saying every time she was called on that she agreed with what everyone else had already said.  Selmer Dominion, LCSW 02/22/2017   1:38 PM

## 2017-02-22 NOTE — Progress Notes (Signed)
D Sarah [name] is seen sitting in the dayroom today. She sits quietly and watches TV. A She completes her daily assessment and on ti she wrote she deneid SI today and she rated her depression, hopelessness and anxeity " 0/0/0/", respectively. R Safety in pacle

## 2017-02-23 NOTE — Progress Notes (Signed)
[facility] MD Progress Note  02/23/2017 10:23 AM [name]  MRN:  [id number] Subjective:   Patient states she is feeling significantly better, and today reports she is not feeling depressed . Denies medication side effects. Objective:  I have reviewed chart notes and have met with patient . Patient presents with improving mood, range of affect. Tolerating medications well  ( Abilify and Zoloft ) .  Denies side effects, and no akathisia is noted or reported. She is less isolative than on admission, visible in day room, and going to some groups, although interaction with peers remains limited. Pleasant and polite on approach.     Principal Problem:  Schizoaffective Disorder- Depressed  Diagnosis:   Patient Active Problem List   Diagnosis Date Noted  . MDD (major depressive disorder), recurrent episode, severe ([place]) [F33.2] 02/19/2017  . Schizoaffective disorder, bipolar type ([place]) [F25.0] 01/06/2017  . 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 IIH (idiopathic intracranial hypertension) [G93.2] 01/24/2016  . Cannabis use disorder, moderate, dependence ([place]) [F12.20] 08/18/2015  . Schizoaffective disorder, depressive type ([place]) [F25.1] 01/15/2012    Class: Acute   Total Time spent with patient: 20 minutes  Past Medical History:  Past Medical History:  Diagnosis Date  . Anemia   . Anxiety   . Depression   . Elevated bilirubin    slight elevation at 1.3  . Hypertension   . Hypokalemia   . Intracranial hypertension   . Migraines     Past Surgical History:  Procedure Laterality Date  . NO PAST SURGERIES     Family History:  Family History  Problem Relation Age of Onset  . Schizophrenia Mother   . Hypertension Mother   . Anesthesia problems Neg Hx   . Malignant hyperthermia Neg Hx   . Pseudochol deficiency Neg Hx   . Hypotension Neg Hx   . Migraines Neg Hx    Social History:  History  Alcohol Use No    Comment: denied all drug/alcohol use     History  Drug  Use  . Types: Marijuana    Comment: denied all drug/alcohol use    Social History   Social History  . Marital status: Single    Spouse name: N/A  . Number of children: 0  . Years of education: 12   Occupational History  . Unemployed    Social History Main Topics  . Smoking status: Current Every Day Smoker    Packs/day: 1.00    Years: 15.00    Types: Cigarettes    Last attempt to quit: 01/02/2017  . Smokeless tobacco: Never Used     Comment: smoked since 41 yrs old  . Alcohol use No     Comment: denied all drug/alcohol use  . Drug use: Yes    Types: Marijuana     Comment: denied all drug/alcohol use  . Sexual activity: Yes    Birth control/ protection: None   Other Topics Concern  . None   Social History Narrative   Lives with fiance, Erlene Quan   Caffeine use: Soda: 1 soda daily   No coffee   No tea   Additional Social History:   Sleep: Good  Appetite:  Good  Current Medications: Current Facility-Administered Medications  Medication Dose Route Frequency Provider Last Rate Last Dose  . alum & mag hydroxide-simeth (MAALOX/MYLANTA) 200-200-20 MG/5ML suspension 30 mL  30 mL Oral Q4H PRN Patriciaann Clan E, PA-C      . amLODipine (NORVASC) tablet 10 mg  10 mg  Oral Daily Albertha Beattie, [name], MD   10 mg at 02/23/17 0853  . ARIPiprazole (ABILIFY) tablet 10 mg  10 mg Oral Daily Reika Callanan, [name], MD   10 mg at 02/23/17 0853  . hydrOXYzine (ATARAX/VISTARIL) tablet 25 mg  25 mg Oral Q6H PRN Patriciaann Clan E, PA-C      . magnesium hydroxide (MILK OF MAGNESIA) suspension 30 mL  30 mL Oral Daily PRN Patriciaann Clan E, PA-C      . nicotine (NICODERM CQ - dosed in mg/24 hours) patch 21 mg  21 mg Transdermal Daily Maudean Hoffmann, [name], MD   21 mg at 02/23/17 0853  . sertraline (ZOLOFT) tablet 100 mg  100 mg Oral QHS Patriciaann Clan E, PA-C   100 mg at 02/22/17 2045  . traZODone (DESYREL) tablet 50 mg  50 mg Oral QHS PRN Laverle Hobby, PA-C        Lab Results:  No results found for  this or any previous visit (from the past 48 hour(s)).  Blood Alcohol level:  Lab Results  Component Value Date   ETH <5 02/18/2017   ETH <5 00/37/0488    Metabolic Disorder Labs: Lab Results  Component Value Date   HGBA1C 5.4 01/04/2017   MPG 108 01/04/2017   MPG 120 08/19/2015   Lab Results  Component Value Date   PROLACTIN 18.7 01/04/2017   PROLACTIN 15.6 08/19/2015   Lab Results  Component Value Date   CHOL 179 01/04/2017   TRIG 158 (H) 01/04/2017   HDL 58 01/04/2017   CHOLHDL 3.1 01/04/2017   VLDL 32 01/04/2017   LDLCALC 89 01/04/2017   LDLCALC 72 08/19/2015    Physical Findings: AIMS: Facial and Oral Movements Muscles of Facial Expression: None, normal Lips and Perioral Area: None, normal Jaw: None, normal Tongue: None, normal,Extremity Movements Upper (arms, wrists, hands, fingers): None, normal Lower (legs, knees, ankles, toes): None, normal, Trunk Movements Neck, shoulders, hips: None, normal, Overall Severity Severity of abnormal movements (highest score from questions above): None, normal Incapacitation due to abnormal movements: None, normal Patient's awareness of abnormal movements (rate only patient's report): No Awareness, Dental Status Current problems with teeth and/or dentures?: No Does patient usually wear dentures?: No  CIWA:  CIWA-Ar Total: 0 COWS:     Musculoskeletal: Strength & Muscle Tone: within normal limits Gait & Station: normal Patient leans: N/A  Psychiatric Specialty Exam: Physical Exam  Nursing note and vitals reviewed. Psychiatric: She is slowed and withdrawn. She exhibits a depressed mood.    ROS denies chest pain, denies shortness of breath, no nausea, no vomiting, no diarrhea   Blood pressure (!) 128/97, pulse 99, temperature 98 F (36.7 C), resp. rate 16, height 5' 6.5" (1.689 m), weight 109.3 kg (241 lb).Body mass index is 38.32 kg/m.  General Appearance: grooming has improved compared to admission   Eye Contact:   Good  Speech:  Slow  Volume:  Normal  Mood:  reports improved mood , denies feeling depressed and still flat affect  Affect:  affect more reactive   Thought Process:  Goal Directed and Descriptions of Associations: Intact  Orientation:  Full (Time, Place, and Person)  Thought Content:  denies hallucinations, no delusions, not internally preocccupied   Suicidal Thoughts:  No denies suicidal plan or intention and contracts for safety at this time  Homicidal Thoughts:  No  Denies any homicidal or violent ideations   Memory:  recent and remote grossly intact   Judgement:  Other:  improving  Insight:  improving   Psychomotor Activity:  Normal  Concentration:  Concentration: Good and Attention Span: Good  Recall:  Good  Fund of Knowledge:  Good  Language:  Good  Akathisia:  No  Handed:  Right  AIMS (if indicated):     Assets:  Desire for Improvement Resilience  ADL's:  Intact  Cognition:  WNL  Sleep:  Number of Hours: 6.75   Assessment -  Patient presents with significant improvement - mood is better, affect is more reactive, no active psychotic symptoms, no suicidal ideations, future oriented . Tolerating medications well, feels they are helping .    Treatment Plan Summary: Daily contact with patient to assess and evaluate symptoms and progress in treatment, Medication management, Plan inpatient treatment  and medications as below Encourage increased group and milieu participation to work on coping skills and symptom reduction Continue  Abilify 10 mgrs QDAY for depression, mood disorder Continue Zoloft 100 mgrs QDAY for depression Continue Trazodone 50 mgrs QHS PRN for insomnia Continue Vistaril 25 mgrs Q 6 hours PRN for anxiety Treatment team working on disposition planning options- patient states that she plans to go live with a friend after discharge.    Jenne Campus, MD  02/23/2017, 10:23 AM   Patient ID: Sarah [name], female   DOB: 08/22/1976, 41 y.o.   MRN:  [id number]

## 2017-02-23 NOTE — Progress Notes (Signed)
Sarah [name] , again, has spent most of the day in her bed. She is flat, depressed and disheveled. A She completed her daily assessment and on this she  She wrote she deneid SI today and she rated her depression, hopelessness and anxeity " 0/0/0/", respectively. She has a sad, distant affect. She does not engage in conversation withthis Probation officer. R Safety in place.

## 2017-02-23 NOTE — [facility] Group Notes (Signed)
[facility] Group Notes:  (Clinical Social Work)   02/23/2017    10:00-11:00AM  Summary of Progress/Problems:   The main focus of today's process group was to   1)  discuss the importance of adding supports  2)  identify the patient's current healthy supports and plan what to add.  An emphasis was placed on using counselor, doctor, therapy groups, 12-step groups, and problem-specific support groups to expand supports.    The patient expressed full comprehension of the concepts presented, and agreed that there is a need to add more supports.  The patient stated she has the support of her family and husband, and she stopped going to her medication provider, so needs referrals for support groups, a psychiatrist, and a therapist.  Type of Therapy:  Process Group with Motivational Interviewing  Participation Level:  Minimal  Participation Quality:  Attentive  Affect:  Flat  Cognitive:  Confused  Insight:  Limited  Engagement in Therapy:  Limited  Modes of Intervention:   Education, Support and Processing  Selmer Dominion, LCSW 02/23/2017    1:08 PM

## 2017-02-23 NOTE — Progress Notes (Signed)
Adult Psychoeducational Group Note  Date:  02/23/2017 Time:  1:11 AM  Group Topic/Focus:  Wrap-Up Group:   The focus of this group is to help patients review their daily goal of treatment and discuss progress on daily workbooks.  Participation Level:  Active  Participation Quality:  Appropriate  Affect:  Appropriate  Cognitive:  Appropriate  Insight: Improving  Engagement in Group:  Engaged and Improving  Modes of Intervention:  Discussion  Additional Comments:  Pt stated she had a good , she has enjoyed going to groups  Vonzella Nipple A 02/23/2017, 1:11 AM

## 2017-02-24 MED ORDER — AMLODIPINE BESYLATE 10 MG PO TABS: 10.0000 mg | ORAL_TABLET | Freq: Every day | ORAL | 0 refills | Status: DC

## 2017-02-24 MED ORDER — HYDROXYZINE HCL 25 MG PO TABS: 25.0000 mg | ORAL_TABLET | Freq: Four times a day (QID) | ORAL | 0 refills | Status: DC | PRN

## 2017-02-24 MED ORDER — TRAZODONE HCL 50 MG PO TABS: 50.0000 mg | ORAL_TABLET | Freq: Every evening | ORAL | 0 refills | Status: DC | PRN

## 2017-02-24 MED ORDER — SERTRALINE HCL 100 MG PO TABS: 100.0000 mg | ORAL_TABLET | Freq: Every day | ORAL | 0 refills | Status: DC

## 2017-02-24 MED ORDER — NICOTINE 21 MG/24HR TD PT24: 21.0000 mg | MEDICATED_PATCH | Freq: Every day | TRANSDERMAL | 0 refills | Status: DC

## 2017-02-24 MED ORDER — ARIPIPRAZOLE 10 MG PO TABS: 10.0000 mg | ORAL_TABLET | Freq: Every day | ORAL | 0 refills | Status: DC

## 2017-02-24 NOTE — Progress Notes (Signed)
Sarah [name] had been up and visible in milieu this evening, did attend and participate in evening group activity. She was seen smiling and interacting appropriately in milieu. She spoke about how she was feeling better today and was able to receive bedtime medication without incident and did not verbalize any complaints of pain. A. Support and encouragement provided. R. Safety maintained, will continue to monitor.

## 2017-02-24 NOTE — Progress Notes (Signed)
Discharge note:  Patient discharged home per MD order.  Patient denies any depressive symptoms this morning.  She denies any thoughts of self harm.  She denies HI and is not responding to internal stimuli.  Patient is sleeping and eating well.  Patient received all personal belongings from unit and locker.  Reviewed AVS/transition record with patient and she indicated understanding.  Patient received prescriptions of her medications.  Patient will follow up with [facility] upon discharge.  Patient left ambulatory with a bus pass.

## 2017-02-24 NOTE — [facility] Suicide Risk Assessment (Signed)
[facility] Discharge Suicide Risk Assessment   Principal Problem: Depression Discharge Diagnoses:  Patient Active Problem List   Diagnosis Date Noted  . MDD (major depressive disorder), recurrent episode, severe ([place]) [F33.2] 02/19/2017  . Schizoaffective disorder, bipolar type ([place]) [F25.0] 01/06/2017  . 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 IIH (idiopathic intracranial hypertension) [G93.2] 01/24/2016  . Cannabis use disorder, moderate, dependence ([place]) [F12.20] 08/18/2015  . Schizoaffective disorder, depressive type ([place]) [F25.1] 01/15/2012    Class: Acute    Total Time spent with patient: 30 minutes  Musculoskeletal: Strength & Muscle Tone: within normal limits Gait & Station: normal Patient leans: N/A  Psychiatric Specialty Exam: ROS denies chest pain, denies shortness of breath, no vomiting, no fever or chills   Blood pressure 134/87, pulse 83, temperature 98.8 F (37.1 C), temperature source Oral, resp. rate 20, height 5' 6.5" (1.689 m), weight 109.3 kg (241 lb).Body mass index is 38.32 kg/m.  General Appearance: improved since admission  Eye Contact::  Good  Speech:  Normal Rate409  Volume:  Decreased  Mood:  denies depression, presents euthymic  Affect:  reactive, smiles often, appropriate affect   Thought Process:  Linear and Descriptions of Associations: Intact  Orientation:  Other:  fully alert and attentive   Thought Content:  denies hallucinations, no delusions, not internally preoccupied  Suicidal Thoughts:  No denies any suicidal or self injurious ideations, denies any homicidal or violent ideations   Homicidal Thoughts:  No  Memory:  recent and remote grossly intact   Judgement:  Other:  improving   Insight:  improving   Psychomotor Activity:  Normal  Concentration:  Good  Recall:  Good  Fund of Knowledge:Good  Language: Good  Akathisia:  Negative  Handed:  Right  AIMS (if indicated):   no abnormal involuntary movements noted or reported    Assets:  Desire for Improvement Resilience  Sleep:  Number of Hours: 6.5  Cognition: WNL  ADL's:  Intact   Mental Status Per Nursing Assessment::   On Admission:  NA  Demographic Factors:  41 year old married female   Loss Factors: Homelessness , limited social support   Historical Factors: Has been diagnosed with Schizoaffective Disorder and with Mild Intellectual Disability in the past .   Risk Reduction Factors:   Sense of responsibility to family, Living with another person, especially a relative and Positive coping skills or problem solving skills  Continued Clinical Symptoms:  Patient denies current symptoms of depression and presents with improved mood and a full range of affect, denies any suicidal ideations, denies homicidal or violent ideations, no psychotic symptoms. Behavior on unit calm,  in good control. Denies medication side effects . She states she is feeling better partly because her mother in law has allowed her husband and her to return to live with her after she is discharged from hospital.   Cognitive Features That Contribute To Risk:  No gross cognitive deficits noted upon discharge. Is alert , attentive.  Suicide Risk:  Mild:  Suicidal ideation of limited frequency, intensity, duration, and specificity.  There are no identifiable plans, no associated intent, mild dysphoria and related symptoms, good self-control (both objective and subjective assessment), few other risk factors, and identifiable protective factors, including available and accessible social support.  Follow-up Information    [facility] Follow up.   Specialty:  Behavioral Health Why:  Please go for a walk-in appointment within 1-3 days of discharge to be reestablished for outpatient services. Walk-in hours are Mon-Fri 8 AM-3 PM.  Please arrive as early as possible to be sure that you are seen. Contact information: [place] [address] [phone]           Plan Of  Care/Follow-up recommendations:  Activity:  as tolerated  Diet:  Regular Tests:  NA Other:  See below  Patient reports she feels ready for discharge today and is leaving unit in good spirits . Plans to go live with her mother in law. Follow up as above .   Jenne Campus, MD 02/24/2017, 9:56 AM

## 2017-02-24 NOTE — Discharge Summary (Signed)
Physician Discharge Summary Note  Patient:  Sarah [name] is an 41 y.o., female  MRN:  [id number]  DOB:  1975/10/09  Patient phone:  [phone] (home)   Patient address:   [address] [place] [address],   Total Time spent with patient: Greater than 30 minutes  Date of Admission:  02/19/2017 Date of Discharge: 0517-18  Reason for Admission: Worsening symptoms of Schizoaffective disorder triggering suicide attempt by overdose.   Principal Problem: Schizoaffective disorder, depressive type.  Discharge Diagnoses: Patient Active Problem List   Diagnosis Date Noted  . Schizoaffective disorder, depressive type ([place]) [F25.1] 01/15/2012    Priority: High    Class: Acute  . MDD (major depressive disorder), recurrent episode, severe ([place]) [F33.2] 02/19/2017  . Schizoaffective disorder, bipolar type ([place]) [F25.0] 01/06/2017  . 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 IIH (idiopathic intracranial hypertension) [G93.2] 01/24/2016  . Cannabis use disorder, moderate, dependence ([place]) [F12.20] 08/18/2015   Past Psychiatric History: Hx. Schizoaffective disorder, depressive type.  Past Medical History:  Past Medical History:  Diagnosis Date  . Anemia   . Anxiety   . Depression   . Elevated bilirubin    slight elevation at 1.3  . Hypertension   . Hypokalemia   . Intracranial hypertension   . Migraines     Past Surgical History:  Procedure Laterality Date  . NO PAST SURGERIES     Family History:  Family History  Problem Relation Age of Onset  . Schizophrenia Mother   . Hypertension Mother   . Anesthesia problems Neg Hx   . Malignant hyperthermia Neg Hx   . Pseudochol deficiency Neg Hx   . Hypotension Neg Hx   . Migraines Neg Hx    Family Psychiatric  History: See H&P  Social History:  History  Alcohol Use No    Comment: denied all drug/alcohol use     History  Drug Use  . Types: Marijuana    Comment: denied all drug/alcohol use    Social  History   Social History  . Marital status: Single    Spouse name: N/A  . Number of children: 0  . Years of education: 12   Occupational History  . Unemployed    Social History Main Topics  . Smoking status: Current Every Day Smoker    Packs/day: 1.00    Years: 15.00    Types: Cigarettes    Last attempt to quit: 01/02/2017  . Smokeless tobacco: Never Used     Comment: smoked since 41 yrs old  . Alcohol use No     Comment: denied all drug/alcohol use  . Drug use: Yes    Types: Marijuana     Comment: denied all drug/alcohol use  . Sexual activity: Yes    Birth control/ protection: None   Other Topics Concern  . None   Social History Narrative   Lives with fiance, Erlene Quan   Caffeine use: Soda: 1 soda daily   No coffee   No tea   Hospital Course: This is an admission assessment for this 41 year old African-American female known to our unit from previous hospitalizations for mood stabilization treatments. Admitted to the [facility] this time with complaints of worsening symptoms of depression triggering suicidal ideations. During this assessment; Smantha reports, "A cab took me to the [facility] yesterday. I had gone to [facility] because I was feeling like hurting myself. [facility] rather called a cab to take me to the ED. I have been depressed  real bad x 1 week. Them, I became suicidal. I tried to kill myself yesterday. I took 20 (500 mg) tablets of Tylenol all at once. My depression worsened because I am homeless. I have been on the streets x 3 days. I was staying at my my mother in-law's home, then, my husband was admitted at the [facility] for drug detox 3 days ago. I was in this hospital in March of 2018. I have doing good until 3 days ago. This is my second suicide attempts. I need help".  Synthiawas admitted to th [facility] adult unit with complaints of worsening symptoms of depression triggering suicidal thoughts & an attempt by overdose. She cited homelessness as the related  stressors & the trigger. She was in need of mood stabilization treatments.   During the course of her hospitalization, Valory was medicated & discharged on, Abilify 10 mg for mood control, Hydroxyzine 25 mg prn for anxiety, Nicotine patch 21 mg for smoking cessation, Sertraline 100 mg for depression & Trazodone 50 mg for insomnia. She was enrolled & participated in the group counseling sessions being offered & held on this unit. She was counseled & learned coping skills that should help her cope better & maintain mood stability after discharge. She was resumed on all her pertinent home medications for the other previously existing medical issues that she presented. She tolerated her treatment regimen without any adverse effects reported.   While her treatment was on going, Jashayla's improvement was monitored by observation & her daily reports of symptom reduction noted.  Her emotional & mental status were monitored by daily self-inventory reports completed by her & the clinical staff. She was evaluated daily by the treatment team for mood stability & the need for continued recovery after discharge. Her motivation was an integral factor in her recovery & mood stability. She was offered further treatment options upon discharge & will follow up with the outpatient psychiatric services as listed below.    Upon discharge, Kaiulani was both mentally & medically stable. She is currently denying any suicidal, homicidal ideations, auditory, visual/tactile hallucinations, delusional thoughts & or paranoia. She was provided with a 7 days worth, supply samples of her [facility] discharge medications. She left [facility] with all personal belongings in no apparent distress. Transportation per city bus. [place] assisted with bus pass.       Physical Findings: AIMS: Facial and Oral Movements Muscles of Facial Expression: None, normal Lips and Perioral Area: None, normal Jaw: None, normal Tongue: None, normal,Extremity Movements Upper  (arms, wrists, hands, fingers): None, normal Lower (legs, knees, ankles, toes): None, normal, Trunk Movements Neck, shoulders, hips: None, normal, Overall Severity Severity of abnormal movements (highest score from questions above): None, normal Incapacitation due to abnormal movements: None, normal Patient's awareness of abnormal movements (rate only patient's report): No Awareness, Dental Status Current problems with teeth and/or dentures?: No Does patient usually wear dentures?: No  CIWA:  CIWA-Ar Total: 0 COWS:     Musculoskeletal: Strength & Muscle Tone: within normal limits Gait & Station: normal Patient leans: N/A  Psychiatric Specialty Exam: Physical Exam  Constitutional: She appears well-developed.  HENT:  Head: Normocephalic.  Eyes: Pupils are equal, round, and reactive to light.  Neck: Normal range of motion.  Cardiovascular: Normal rate.   Respiratory: Effort normal.  GI: Soft.  Genitourinary:  Genitourinary Comments: Deferred  Musculoskeletal: Normal range of motion.  Neurological: She is alert.  Skin: Skin is warm.    Review of Systems  Constitutional: Negative.  HENT: Negative.   Eyes: Negative.   Respiratory: Negative.   Cardiovascular: Negative.   Gastrointestinal: Negative.   Genitourinary: Negative.   Musculoskeletal: Negative.   Skin: Negative.   Neurological: Negative.   Endo/Heme/Allergies: Negative.   Psychiatric/Behavioral: Positive for depression (Stable) and substance abuse (Hx. cannabis use disorder). Negative for hallucinations, memory loss and suicidal ideas. The patient has insomnia (Stable). The patient is not nervous/anxious.     Blood pressure 134/87, pulse 83, temperature 98.8 F (37.1 C), temperature source Oral, resp. rate 20, height 5' 6.5" (1.689 m), weight 109.3 kg (241 lb).Body mass index is 38.32 kg/m.  See Md's SRA.   Have you used any form of tobacco in the last 30 days? (Cigarettes, Smokeless Tobacco, Cigars, and/or  Pipes): Yes  Has this patient used any form of tobacco in the last 30 days? (Cigarettes, Smokeless Tobacco, Cigars, and/or Pipes): Yes, provided with a nicotine patch 21 mg prescription for smoking cessation.  Blood Alcohol level:  Lab Results  Component Value Date   [facility] <5 02/18/2017   ETH <5 31/54/0086   Metabolic Disorder Labs:  Lab Results  Component Value Date   HGBA1C 5.4 01/04/2017   MPG 108 01/04/2017   MPG 120 08/19/2015   Lab Results  Component Value Date   PROLACTIN 18.7 01/04/2017   PROLACTIN 15.6 08/19/2015   Lab Results  Component Value Date   CHOL 179 01/04/2017   TRIG 158 (H) 01/04/2017   HDL 58 01/04/2017   CHOLHDL 3.1 01/04/2017   VLDL 32 01/04/2017   LDLCALC 89 01/04/2017   LDLCALC 72 08/19/2015   See Psychiatric Specialty Exam and Suicide Risk Assessment completed by Attending Physician prior to discharge.  Discharge destination:  Home  Is patient on multiple antipsychotic therapies at discharge:  No   Has Patient had three or more failed trials of antipsychotic monotherapy by history:  No  Recommended Plan for Multiple Antipsychotic Therapies: NA  Allergies as of 02/24/2017      Reactions   Penicillins Anaphylaxis   Has patient had a PCN reaction causing immediate rash, facial/tongue/throat swelling, SOB or lightheadedness with hypotension: throat and face swelling.  Has patient had a PCN reaction causing severe rash involving mucus membranes or skin necrosis: {Yes/ Has patient had a PCN reaction that required hospitalization Yes Has patient had a PCN reaction occurring within the last 10 years: No If all of the above answers are "NO", then may proceed with Cephalosporin use.      Medication List    STOP taking these medications   acetaminophen 500 MG tablet Commonly known as:  TYLENOL   risperiDONE 2 MG tablet Commonly known as:  RISPERDAL     TAKE these medications     Indication  amLODipine 10 MG tablet Commonly known as:   NORVASC Take 1 tablet (10 mg total) by mouth daily. For high blood pressure  Indication:  High Blood Pressure Disorder   ARIPiprazole 10 MG tablet Commonly known as:  ABILIFY Take 1 tablet (10 mg total) by mouth daily. For mood control What changed:  medication strength  how much to take  additional instructions  Indication:  Mixed Bipolar Affective Disorder   hydrOXYzine 25 MG tablet Commonly known as:  ATARAX/VISTARIL Take 1 tablet (25 mg total) by mouth every 6 (six) hours as needed for anxiety.  Indication:  Anxiety Neurosis   nicotine 21 mg/24hr patch Commonly known as:  NICODERM CQ - dosed in mg/24 hours Place 1 patch (21 mg total) onto  the skin daily. For nicotine withdrawal  Indication:  Nicotine Addiction   sertraline 100 MG tablet Commonly known as:  ZOLOFT Take 1 tablet (100 mg total) by mouth at bedtime. For depression What changed:  additional instructions  Another medication with the same name was removed. Continue taking this medication, and follow the directions you see here.  Indication:  Major Depressive Disorder   traZODone 50 MG tablet Commonly known as:  DESYREL Take 1 tablet (50 mg total) by mouth at bedtime as needed for sleep.  Indication:  Trouble Sleeping      Follow-up Information    [facility] Follow up.   Specialty:  Behavioral Health Why:  Please go for a walk-in appointment within 1-3 days of discharge to be reestablished for outpatient services. Walk-in hours are Mon-Fri 8 AM-3 PM. Please arrive as early as possible to be sure that you are seen. Contact information: [place] [address] [phone]          Follow-up recommendations: Activity:  As tolerated Diet: As recommended by your primary care doctor. Keep all scheduled follow-up appointments as recommended.  Comments: Patient is instructed prior to discharge to: Take all medications as prescribed by his/her mental healthcare provider. Report any adverse  effects and or reactions from the medicines to his/her outpatient provider promptly. Patient has been instructed & cautioned: To not engage in alcohol and or illegal drug use while on prescription medicines. In the event of worsening symptoms, patient is instructed to call the crisis hotline, 911 and or go to the nearest ED for appropriate evaluation and treatment of symptoms. To follow-up with his/her primary care provider for your other medical issues, concerns and or health care needs.   Signed: Encarnacion Slates, NP, PMHNP, FNP-BC 02/24/2017, 10:28 AM  Patient seen, Suicide Assessment Completed.  Disposition Plan Reviewed

## 2017-02-24 NOTE — Tx Team (Signed)
Interdisciplinary Treatment and Diagnostic Plan Update 02/24/2017 Time of Session: 9:30am  Sarah [name]  MRN: [id number]  Principal Diagnosis: Schizoaffective Disorder- currently depressed   Secondary Diagnoses: Active Problems:   MDD (major depressive disorder), recurrent episode, severe (HCC)   Current Medications:  Current Facility-Administered Medications  Medication Dose Route Frequency Provider Last Rate Last Dose  . alum & mag hydroxide-simeth (MAALOX/MYLANTA) 200-200-20 MG/5ML suspension 30 mL  30 mL Oral Q4H PRN Patriciaann Clan E, PA-C      . amLODipine (NORVASC) tablet 10 mg  10 mg Oral Daily [name], [name], MD   10 mg at 02/24/17 6160  . ARIPiprazole (ABILIFY) tablet 10 mg  10 mg Oral Daily [name], [name], MD   10 mg at 02/24/17 7371  . hydrOXYzine (ATARAX/VISTARIL) tablet 25 mg  25 mg Oral Q6H PRN Patriciaann Clan E, PA-C      . magnesium hydroxide (MILK OF MAGNESIA) suspension 30 mL  30 mL Oral Daily PRN Patriciaann Clan E, PA-C      . nicotine (NICODERM CQ - dosed in mg/24 hours) patch 21 mg  21 mg Transdermal Daily [name], [name], MD   21 mg at 02/23/17 0853  . sertraline (ZOLOFT) tablet 100 mg  100 mg Oral QHS Patriciaann Clan E, PA-C   100 mg at 02/23/17 2021  . traZODone (DESYREL) tablet 50 mg  50 mg Oral QHS PRN Laverle Hobby, PA-C        PTA Medications: Prescriptions Prior to Admission  Medication Sig Dispense Refill Last Dose  . acetaminophen (TYLENOL) 500 MG tablet Take 500 mg by mouth once.   02/18/2017 at Unknown time  . ARIPiprazole (ABILIFY) 2 MG tablet Take 1 tablet (2 mg total) by mouth daily. 30 tablet 0 02/18/2017 at Unknown time  . hydrOXYzine (ATARAX/VISTARIL) 25 MG tablet Take 1 tablet (25 mg total) by mouth every 6 (six) hours as needed for anxiety. 30 tablet 0 02/18/2017 at Unknown time  . risperiDONE (RISPERDAL) 2 MG tablet Take 2 mg by mouth at bedtime.    02/18/2017 at Unknown time  . sertraline (ZOLOFT) 100 MG tablet Take 100 mg by mouth at  bedtime.    02/18/2017 at Unknown time  . traZODone (DESYREL) 50 MG tablet Take 1 tablet (50 mg total) by mouth at bedtime and may repeat dose one time if needed. (Patient taking differently: Take 50 mg by mouth at bedtime as needed for sleep. ) 30 tablet 0 Past Week at Unknown time  . sertraline (ZOLOFT) 25 MG tablet Take 1 tablet (25 mg total) by mouth daily. (Patient not taking: Reported on 02/18/2017) 30 tablet 0 Not Taking at Unknown time    Treatment Modalities: Medication Management, Group therapy, Case management,  1 to 1 session with clinician, Psychoeducation, Recreational therapy.  Patient Stressors: Loss of "sister 1 year ago" Substance abuse Patient Strengths: Average or above average intelligence Capable of independent living Communication skills General fund of knowledge Motivation for treatment/growth Physical Health Supportive family/friends Work Artist for Primary Diagnosis: Schizoaffective Disorder- currently depressed  Long Term Goal(s): Improvement in symptoms so as ready for discharge Short Term Goals: Ability to identify changes in lifestyle to reduce recurrence of condition will improve Ability to verbalize feelings will improve Ability to disclose and discuss suicidal ideas Ability to demonstrate self-control will improve Ability to identify and develop effective coping behaviors will improve Compliance with prescribed medications will improve Ability to identify triggers associated with substance abuse/mental health issues  will improve  Medication Management: Evaluate patient's response, side effects, and tolerance of medication regimen.  Therapeutic Interventions: 1 to 1 sessions, Unit Group sessions and Medication administration.  Evaluation of Outcomes: Adequate for Discharge  Physician Treatment Plan for Secondary Diagnosis: Active Problems:   MDD (major depressive disorder), recurrent episode, severe ([place])  Long Term  Goal(s): Improvement in symptoms so as ready for discharge  Short Term Goals: Ability to identify changes in lifestyle to reduce recurrence of condition will improve Ability to verbalize feelings will improve Ability to disclose and discuss suicidal ideas Ability to demonstrate self-control will improve Ability to identify and develop effective coping behaviors will improve Compliance with prescribed medications will improve Ability to identify triggers associated with substance abuse/mental health issues will improve  Medication Management: Evaluate patient's response, side effects, and tolerance of medication regimen.  Therapeutic Interventions: 1 to 1 sessions, Unit Group sessions and Medication administration.  Evaluation of Outcomes: Adequate for Discharge  RN Treatment Plan for Primary Diagnosis: Schizoaffective Disorder- currently depressed  Long Term Goal(s): Knowledge of disease and therapeutic regimen to maintain health will improve  Short Term Goals: Ability to verbalize feelings will improve and Compliance with prescribed medications will improve  Medication Management: RN will administer medications as ordered by provider, will assess and evaluate patient's response and provide education to patient for prescribed medication. RN will report any adverse and/or side effects to prescribing provider.  Therapeutic Interventions: 1 on 1 counseling sessions, Psychoeducation, Medication administration, Evaluate responses to treatment, Monitor vital signs and CBGs as ordered, Perform/monitor CIWA, COWS, AIMS and Fall Risk screenings as ordered, Perform wound care treatments as ordered.  Evaluation of Outcomes: Adequate for Discharge  LCSW Treatment Plan for Primary Diagnosis: Schizoaffective Disorder- currently depressed  Long Term Goal(s): Safe transition to appropriate next level of care at discharge, Engage patient in therapeutic group addressing interpersonal concerns. Short Term  Goals: Engage patient in aftercare planning with referrals and resources, Increase ability to appropriately verbalize feelings, Identify triggers associated with mental health/substance abuse issues and Increase skills for wellness and recovery  Therapeutic Interventions: Assess for all discharge needs, 1 to 1 time with Social worker, Explore available resources and support systems, Assess for adequacy in community support network, Educate family and significant other(s) on suicide prevention, Complete Psychosocial Assessment, Interpersonal group therapy.  Evaluation of Outcomes: Adequate for Discharge  Progress in Treatment: Attending groups: Yes  Participating in groups: Yes  Taking medication as prescribed: Yes, MD continues to assess for medication changes as needed Toleration medication: Yes, no side effects reported at this time Family/Significant other contact made: No, pt declined contact. Patient understands diagnosis: Continuing to assess Discussing patient identified problems/goals with staff: Yes Medical problems stabilized or resolved: Yes Denies suicidal/homicidal ideation: Yes Issues/concerns per patient self-inventory: None Other: N/A  New problem(s) identified: None identified at this time.   New Short Term/Long Term Goal(s): None identified at this time.   Discharge Plan or Barriers: Pt will return home and follow-[facility] with [facility]  Reason for Continuation of Hospitalization: None identified at this time.   Estimated Length of Stay: 3-5 days  Attendees: Patient: 02/24/2017 10:56 AM  Physician: Dr. Parke Poisson 02/24/2017 10:56 AM  Nursing: Geraldo Docker, RN 02/24/2017 10:56 AM  RN Care Manager: Lars Pinks, RN 02/24/2017 10:56 AM  Social Worker: Matthew Saras, LCSWA; Adriana Reams, [place], LCSW 02/24/2017 10:56 AM  Recreational Therapist:  02/24/2017 10:56 AM  Other: Lindell Spar, NP; May Agustin 02/24/2017 10:56 AM  Other:  02/24/2017  10:56 AM  Other:  02/24/2017 10:56 AM   Scribe for Treatment Team: Adriana Reams, LCSW Clinical Social Work [phone]    10:56 AM

## 2017-02-24 NOTE — Progress Notes (Signed)
  [facility] Adult Case Management Discharge Plan :  Will you be returning to the same living situation after discharge:  No. Pt plans to go live with her mother-in-law At discharge, do you have transportation home?: Yes,  Pt provided with bus pass Do you have the ability to pay for your medications: Yes,  Pt provided with prescriptions  Release of information consent forms completed and in the chart;  Patient's signature needed at discharge.  Patient to Follow up at: Follow-up Information    [facility] Follow up.   Specialty:  Behavioral Health Why:  Please go for a walk-in appointment within 1-3 days of discharge to be reestablished for outpatient services. Walk-in hours are Mon-Fri 8 AM-3 PM. Please arrive as early as possible to be sure that you are seen. Contact information: [place] [address] [phone]           Next level of care provider has access to [place] and Suicide Prevention discussed: Yes,  with Pt; declined family contact  Have you used any form of tobacco in the last 30 days? (Cigarettes, Smokeless Tobacco, Cigars, and/or Pipes): Yes  Has patient been referred to the Quitline?: Patient refused referral  Patient has been referred for addiction treatment: Yes  Gladstone Lighter 02/24/2017, 11:01 AM

## 2017-02-24 NOTE — Progress Notes (Signed)
Recreation Therapy Notes  Date: 02/24/17 Time: 0930 Location: 300 Hall Dayroom  Group Topic: Stress Management  Goal Area(s) Addresses:  Patient will verbalize importance of using healthy stress management.  Patient will identify positive emotions associated with healthy stress management.   Intervention: Stress Management  Activity :  Letting Go.  LRT introduced the stress management technique of meditation.  LRT played a meditation on the importance of letting go of the past and things that hold [place] back.  Patients were to follow along as the meditation played to participate in the activity.    Education:  Stress Management, Discharge Planning.   Education Outcome: Acknowledges edcuation/In group clarification offered/Needs additional education  Clinical Observations/Feedback: Pt did not attend group.    Victorino Sparrow, LRT/CTRS         Victorino Sparrow A 02/24/2017 11:57 AM

## 2017-06-25 ENCOUNTER — Encounter (HOSPITAL_COMMUNITY): Payer: Self-pay | Admitting: Emergency Medicine

## 2017-06-25 ENCOUNTER — Emergency Department (HOSPITAL_COMMUNITY)
Admission: EM | Admit: 2017-06-25 | Discharge: 2017-06-25 | Disposition: A | Discharge status: Home / Self Care | Payer: Medicaid Other | Attending: Emergency Medicine | Admitting: Emergency Medicine

## 2017-06-25 DIAGNOSIS — R51 Headache: Secondary | ICD-10-CM | POA: Insufficient documentation

## 2017-06-25 DIAGNOSIS — Z5321 Procedure and treatment not carried out due to patient leaving prior to being seen by health care provider: Secondary | ICD-10-CM | POA: Diagnosis not present

## 2017-06-25 LAB — BASIC METABOLIC PANEL
ANION GAP: 5 (ref 5–15)
BUN: 11 mg/dL (ref 6–20)
CALCIUM: 8.4 mg/dL — AB (ref 8.9–10.3)
CHLORIDE: 112 mmol/L — AB (ref 101–111)
CO2: 24 mmol/L (ref 22–32)
CREATININE: 0.76 mg/dL (ref 0.44–1.00)
GFR calc Af Amer: >60 mL/min (ref >60)
GFR calc non Af Amer: >60 mL/min (ref >60)
Glucose, Bld: 85 mg/dL (ref 65–99)
Potassium: 4 mmol/L (ref 3.5–5.1)
Sodium: 141 mmol/L (ref 135–145)

## 2017-06-25 LAB — CBC
HCT: 30.3 % — ABNORMAL LOW (ref 36.0–46.0)
HEMOGLOBIN: 9.5 g/dL — AB (ref 12.0–15.0)
MCH: 26.2 pg (ref 26.0–34.0)
MCHC: 31.4 g/dL (ref 30.0–36.0)
MCV: 83.7 fL (ref 78.0–100.0)
Platelets: 419 10*3/uL — ABNORMAL HIGH (ref 150–400)
RBC: 3.62 MIL/uL — ABNORMAL LOW (ref 3.87–5.11)
RDW: 17 % — ABNORMAL HIGH (ref 11.5–15.5)
WBC: 6.1 10*3/uL (ref 4.0–10.5)

## 2017-06-25 LAB — I-STAT BETA HCG BLOOD, ED (MC, WL, AP ONLY): I-stat hCG, quantitative: <5 m[IU]/mL (ref <5)

## 2017-06-25 LAB — I-STAT TROPONIN, ED: Troponin i, poc: 0.01 ng/mL (ref 0.00–0.08)

## 2017-06-25 LAB — CBG MONITORING, ED: GLUCOSE-CAPILLARY: 84 mg/dL (ref 65–99)

## 2017-06-25 NOTE — ED Triage Notes (Signed)
Pt called from triage with no answer 

## 2017-06-25 NOTE — ED Notes (Signed)
Patient repeat EKG done and given to Dr Zenia Resides, looks same as first EKG.  IV removed and patient put in lobby to wait for treatment room.

## 2017-06-25 NOTE — ED Triage Notes (Addendum)
Per [facility] patient comes in for syncopal episode while walking from her home to her grandmother's house which is not usually difficult for her.  Patient sat down on side walk due to feeling weak and then woke up lying on side walk.  Patient has 18g left AC and given NS 557ml in route.  \  Patient adds that she had headache this morning and got worse while she was walking. Patient c/o being dizzy now.

## 2017-07-28 ENCOUNTER — Encounter (HOSPITAL_COMMUNITY): Payer: Self-pay | Admitting: *Deleted

## 2017-07-28 ENCOUNTER — Emergency Department (HOSPITAL_COMMUNITY)
Admission: EM | Admit: 2017-07-28 | Discharge: 2017-07-29 | Disposition: A | Discharge status: Other Acute Inpatient Hospital | Payer: Medicaid Other | Attending: Emergency Medicine | Admitting: Emergency Medicine

## 2017-07-28 DIAGNOSIS — Z79899 Other long term (current) drug therapy: Secondary | ICD-10-CM | POA: Diagnosis not present

## 2017-07-28 DIAGNOSIS — F251 Schizoaffective disorder, depressive type: Secondary | ICD-10-CM | POA: Diagnosis present

## 2017-07-28 DIAGNOSIS — R45851 Suicidal ideations: Secondary | ICD-10-CM | POA: Insufficient documentation

## 2017-07-28 DIAGNOSIS — F17210 Nicotine dependence, cigarettes, uncomplicated: Secondary | ICD-10-CM | POA: Diagnosis not present

## 2017-07-28 DIAGNOSIS — F70 Mild intellectual disabilities: Secondary | ICD-10-CM | POA: Diagnosis not present

## 2017-07-28 DIAGNOSIS — R441 Visual hallucinations: Secondary | ICD-10-CM | POA: Diagnosis present

## 2017-07-28 DIAGNOSIS — F250 Schizoaffective disorder, bipolar type: Secondary | ICD-10-CM | POA: Diagnosis not present

## 2017-07-28 DIAGNOSIS — R443 Hallucinations, unspecified: Secondary | ICD-10-CM

## 2017-07-28 DIAGNOSIS — R440 Auditory hallucinations: Secondary | ICD-10-CM | POA: Insufficient documentation

## 2017-07-28 DIAGNOSIS — I10 Essential (primary) hypertension: Secondary | ICD-10-CM | POA: Insufficient documentation

## 2017-07-28 DIAGNOSIS — Z046 Encounter for general psychiatric examination, requested by authority: Secondary | ICD-10-CM | POA: Diagnosis not present

## 2017-07-28 HISTORY — DX: Schizoaffective disorder, unspecified: F25.9

## 2017-07-28 HISTORY — DX: Mild intellectual disabilities: F70

## 2017-07-28 LAB — COMPREHENSIVE METABOLIC PANEL
ALBUMIN: 3.8 g/dL (ref 3.5–5.0)
ALT: 12 U/L — ABNORMAL LOW (ref 14–54)
ANION GAP: 9 (ref 5–15)
AST: 19 U/L (ref 15–41)
Alkaline Phosphatase: 74 U/L (ref 38–126)
BUN: 19 mg/dL (ref 6–20)
CHLORIDE: 106 mmol/L (ref 101–111)
CO2: 26 mmol/L (ref 22–32)
Calcium: 8.7 mg/dL — ABNORMAL LOW (ref 8.9–10.3)
Creatinine, Ser: 0.78 mg/dL (ref 0.44–1.00)
GFR calc Af Amer: >60 mL/min (ref >60)
GFR calc non Af Amer: >60 mL/min (ref >60)
GLUCOSE: 82 mg/dL (ref 65–99)
POTASSIUM: 3.4 mmol/L — AB (ref 3.5–5.1)
SODIUM: 141 mmol/L (ref 135–145)
Total Bilirubin: 1 mg/dL (ref 0.3–1.2)
Total Protein: 7.6 g/dL (ref 6.5–8.1)

## 2017-07-28 LAB — CBC
HEMATOCRIT: 36 % (ref 36.0–46.0)
HEMOGLOBIN: 11.5 g/dL — AB (ref 12.0–15.0)
MCH: 26.9 pg (ref 26.0–34.0)
MCHC: 31.9 g/dL (ref 30.0–36.0)
MCV: 84.3 fL (ref 78.0–100.0)
Platelets: 410 10*3/uL — ABNORMAL HIGH (ref 150–400)
RBC: 4.27 MIL/uL (ref 3.87–5.11)
RDW: 15.9 % — ABNORMAL HIGH (ref 11.5–15.5)
WBC: 5.1 10*3/uL (ref 4.0–10.5)

## 2017-07-28 LAB — RAPID URINE DRUG SCREEN, HOSP PERFORMED
Amphetamines: NOT DETECTED
BARBITURATES: NOT DETECTED
BENZODIAZEPINES: NOT DETECTED
Cocaine: NOT DETECTED
Opiates: NOT DETECTED
TETRAHYDROCANNABINOL: POSITIVE — AB

## 2017-07-28 LAB — ETHANOL: Alcohol, Ethyl (B): <10 mg/dL (ref <10)

## 2017-07-28 LAB — ACETAMINOPHEN LEVEL

## 2017-07-28 LAB — PREGNANCY, URINE: PREG TEST UR: NEGATIVE

## 2017-07-28 LAB — SALICYLATE LEVEL: Salicylate Lvl: <7 mg/dL (ref 2.8–30.0)

## 2017-07-28 MED ORDER — ARIPIPRAZOLE 10 MG PO TABS
10.0000 mg | ORAL_TABLET | Freq: Every day | ORAL | Status: DC
  Administered 2017-07-28 – 2017-07-29 (×2): 10 mg via ORAL
  Filled 2017-07-28 (×2): qty 1

## 2017-07-28 MED ORDER — HYDROXYZINE HCL 25 MG PO TABS: 25.0000 mg | ORAL_TABLET | Freq: Four times a day (QID) | ORAL | Status: DC | PRN

## 2017-07-28 MED ORDER — SERTRALINE HCL 50 MG PO TABS
100.0000 mg | ORAL_TABLET | Freq: Every day | ORAL | Status: DC
  Administered 2017-07-28: 100 mg via ORAL
  Filled 2017-07-28: qty 2

## 2017-07-28 MED ORDER — HYDROCHLOROTHIAZIDE 25 MG PO TABS
25.0000 mg | ORAL_TABLET | Freq: Every day | ORAL | Status: DC
Start: 2017-07-28 — End: 2017-07-29
  Administered 2017-07-28 – 2017-07-29 (×2): 25 mg via ORAL
  Filled 2017-07-28 (×2): qty 1

## 2017-07-28 MED ORDER — AMLODIPINE BESYLATE 5 MG PO TABS
10.0000 mg | ORAL_TABLET | Freq: Every day | ORAL | Status: DC
  Administered 2017-07-28 – 2017-07-29 (×2): 10 mg via ORAL
  Filled 2017-07-28 (×2): qty 2

## 2017-07-28 MED ORDER — TRAZODONE HCL 50 MG PO TABS
150.0000 mg | ORAL_TABLET | Freq: Every day | ORAL | Status: DC
  Administered 2017-07-28: 21:00:00 150 mg via ORAL
  Filled 2017-07-28: qty 1

## 2017-07-28 MED ORDER — LAMOTRIGINE 25 MG PO TABS
25.0000 mg | ORAL_TABLET | Freq: Two times a day (BID) | ORAL | Status: DC
  Administered 2017-07-28 – 2017-07-29 (×3): 25 mg via ORAL
  Filled 2017-07-28 (×3): qty 1

## 2017-07-28 NOTE — BH Assessment (Addendum)
Assessment Note  Sarah [name] is an 41 y.o. female that presents this date voluntary with thoughts of self harm and a plan to overdose. Patient states she was leaving her residence earlier this date and was in route to a local bus stop when she started "seeing ghosts" and hearing voices that were command in nature telling her to harm herself. Patient stated she had a plan to overdose on her prescription medications due to [facility]. Patient states she has been receiving services from [facility] "for years" for medication management but reports she has not seen that provider in the last seven months. Patient states she has been out of her medication/s "for a few months" but is vague in reference to time frame. Patient stated they contacted [facility] last week to inquire about refills with provider stating they needed to come in to be reassessed. Patient stated they intended to see that provider this week but her AH became "so bad" this date that "she needed to come to the hospital." Patient denies any H/I but admits to ongoing Cannabis use. Patient tested positive this date for [facility] and reports she uses 1 gram to three times a week with last use two days ago reporting she used about 1/2 gram. Per record review, patient has had multiple admissions to [facility] with the last one on 02/18/17 when patient attempted to overdose on medications. Patient is oriented to time/place and speaks in a low soft voice. Patient does not appear to be currently responding to internal stimuli but reports ongoing AH at the time  of the assessment. Per record review, patient was spotted sitting at a intersection this date stating she is hearing voices that are telling her to kill herself. [facility] transported patient to hospital. Patient reported, not knowing the triggers associated with her S/I. Patient reported two previous suicide attempts. Patient reported hearing voices, telling her to hurt herself and others in the past. Patient reported, she has not  heard voices in a while. Patient denied, HI and access to weapons or self-injurious behaviors. Patient  reported she has attempted suicide in the past. Patient has been hospitalized multiple times in the past and reported that she does not have a mental health provider at this time but shared that she has received treatment through [facility] in the past. Patient is endorsing multiple depressive symptoms but did not report any issues with her sleep or appetite. Patient did not report any pending criminal charges or upcoming court dates. Patient did not report any physical, sexual or emotional abuse. Patient is requesting inpatient treatment for medication management and stabilization. Case was staffed with Reita Cliche DNP who recommended a inpatient admission as appropriate bed placement is investigated.  Diagnosis:Schizoaffective disorder, bipolar type, MDD (major depressive disorder), recurrent episode, severe, Cannabis use    Past Medical History:  Past Medical History:  Diagnosis Date  . Anemia   . Anxiety   . Depression   . Elevated bilirubin    slight elevation at 1.3  . Hypertension   . Hypokalemia   . Intracranial hypertension   . Migraines   . Mild intellectual disability   . Schizoaffective disorder [facility])     Past Surgical History:  Procedure Laterality Date  . NO PAST SURGERIES      Family History:  Family History  Problem Relation Age of Onset  . Schizophrenia Mother   . Hypertension Mother   . Anesthesia problems Neg Hx   . Malignant hyperthermia Neg Hx   . Pseudochol deficiency  Neg Hx   . Hypotension Neg Hx   . Migraines Neg Hx     Social History:  reports that she has been smoking Cigarettes.  She has a 15.00 pack-year smoking history. She has never used smokeless tobacco. She reports that she uses drugs, including Marijuana. She reports that she does not drink alcohol.  Additional Social History:  Alcohol / Drug Use Pain Medications: See MAR Prescriptions: See MAR Over  the Counter: See MAR History of alcohol / drug use?: Yes Longest period of sobriety (when/how long): Unknown Negative Consequences of Use:  (Currently denies) Withdrawal Symptoms:  (Denies) Substance #1 Name of Substance 1: Cannabis 1 - Age of First Use: 26 1 - Amount (size/oz): 1 gram 1 - Frequency: Two to three times a week 1 - Duration: Pt is vague in reference to time frame 1 - Last Use / Amount: 07/27/17 1 gram  CIWA: CIWA-Ar BP: (!) 145/76 Pulse Rate: (!) 48 COWS:    Allergies:  Allergies  Allergen Reactions  . Penicillins Anaphylaxis    Has patient had a PCN reaction causing immediate rash, facial/tongue/throat swelling, SOB or lightheadedness with hypotension: throat and face swelling. YES Has patient had a PCN reaction causing severe rash involving mucus membranes or skin necrosis: Yes Has patient had a PCN reaction that required hospitalization Yes Has patient had a PCN reaction occurring within the last 10 years: No If all of the above answers are "NO", then may proceed with Cephalosporin use.   . Bee Venom Swelling  . Coconut Oil Hives and Swelling    ALLERGIES TO COCONUT    Home Medications:  (Not in a hospital admission)  OB/GYN Status:  No LMP recorded (lmp unknown).  General Assessment Data Location of Assessment: [facility] TTS Assessment: In system Is this a Tele or Face-to-Face Assessment?: Face-to-Face Is this an Initial Assessment or a Re-assessment for this encounter?: Initial Assessment Marital status: Married [place] name: NA Is patient pregnant?: Unknown Pregnancy Status: Unknown Living Arrangements: Spouse/significant other Can pt return to current living arrangement?: Yes Admission Status: Voluntary Is patient capable of signing voluntary admission?: Yes Referral Source: Self/Family/Friend Insurance type: Medicaid  Medical Screening Exam ([place]) Medical Exam completed: Yes  Crisis Care Plan Living Arrangements:  Spouse/significant other Legal Guardian: Other: (NA) Name of Psychiatrist: [place] Name of Therapist: [facility]  Education Status Is patient currently in school?: No Current Grade:  (NA) Highest grade of school patient has completed: 12 Name of school:  (NA) Contact person:  (NA)  Risk to self with the past 6 months Suicidal Ideation: Yes-Currently Present Has patient been a risk to self within the past 6 months prior to admission? : Yes Suicidal Intent: Yes-Currently Present Has patient had any suicidal intent within the past 6 months prior to admission? : Yes Is patient at risk for suicide?: Yes Suicidal Plan?: Yes-Currently Present Has patient had any suicidal plan within the past 6 months prior to admission? : Yes Specify Current Suicidal Plan: Overdosing Access to Means: Yes Specify Access to Suicidal Means: Pt has medications What has been your use of drugs/alcohol within the last 12 months?: Current use Previous Attempts/Gestures: Yes How many times?: 3 (Per note review) Other Self Harm Risks:  (NA) Triggers for Past Attempts: Unknown Intentional Self Injurious Behavior: None Family Suicide History: No Recent stressful life event(s): Other (Comment) (Pt has not seen her provider) Persecutory voices/beliefs?: No Depression: Yes Depression Symptoms: Feeling worthless/self pity, Fatigue Substance abuse history and/or treatment for substance  abuse?: No Suicide prevention information given to non-admitted patients: Not applicable  Risk to Others within the past 6 months Homicidal Ideation: No Does patient have any lifetime risk of violence toward others beyond the six months prior to admission? : No Thoughts of Harm to Others: No Current Homicidal Intent: No Current Homicidal Plan: No Access to Homicidal Means: No Identified Victim: NA History of harm to others?: No Assessment of Violence: None Noted Violent Behavior Description: NA Does patient have access to  weapons?: No Criminal Charges Pending?: No Does patient have a court date: No Is patient on probation?: No  Psychosis Hallucinations: Auditory, Visual Delusions: None noted  Mental Status Report Appearance/Hygiene: In scrubs Eye Contact: Fair Motor Activity: Freedom of movement Speech: Slow, Soft Level of Consciousness: Quiet/awake Mood: Depressed Affect: Appropriate to circumstance Anxiety Level: Minimal Thought Processes: Coherent, Relevant Judgement: Unimpaired Orientation: Person, Place, Time Obsessive Compulsive Thoughts/Behaviors: None  Cognitive Functioning Concentration: Decreased Memory: Recent Intact, Remote Intact IQ: Average Insight: Fair Impulse Control: Poor Appetite: Fair Weight Loss: 0 Weight Gain: 0 Sleep: No Change Total Hours of Sleep: 8 Vegetative Symptoms: None  ADLScreening [facility] Assessment Services) Patient's cognitive ability adequate to safely complete daily activities?: Yes Patient able to express need for assistance with ADLs?: Yes Independently performs ADLs?: Yes (appropriate for developmental age)  Prior Inpatient Therapy Prior Inpatient Therapy: Yes Prior Therapy Dates: 2018,2017 Prior Therapy Facilty/Provider(s): [facility] Reason for Treatment: MH issues  Prior Outpatient Therapy Prior Outpatient Therapy: Yes Prior Therapy Dates: 2018 Prior Therapy Facilty/Provider(s): [facility] Reason for Treatment: Med mang Does patient have an ACCT team?: No Does patient have Intensive In-House Services?  : No Does patient have [facility] services? : Yes Does patient have P4CC services?: No  ADL Screening (condition at time of admission) Patient's cognitive ability adequate to safely complete daily activities?: Yes Is the patient deaf or have difficulty hearing?: No Does the patient have difficulty seeing, even when wearing glasses/contacts?: No Does the patient have difficulty concentrating, remembering, or making decisions?: No Patient able to  express need for assistance with ADLs?: Yes Does the patient have difficulty dressing or bathing?: No Independently performs ADLs?: Yes (appropriate for developmental age) Does the patient have difficulty walking or climbing stairs?: No Weakness of Legs: None Weakness of Arms/Hands: None  Home Assistive Devices/Equipment Home Assistive Devices/Equipment: None  Therapy Consults (therapy consults require a physician order) PT Evaluation Needed: No OT Evalulation Needed: No SLP Evaluation Needed: No Abuse/Neglect Assessment (Assessment to be complete while patient is alone) Physical Abuse: Denies Verbal Abuse: Denies Sexual Abuse: Denies Exploitation of patient/patient's resources: Denies Self-Neglect: Denies Values / Beliefs Cultural Requests During Hospitalization: None Spiritual Requests During Hospitalization: None Consults Spiritual Care Consult Needed: No Social Work Consult Needed: No Regulatory affairs officer (For Healthcare) Does Patient Have a Medical Advance Directive?: No Would patient like information on creating a medical advance directive?: No - Patient declined    Additional Information 1:1 In Past 12 Months?: No CIRT Risk: No Elopement Risk: No Does patient have medical clearance?: Yes     Disposition: Case was staffed with Reita Cliche DNP who recommended a inpatient admission as appropriate bed placement is investigated. Disposition Initial Assessment Completed for this Encounter: Yes Disposition of Patient: Inpatient treatment program Type of inpatient treatment program: Adult  On Site Evaluation by:   Reviewed with Physician:    Mamie Nick 07/28/2017 1:44 PM

## 2017-07-28 NOTE — Progress Notes (Signed)
07/28/17 1424:  LRT went to pt room and introduced self.  LRT offered pt activities, pt declined.   Victorino Sparrow, LRT/CTRS

## 2017-07-28 NOTE — ED Provider Notes (Signed)
[facility] DEPT Provider Note   CSN: [id number] Arrival date & time: 07/28/17  1050     History   Chief Complaint Chief Complaint  Patient presents with  . Suicidal    HPI Sarah [name] is a 41 y.o. female.  The history is provided by the patient. The history is limited by the condition of the patient (Psychiatric disorder).   Pt was seen at 1100. Per pt: states she called EMS because she has been "seeing things" and "hearing voices" telling her to kill herself. Pt states she was planning on taking pills. Denies HI, no SA. The symptoms have been associated with no other complaints. The patient has a significant history of similar symptoms previously and multiple prior evals for same.      Past Medical History:  Diagnosis Date  . Anemia   . Anxiety   . Depression   . Elevated bilirubin    slight elevation at 1.3  . Hypertension   . Hypokalemia   . Intracranial hypertension   . Migraines   . Mild intellectual disability   . Schizoaffective disorder [facility])     Patient Active Problem List   Diagnosis Date Noted  . MDD (major depressive disorder), recurrent episode, severe ([place]) 02/19/2017  . Schizoaffective disorder, bipolar type ([place]) 01/06/2017  . Mild intellectual disability 01/06/2017  . IIH (idiopathic intracranial hypertension) 01/24/2016  . Cannabis use disorder, moderate, dependence ([place]) 08/18/2015  . Schizoaffective disorder, depressive type ([place]) 01/15/2012    Class: Acute    Past Surgical History:  Procedure Laterality Date  . NO PAST SURGERIES      OB History    No data available       Home Medications    Prior to Admission medications   Medication Sig Start Date End Date Taking? Authorizing Provider  amLODipine (NORVASC) 10 MG tablet Take 1 tablet (10 mg total) by mouth daily. For high blood pressure 02/25/17  Yes Nwoko, Agnes I, NP  ARIPiprazole (ABILIFY) 9.75 MG/1.3ML injection Inject 5.25 mg into the  muscle once.   Yes [provider]  hydrOXYzine (ATARAX/VISTARIL) 25 MG tablet Take 1 tablet (25 mg total) by mouth every 6 (six) hours as needed for anxiety. 02/24/17  Yes Lindell Spar I, NP  sertraline (ZOLOFT) 100 MG tablet Take 1 tablet (100 mg total) by mouth at bedtime. For depression 02/24/17  Yes Lindell Spar I, NP  traZODone (DESYREL) 50 MG tablet Take 1 tablet (50 mg total) by mouth at bedtime as needed for sleep. 02/24/17  Yes Nwoko, Herbert Pun I, NP  ARIPiprazole (ABILIFY) 10 MG tablet Take 1 tablet (10 mg total) by mouth daily. For mood control Patient not taking: Reported on 07/28/2017 02/25/17   Lindell Spar I, NP  nicotine (NICODERM CQ - DOSED IN MG/24 HOURS) 21 mg/24hr patch Place 1 patch (21 mg total) onto the skin daily. For nicotine withdrawal Patient not taking: Reported on 07/28/2017 02/25/17   Encarnacion Slates, NP    Family History Family History  Problem Relation Age of Onset  . Schizophrenia Mother   . Hypertension Mother   . Anesthesia problems Neg Hx   . Malignant hyperthermia Neg Hx   . Pseudochol deficiency Neg Hx   . Hypotension Neg Hx   . Migraines Neg Hx     Social History Social History  Substance Use Topics  . Smoking status: Current Every Day Smoker    Packs/day: 1.00    Years: 15.00    Types:  Cigarettes    Last attempt to quit: 01/02/2017  . Smokeless tobacco: Never Used     Comment: smoked since 41 yrs old  . Alcohol use No     Comment: denied all drug/alcohol use     Allergies   Penicillins; Bee venom; and Coconut oil   Review of Systems Review of Systems  Unable to perform ROS: Psychiatric disorder     Physical Exam Updated Vital Signs BP (!) 145/76 (BP Location: Right Arm)   Pulse (!) 48   Temp 98.2 F (36.8 C) (Oral)   Resp 18   LMP  (LMP Unknown)   SpO2 98%   Physical Exam 1105: Physical examination:  Nursing notes reviewed; Vital signs and O2 SAT reviewed;  Constitutional: Well developed, Well nourished, Well hydrated,  In no acute distress; Head:  Normocephalic, atraumatic; Eyes: EOMI, PERRL, No scleral icterus; ENMT: Mouth and pharynx normal, Mucous membranes moist; Neck: Supple, Full range of motion; Cardiovascular: Regular rate and rhythm; Respiratory: Breath sounds clear, No wheezes.  Speaking full sentences with ease, Normal respiratory effort/excursion; Chest: No deformity, Movement normal; Abdomen: Nondistended; Extremities: No deformity.; Neuro: AA&Ox3, Major CN grossly intact.  Speech clear. No gross focal motor deficits in extremities. Climbs on and off stretcher easily by herself. Gait steady.; Skin: Color normal, Warm, Dry.; Psych:  Affect flat.    ED Treatments / Results  Labs (all labs ordered are listed, but only abnormal results are displayed)   EKG  EKG Interpretation None       Radiology    Procedures Procedures (including critical care time)  Medications Ordered in ED Medications - No data to display   Initial Impression / Assessment and Plan / ED Course  I have reviewed the triage vital signs and the nursing notes.  Pertinent labs & imaging results that were available during my care of the patient were reviewed by me and considered in my medical decision making (see chart for details).  MDM Reviewed: previous chart, nursing note and vitals Reviewed previous: labs Interpretation: labs   Results for orders placed or performed during the hospital encounter of 07/28/17  Comprehensive metabolic panel  Result Value Ref Range   Sodium 141 135 - 145 mmol/L   Potassium 3.4 (L) 3.5 - 5.1 mmol/L   Chloride 106 101 - 111 mmol/L   CO2 26 22 - 32 mmol/L   Glucose, Bld 82 65 - 99 mg/dL   BUN 19 6 - 20 mg/dL   Creatinine, Ser 0.78 0.44 - 1.00 mg/dL   Calcium 8.7 (L) 8.9 - 10.3 mg/dL   Total Protein 7.6 6.5 - 8.1 g/dL   Albumin 3.8 3.5 - 5.0 g/dL   AST 19 15 - 41 U/L   ALT 12 (L) 14 - 54 U/L   Alkaline Phosphatase 74 38 - 126 U/L   Total Bilirubin 1.0 0.3 - 1.2 mg/dL   GFR  calc non Af Amer >60 >60 mL/min   GFR calc Af Amer >60 >60 mL/min   Anion gap 9 5 - 15  Ethanol  Result Value Ref Range   Alcohol, Ethyl (B) <08 <67 mg/dL  Salicylate level  Result Value Ref Range   Salicylate Lvl <6.1 2.8 - 30.0 mg/dL  cbc  Result Value Ref Range   WBC 5.1 4.0 - 10.5 K/uL   RBC 4.27 3.87 - 5.11 MIL/uL   Hemoglobin 11.5 (L) 12.0 - 15.0 g/dL   HCT 36.0 36.0 - 46.0 %   MCV 84.3 78.0 - 100.0  fL   MCH 26.9 26.0 - 34.0 pg   MCHC 31.9 30.0 - 36.0 g/dL   RDW 15.9 (H) 11.5 - 15.5 %   Platelets 410 (H) 150 - 400 K/uL  Rapid urine drug screen (hospital performed)  Result Value Ref Range   Opiates NONE DETECTED NONE DETECTED   Cocaine NONE DETECTED NONE DETECTED   Benzodiazepines NONE DETECTED NONE DETECTED   Amphetamines NONE DETECTED NONE DETECTED   Tetrahydrocannabinol POSITIVE (A) NONE DETECTED   Barbiturates NONE DETECTED NONE DETECTED  Pregnancy, urine  Result Value Ref Range   Preg Test, Ur NEGATIVE NEGATIVE  Acetaminophen level  Result Value Ref Range   Acetaminophen (Tylenol), Serum <10 (L) 10 - 30 ug/mL     1240:  TTS eval pending.   1400:  TTS has evaluated pt: inpt treatment recommended, placement pending. Holding orders written.    Final Clinical Impressions(s) / ED Diagnoses   Final diagnoses:  None    New Prescriptions New Prescriptions   No medications on file      Francine Graven, DO 07/28/17 1400

## 2017-07-28 NOTE — ED Notes (Signed)
Bed: [facility] Expected date:  Expected time:  Means of arrival:  Comments: 47

## 2017-07-28 NOTE — BH Assessment (Signed)
[facility] Assessment Progress Note  Case was staffed with Lord DNP who recommended a inpatient admission as appropriate bed placement is investigated.         

## 2017-07-28 NOTE — ED Triage Notes (Signed)
Patient was spotted sitting at a intersection. Patient states she is hearing voices that are telling her to kill herself. [facility] transported patient to hospital.

## 2017-07-28 NOTE — ED Notes (Signed)
Pt admitted to room #43. Pt endorsing SI-verbally contracts for safety. Pt denies HI. Pt endorsing AH. Pt reports hearing hearing voices telling her to "hurt myself." Pt denies VH. Pt does not identify any current stressors. Pt reports she has been complaint with her medication regimen. Special checks q 15 mins in place for safety, Video monitoring in place. Will continue to monitor.

## 2017-07-29 ENCOUNTER — Encounter (HOSPITAL_COMMUNITY): Payer: Self-pay

## 2017-07-29 ENCOUNTER — Inpatient Hospital Stay (HOSPITAL_COMMUNITY)
Admission: AD | Admit: 2017-07-29 | Discharge: 2017-08-01 | DRG: 885 | Disposition: A | Discharge status: Home / Self Care | Payer: Medicaid Other | Source: Intra-hospital | Attending: Psychiatry | Admitting: Psychiatry

## 2017-07-29 DIAGNOSIS — I10 Essential (primary) hypertension: Secondary | ICD-10-CM | POA: Diagnosis present

## 2017-07-29 DIAGNOSIS — F251 Schizoaffective disorder, depressive type: Secondary | ICD-10-CM

## 2017-07-29 DIAGNOSIS — Z91128 Patient's intentional underdosing of medication regimen for other reason: Secondary | ICD-10-CM

## 2017-07-29 DIAGNOSIS — Z915 Personal history of self-harm: Secondary | ICD-10-CM | POA: Diagnosis not present

## 2017-07-29 DIAGNOSIS — D649 Anemia, unspecified: Secondary | ICD-10-CM | POA: Diagnosis present

## 2017-07-29 DIAGNOSIS — F419 Anxiety disorder, unspecified: Secondary | ICD-10-CM | POA: Diagnosis present

## 2017-07-29 DIAGNOSIS — G932 Benign intracranial hypertension: Secondary | ICD-10-CM | POA: Diagnosis present

## 2017-07-29 DIAGNOSIS — T43596A Underdosing of other antipsychotics and neuroleptics, initial encounter: Secondary | ICD-10-CM | POA: Diagnosis present

## 2017-07-29 DIAGNOSIS — X58XXXA Exposure to other specified factors, initial encounter: Secondary | ICD-10-CM | POA: Diagnosis present

## 2017-07-29 DIAGNOSIS — Z818 Family history of other mental and behavioral disorders: Secondary | ICD-10-CM

## 2017-07-29 DIAGNOSIS — Z880 Allergy status to penicillin: Secondary | ICD-10-CM

## 2017-07-29 DIAGNOSIS — F17210 Nicotine dependence, cigarettes, uncomplicated: Secondary | ICD-10-CM | POA: Diagnosis present

## 2017-07-29 DIAGNOSIS — G4700 Insomnia, unspecified: Secondary | ICD-10-CM | POA: Diagnosis present

## 2017-07-29 DIAGNOSIS — F70 Mild intellectual disabilities: Secondary | ICD-10-CM | POA: Diagnosis present

## 2017-07-29 DIAGNOSIS — R441 Visual hallucinations: Secondary | ICD-10-CM | POA: Diagnosis not present

## 2017-07-29 DIAGNOSIS — Z79899 Other long term (current) drug therapy: Secondary | ICD-10-CM | POA: Diagnosis not present

## 2017-07-29 DIAGNOSIS — R450 Nervousness: Secondary | ICD-10-CM | POA: Diagnosis not present

## 2017-07-29 DIAGNOSIS — Z91030 Bee allergy status: Secondary | ICD-10-CM | POA: Diagnosis not present

## 2017-07-29 DIAGNOSIS — F259 Schizoaffective disorder, unspecified: Secondary | ICD-10-CM | POA: Diagnosis not present

## 2017-07-29 DIAGNOSIS — F1910 Other psychoactive substance abuse, uncomplicated: Secondary | ICD-10-CM | POA: Diagnosis not present

## 2017-07-29 DIAGNOSIS — R45851 Suicidal ideations: Secondary | ICD-10-CM | POA: Diagnosis not present

## 2017-07-29 MED ORDER — LAMOTRIGINE 25 MG PO TABS
25.0000 mg | ORAL_TABLET | Freq: Two times a day (BID) | ORAL | Status: DC
  Administered 2017-07-29 – 2017-08-01 (×6): 25 mg via ORAL
  Filled 2017-07-29 (×11): qty 1

## 2017-07-29 MED ORDER — MAGNESIUM HYDROXIDE 400 MG/5ML PO SUSP: 30.0000 mL | Freq: Every day | ORAL | Status: DC | PRN

## 2017-07-29 MED ORDER — ACETAMINOPHEN 325 MG PO TABS: 650.0000 mg | ORAL_TABLET | Freq: Four times a day (QID) | ORAL | Status: DC | PRN

## 2017-07-29 MED ORDER — SERTRALINE HCL 100 MG PO TABS
100.0000 mg | ORAL_TABLET | Freq: Every day | ORAL | Status: DC
  Administered 2017-07-29 – 2017-07-31 (×3): 100 mg via ORAL
  Filled 2017-07-29 (×3): qty 1
  Filled 2017-07-29: qty 2
  Filled 2017-07-29 (×2): qty 1

## 2017-07-29 MED ORDER — TRAZODONE HCL 150 MG PO TABS
150.0000 mg | ORAL_TABLET | Freq: Every day | ORAL | Status: DC
  Administered 2017-07-29 – 2017-07-31 (×3): 150 mg via ORAL
  Filled 2017-07-29 (×6): qty 1

## 2017-07-29 MED ORDER — AMLODIPINE BESYLATE 10 MG PO TABS
10.0000 mg | ORAL_TABLET | Freq: Every day | ORAL | Status: DC
  Administered 2017-07-30 – 2017-08-01 (×3): 10 mg via ORAL
  Filled 2017-07-29 (×2): qty 1
  Filled 2017-07-29: qty 2
  Filled 2017-07-29 (×3): qty 1

## 2017-07-29 MED ORDER — TRAZODONE HCL 50 MG PO TABS: 50.0000 mg | ORAL_TABLET | Freq: Every evening | ORAL | Status: DC | PRN

## 2017-07-29 MED ORDER — HYDROXYZINE HCL 25 MG PO TABS: 25.0000 mg | ORAL_TABLET | Freq: Four times a day (QID) | ORAL | Status: DC | PRN

## 2017-07-29 MED ORDER — ARIPIPRAZOLE 10 MG PO TABS
10.0000 mg | ORAL_TABLET | Freq: Every day | ORAL | Status: DC
  Administered 2017-07-30 – 2017-08-01 (×3): 10 mg via ORAL
  Filled 2017-07-29 (×5): qty 1

## 2017-07-29 MED ORDER — ALUM & MAG HYDROXIDE-SIMETH 200-200-20 MG/5ML PO SUSP: 30.0000 mL | ORAL | Status: DC | PRN

## 2017-07-29 MED ORDER — HYDROCHLOROTHIAZIDE 25 MG PO TABS
25.0000 mg | ORAL_TABLET | Freq: Every day | ORAL | Status: DC
  Administered 2017-07-30 – 2017-08-01 (×3): 25 mg via ORAL
  Filled 2017-07-29 (×6): qty 1

## 2017-07-29 NOTE — ED Notes (Signed)
[facility] transport on unit to transfer pt to [facility] Adult unit per MD order. Pt signed for personal property and property given to [facility] transport for transfer. Pt signed e-signature. Ambulatory off unit.

## 2017-07-29 NOTE — BH Assessment (Addendum)
[facility] Assessment Progress Note  Pt has been accepted to [facility] 508-2. Bed is ready. Call report to [phone]. Support paperwork signed and faxed to [facility]. Pt's RN, Caryl Pina, notified.   Kenna Gilbert. Lovena Le, [place], [place], [facility] Counselor

## 2017-07-29 NOTE — Consult Note (Signed)
Sarah [name] Face-to-Face Psychiatry Consult   Reason for Consult:  Suicide risk assessment/psychosis Referring Physician:  EDP Patient Identification: Sarah [name] MRN:  [id number] Principal Diagnosis: Schizoaffective disorder, depressive type ([place]) Diagnosis:   Patient Active Problem List   Diagnosis Date Noted  . MDD (major depressive disorder), recurrent episode, severe ([place]) [F33.2] 02/19/2017  . Schizoaffective disorder, bipolar type ([place]) [F25.0] 01/06/2017  . 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 IIH (idiopathic intracranial hypertension) [G93.2] 01/24/2016  . Cannabis use disorder, moderate, dependence ([place]) [F12.20] 08/18/2015  . Schizoaffective disorder, depressive type ([place]) [F25.1] 01/15/2012    Class: Acute    Total Time spent with patient: 15 minutes  Subjective:   Sarah [name] is a 41 y.o. female patient admitted with for suicidal ideation and AVH.   HPI:  Sarah [name] reports command auditory hallucinations for several years and worsening for the past 2 days. She hears voices that tell her to kill herself. She is sometimes able to distract herself by listening to music or sitting in a quiet room. She receives [employer]. She reports that her last injection was a month ago. She sometimes has worsening AH right before she is due for her next injection. She denies hearing voices at this time or thoughts that people are going to harm her. She feels safe in the hospital.   Past Psychiatric History: Schizoaffective disorder, depressed type.  Risk to Self: Denies SI but reports intermittent CAH to harm self.  Risk to Others: Homicidal Ideation: No Thoughts of Harm to Others: No Current Homicidal Intent: No Current Homicidal Plan: No Access to Homicidal Means: No Identified Victim: NA History of harm to others?: No Assessment of Violence: None Noted Violent Behavior Description: NA Does patient have access to weapons?: No Criminal Charges Pending?:  No Does patient have a court date: No Prior Inpatient Therapy: Prior Inpatient Therapy: Yes Prior Therapy Dates: 2018,2017 Prior Therapy Facilty/Provider(s): [facility] Reason for Treatment: MH issues Prior Outpatient Therapy: Prior Outpatient Therapy: Yes Prior Therapy Dates: 2018 Prior Therapy Facilty/Provider(s): [facility] Reason for Treatment: Med mang Does patient have an ACCT team?: No Does patient have Intensive In-House Services?  : No Does patient have [facility] services? : Yes Does patient have P4CC services?: No  Past Medical History:  Past Medical History:  Diagnosis Date  . Anemia   . Anxiety   . Depression   . Elevated bilirubin    slight elevation at 1.3  . Hypertension   . Hypokalemia   . Intracranial hypertension   . Migraines   . Mild intellectual disability   . Schizoaffective disorder [facility])     Past Surgical History:  Procedure Laterality Date  . NO PAST SURGERIES     Family History:  Family History  Problem Relation Age of Onset  . Schizophrenia Mother   . Hypertension Mother   . Anesthesia problems Neg Hx   . Malignant hyperthermia Neg Hx   . Pseudochol deficiency Neg Hx   . Hypotension Neg Hx   . Migraines Neg Hx    Family Psychiatric  History: Mother with schizophrenia.  Social History:  History  Alcohol Use No    Comment: denied all drug/alcohol use     History  Drug Use  . Types: Marijuana    Comment: denied all drug/alcohol use    Social History   Social History  . Marital status: Single    Spouse name: N/A  . Number of children: 0  . Years of education: 74  Occupational History  . Unemployed    Social History Main Topics  . Smoking status: Current Every Day Smoker    Packs/day: 1.00    Years: 15.00    Types: Cigarettes    Last attempt to quit: 01/02/2017  . Smokeless tobacco: Never Used     Comment: smoked since 41 yrs old  . Alcohol use No     Comment: denied all drug/alcohol use  . Drug use: Yes    Types: Marijuana      Comment: denied all drug/alcohol use  . Sexual activity: Yes    Birth control/ protection: None   Other Topics Concern  . None   Social History Narrative   Lives with fiance, Erlene Quan   Caffeine use: Soda: 1 soda daily   No coffee   No tea   Additional Social History: N/A    Allergies:   Allergies  Allergen Reactions  . Penicillins Anaphylaxis    Has patient had a PCN reaction causing immediate rash, facial/tongue/throat swelling, SOB or lightheadedness with hypotension: throat and face swelling. YES Has patient had a PCN reaction causing severe rash involving mucus membranes or skin necrosis: Yes Has patient had a PCN reaction that required hospitalization Yes Has patient had a PCN reaction occurring within the last 10 years: No If all of the above answers are "NO", then may proceed with Cephalosporin use.   . Bee Venom Swelling  . Coconut Oil Hives and Swelling    ALLERGIES TO COCONUT    Labs:  Results for orders placed or performed during the hospital encounter of 07/28/17 (from the past 48 hour(s))  Comprehensive metabolic panel     Status: Abnormal   Collection Time: 07/28/17 10:58 AM  Result Value Ref Range   Sodium 141 135 - 145 mmol/L   Potassium 3.4 (L) 3.5 - 5.1 mmol/L   Chloride 106 101 - 111 mmol/L   CO2 26 22 - 32 mmol/L   Glucose, Bld 82 65 - 99 mg/dL   BUN 19 6 - 20 mg/dL   Creatinine, Ser 0.78 0.44 - 1.00 mg/dL   Calcium 8.7 (L) 8.9 - 10.3 mg/dL   Total Protein 7.6 6.5 - 8.1 g/dL   Albumin 3.8 3.5 - 5.0 g/dL   AST 19 15 - 41 U/L   ALT 12 (L) 14 - 54 U/L   Alkaline Phosphatase 74 38 - 126 U/L   Total Bilirubin 1.0 0.3 - 1.2 mg/dL   GFR calc non Af Amer >60 >60 mL/min   GFR calc Af Amer >60 >60 mL/min    Comment: (NOTE) The eGFR has been calculated using the CKD EPI equation. This calculation has not been validated in all clinical situations. eGFR's persistently <60 mL/min signify possible Chronic Kidney Disease.    Anion gap 9 5 - 15  Ethanol      Status: None   Collection Time: 07/28/17 10:58 AM  Result Value Ref Range   Alcohol, Ethyl (B) <10 <10 mg/dL    Comment:        LOWEST DETECTABLE LIMIT FOR SERUM ALCOHOL IS 10 mg/dL FOR MEDICAL PURPOSES ONLY   Salicylate level     Status: None   Collection Time: 07/28/17 10:58 AM  Result Value Ref Range   Salicylate Lvl <9.8 2.8 - 30.0 mg/dL  cbc     Status: Abnormal   Collection Time: 07/28/17 10:58 AM  Result Value Ref Range   WBC 5.1 4.0 - 10.5 K/uL    Comment: WHITE COUNT  CONFIRMED ON SMEAR ADJUSTED FOR NUCLEATED RBC'S RARE NRBCs    RBC 4.27 3.87 - 5.11 MIL/uL   Hemoglobin 11.5 (L) 12.0 - 15.0 g/dL   HCT 36.0 36.0 - 46.0 %   MCV 84.3 78.0 - 100.0 fL   MCH 26.9 26.0 - 34.0 pg   MCHC 31.9 30.0 - 36.0 g/dL   RDW 15.9 (H) 11.5 - 15.5 %   Platelets 410 (H) 150 - 400 K/uL  Acetaminophen level     Status: Abnormal   Collection Time: 07/28/17 11:09 AM  Result Value Ref Range   Acetaminophen (Tylenol), Serum <10 (L) 10 - 30 ug/mL    Comment:        THERAPEUTIC CONCENTRATIONS VARY SIGNIFICANTLY. A RANGE OF 10-30 ug/mL MAY BE AN EFFECTIVE CONCENTRATION FOR MANY PATIENTS. HOWEVER, SOME ARE BEST TREATED AT CONCENTRATIONS OUTSIDE THIS RANGE. ACETAMINOPHEN CONCENTRATIONS >150 ug/mL AT 4 HOURS AFTER INGESTION AND >50 ug/mL AT 12 HOURS AFTER INGESTION ARE OFTEN ASSOCIATED WITH TOXIC REACTIONS.   Rapid urine drug screen (hospital performed)     Status: Abnormal   Collection Time: 07/28/17 11:40 AM  Result Value Ref Range   Opiates NONE DETECTED NONE DETECTED   Cocaine NONE DETECTED NONE DETECTED   Benzodiazepines NONE DETECTED NONE DETECTED   Amphetamines NONE DETECTED NONE DETECTED   Tetrahydrocannabinol POSITIVE (A) NONE DETECTED   Barbiturates NONE DETECTED NONE DETECTED    Comment:        DRUG SCREEN FOR MEDICAL PURPOSES ONLY.  IF CONFIRMATION IS NEEDED FOR ANY PURPOSE, NOTIFY LAB WITHIN 5 DAYS.        LOWEST DETECTABLE LIMITS FOR URINE DRUG SCREEN Drug Class        Cutoff (ng/mL) Amphetamine      1000 Barbiturate      200 Benzodiazepine   637 Tricyclics       858 Opiates          300 Cocaine          300 THC              50   Pregnancy, urine     Status: None   Collection Time: 07/28/17 11:40 AM  Result Value Ref Range   Preg Test, Ur NEGATIVE NEGATIVE    Comment:        THE SENSITIVITY OF THIS METHODOLOGY IS >20 mIU/mL.     Current Facility-Administered Medications  Medication Dose Route Frequency Provider Last Rate Last Dose  . amLODipine (NORVASC) tablet 10 mg  10 mg Oral Daily Francine Graven, DO   10 mg at 07/29/17 8502  . ARIPiprazole (ABILIFY) tablet 10 mg  10 mg Oral Daily [name], NP   10 mg at 07/29/17 7741  . hydrochlorothiazide (HYDRODIURIL) tablet 25 mg  25 mg Oral Daily Francine Graven, DO   25 mg at 07/29/17 2878  . hydrOXYzine (ATARAX/VISTARIL) tablet 25 mg  25 mg Oral Q6H PRN Francine Graven, DO      . lamoTRIgine (LAMICTAL) tablet 25 mg  25 mg Oral BID Francine Graven, DO   25 mg at 07/29/17 6767  . sertraline (ZOLOFT) tablet 100 mg  100 mg Oral QHS Francine Graven, DO   100 mg at 07/28/17 2128  . traZODone (DESYREL) tablet 150 mg  150 mg Oral QHS Francine Graven, DO   150 mg at 07/28/17 2128   Current Outpatient Prescriptions  Medication Sig Dispense Refill  . amLODipine (NORVASC) 10 MG tablet Take 1 tablet (10 mg total) by mouth daily.  For high blood pressure 30 tablet 0  . ARIPiprazole ER 400 MG PRSY Inject 400 mg into the muscle.    . hydrochlorothiazide (HYDRODIURIL) 25 MG tablet Take 25 mg by mouth daily.    . hydrOXYzine (ATARAX/VISTARIL) 25 MG tablet Take 1 tablet (25 mg total) by mouth every 6 (six) hours as needed for anxiety. 75 tablet 0  . lamoTRIgine (LAMICTAL) 25 MG tablet Take 25 mg by mouth 2 (two) times daily.    . sertraline (ZOLOFT) 100 MG tablet Take 1 tablet (100 mg total) by mouth at bedtime. For depression 30 tablet 0  . traZODone (DESYREL) 150 MG tablet Take 150 mg by mouth  at bedtime.    . ARIPiprazole (ABILIFY) 10 MG tablet Take 1 tablet (10 mg total) by mouth daily. For mood control (Patient not taking: Reported on 07/28/2017) 30 tablet 0  . nicotine (NICODERM CQ - DOSED IN MG/24 HOURS) 21 mg/24hr patch Place 1 patch (21 mg total) onto the skin daily. For nicotine withdrawal (Patient not taking: Reported on 07/28/2017) 28 patch 0  . traZODone (DESYREL) 50 MG tablet Take 1 tablet (50 mg total) by mouth at bedtime as needed for sleep. (Patient not taking: Reported on 07/28/2017) 30 tablet 0    Musculoskeletal: Strength & Muscle Tone: within normal limits Gait & Station: Patient was lying in bed in the supine position.  Patient leans: N/A  Psychiatric Specialty Exam: Physical Exam  Constitutional: She is oriented to person, place, and time. She appears well-developed and well-nourished.  HENT:  Head: Normocephalic and atraumatic.  Neck: Normal range of motion.  Respiratory: Effort normal.  Musculoskeletal: Normal range of motion.  Neurological: She is alert and oriented to person, place, and time.    Review of Systems  Psychiatric/Behavioral: Positive for depression, hallucinations (AH) and suicidal ideas (CAH). Negative for substance abuse. The patient is not nervous/anxious and does not have insomnia.     Blood pressure 136/86, pulse 66, temperature 98.7 F (37.1 C), temperature source Oral, resp. rate 16, SpO2 100 %.There is no height or weight on file to calculate BMI.  General Appearance: AA female with hospital scrubs and is maladorous with hirsutism.   Eye Contact:  Good  Speech:  Clear and Coherent  Volume:  Normal  Mood:  Depressed  Affect:  Constricted  Thought Process:  Goal Directed and Linear  Orientation:  Full (Time, Place, and Person)  Thought Content:  Hallucinations: Auditory Command:  to harm self  Suicidal Thoughts:  No  Homicidal Thoughts:  No  Memory:  Immediate;   Fair Recent;   Fair Remote;   Fair  Judgement:  Fair   Insight:  Fair  Psychomotor Activity:  Mostly lying still in bed but intermittent spontaneous movements.  Concentration:  Concentration: Good and Attention Span: Good  Recall:  Good  Fund of Knowledge:  Fair  Language:  Good  Akathisia:  No  Handed:  Right  AIMS (if indicated):  N/A  Assets:  Desire for Improvement Housing  ADL's:  Impaired  Cognition:  WNL  Sleep:  Good   Assessment: Patient presents with CAH to harm self in the setting of known schizoaffective disorder. She is high risk of harming self and warrants inpatient psychiatric hospitalization for stabilization and treatment.   Treatment Plan Summary: Schizoaffective disorder: Daily contact with patient to assess and evaluate symptoms and progress in treatment, Medication management and Plan Inaptient psychiatric hospitalization for stabilization and treatment.  -Continue home medications.   Disposition: Recommend psychiatric  Inpatient admission when medically cleared.  Faythe Dingwall, DO 07/29/2017 3:03 PM

## 2017-07-29 NOTE — Tx Team (Signed)
Initial Treatment Plan 07/29/2017 6:56 PM [name] OQH:[id number]    PATIENT STRESSORS: Medication change or noncompliance   PATIENT STRENGTHS: Curator fund of knowledge Supportive family/friends   PATIENT IDENTIFIED PROBLEMS: Patient endorses auditory hallucinations   Patient reports suicidal thoughts secondary to command hallucinations                    DISCHARGE CRITERIA:  Motivation to continue treatment in a less acute level of care Verbal commitment to aftercare and medication compliance  PRELIMINARY DISCHARGE PLAN: Return to previous living arrangement  PATIENT/FAMILY INVOLVEMENT: This treatment plan has been presented to and reviewed with the patient, Sarah [name], and/or family member,  The patient and family have been given the opportunity to ask questions and make suggestions.  [name], RN 07/29/2017, 6:56 PM

## 2017-07-29 NOTE — Progress Notes (Signed)
10.23.18 1446:  LRT went to pt room to offer activities, pt declined.  Victorino Sparrow, LRT/CTRS

## 2017-07-29 NOTE — Progress Notes (Signed)
Recently arrived to [facility] 

## 2017-07-29 NOTE — ED Notes (Signed)
Attempted to call nursing report to [facility] Adult unit. Informed nurse not available, informed will call back.

## 2017-07-29 NOTE — Progress Notes (Signed)
Patient ID: Sarah [name], female   DOB: 10/09/1975, 41 y.o.   MRN: [id number] Patient admitted to the unit due to increase auditory hallucinations and depressed mood.  Patient currently denies SI and has no actionable plan to commit suicide.  Patient reported being off her medications due to being unable to afford them.   Safety search complete and patient found to be free of any injury.  Patient oriented to the unit without incident.

## 2017-07-30 DIAGNOSIS — R450 Nervousness: Secondary | ICD-10-CM

## 2017-07-30 DIAGNOSIS — F1910 Other psychoactive substance abuse, uncomplicated: Secondary | ICD-10-CM

## 2017-07-30 DIAGNOSIS — G4700 Insomnia, unspecified: Secondary | ICD-10-CM

## 2017-07-30 DIAGNOSIS — F419 Anxiety disorder, unspecified: Secondary | ICD-10-CM

## 2017-07-30 DIAGNOSIS — F259 Schizoaffective disorder, unspecified: Secondary | ICD-10-CM

## 2017-07-30 DIAGNOSIS — Z818 Family history of other mental and behavioral disorders: Secondary | ICD-10-CM

## 2017-07-30 DIAGNOSIS — F17210 Nicotine dependence, cigarettes, uncomplicated: Secondary | ICD-10-CM

## 2017-07-30 LAB — TSH: TSH: 1.208 u[IU]/mL (ref 0.350–4.500)

## 2017-07-30 NOTE — Progress Notes (Signed)
D: Pt A & O X3. Denies SI, HI, AVH and pain at this time. Visible in milieu at intervals during shift. Rates her depression, hopelessness and anxiety all 0/10. Reports she slept well last night and her appetite has been good as well. Observed in groups, interacted well with peers and staff. Denies concerns at this time. A: All medications given as per MD's orders with verbal education and effects monitored. Writer informed pt of changes made to medication regimen. Encouraged pt to to comply with treatment regimen including groups. Routine safety checks maintained without self harm gestures.  R: Pt has been receptive to care. Compliant with medications when offered. Pt in agreement with medication changes. Attended groups. Off unit for recreational time with peers, returned without issues. POC remains effective for mood stability and safety.

## 2017-07-30 NOTE — Tx Team (Signed)
Interdisciplinary Treatment and Diagnostic Plan Update  07/30/2017 Time of Session: 10:18 AM  Sarah [name] MRN: [id number]  Principal Diagnosis: Schizoaffective disorder, bipolar type, MDD (major depressive disorder), recurrent episode, severe, Cannabis use Secondary Diagnoses: Active Problems:   Schizoaffective disorder (HCC)   Current Medications:  Current Facility-Administered Medications  Medication Dose Route Frequency Provider Last Rate Last Dose  . acetaminophen (TYLENOL) tablet 650 mg  650 mg Oral Q6H PRN Ethelene Hal, NP      . alum & mag hydroxide-simeth (MAALOX/MYLANTA) 200-200-20 MG/5ML suspension 30 mL  30 mL Oral Q4H PRN Ethelene Hal, NP      . amLODipine (NORVASC) tablet 10 mg  10 mg Oral Daily Ethelene Hal, NP   10 mg at 07/30/17 9326  . ARIPiprazole (ABILIFY) tablet 10 mg  10 mg Oral Daily Ethelene Hal, NP   10 mg at 07/30/17 0827  . hydrochlorothiazide (HYDRODIURIL) tablet 25 mg  25 mg Oral Daily Ethelene Hal, NP   25 mg at 07/30/17 0827  . hydrOXYzine (ATARAX/VISTARIL) tablet 25 mg  25 mg Oral Q6H PRN Ethelene Hal, NP      . lamoTRIgine (LAMICTAL) tablet 25 mg  25 mg Oral BID Ethelene Hal, NP   25 mg at 07/30/17 0827  . magnesium hydroxide (MILK OF MAGNESIA) suspension 30 mL  30 mL Oral Daily PRN Ethelene Hal, NP      . sertraline (ZOLOFT) tablet 100 mg  100 mg Oral QHS Ethelene Hal, NP   100 mg at 07/29/17 2136  . traZODone (DESYREL) tablet 150 mg  150 mg Oral QHS Ethelene Hal, NP   150 mg at 07/29/17 2136  . traZODone (DESYREL) tablet 50 mg  50 mg Oral QHS PRN Ethelene Hal, NP        PTA Medications: Prescriptions Prior to Admission  Medication Sig Dispense Refill Last Dose  . amLODipine (NORVASC) 10 MG tablet Take 1 tablet (10 mg total) by mouth daily. For high blood pressure 30 tablet 0 07/27/2017 at Unknown time  . ARIPiprazole (ABILIFY) 10 MG tablet Take 1  tablet (10 mg total) by mouth daily. For mood control (Patient not taking: Reported on 07/28/2017) 30 tablet 0 Not Taking at Unknown time  . ARIPiprazole ER 400 MG PRSY Inject 400 mg into the muscle.   06/21/2017  . hydrochlorothiazide (HYDRODIURIL) 25 MG tablet Take 25 mg by mouth daily.   07/27/2017 at Unknown time  . hydrOXYzine (ATARAX/VISTARIL) 25 MG tablet Take 1 tablet (25 mg total) by mouth every 6 (six) hours as needed for anxiety. 75 tablet 0 07/28/2017 at Unknown time  . lamoTRIgine (LAMICTAL) 25 MG tablet Take 25 mg by mouth 2 (two) times daily.   07/27/2017 at Unknown time  . nicotine (NICODERM CQ - DOSED IN MG/24 HOURS) 21 mg/24hr patch Place 1 patch (21 mg total) onto the skin daily. For nicotine withdrawal (Patient not taking: Reported on 07/28/2017) 28 patch 0 Completed Course at Unknown time  . sertraline (ZOLOFT) 100 MG tablet Take 1 tablet (100 mg total) by mouth at bedtime. For depression 30 tablet 0 07/27/2017 at Unknown time  . traZODone (DESYREL) 150 MG tablet Take 150 mg by mouth at bedtime.   07/27/2017 at Unknown time  . traZODone (DESYREL) 50 MG tablet Take 1 tablet (50 mg total) by mouth at bedtime as needed for sleep. (Patient not taking: Reported on 07/28/2017) 30 tablet 0 Not Taking at Unknown time  Treatment Modalities: Medication Management, Group therapy, Case management,  1 to 1 session with clinician, Psychoeducation, Recreational therapy.  Patient Stressors: Medication change or noncompliance  Patient Strengths: Curator fund of knowledge Supportive family/friends   Physician Treatment Plan for Primary Diagnosis: Schizoaffective disorder, bipolar type, MDD (major depressive disorder), recurrent episode, severe, Cannabis use Long Term Goal(s): Improvement in symptoms so as ready for discharge  Short Term Goals: Ability to identify changes in lifestyle to reduce recurrence of condition will improve Ability to verbalize feelings will  improve Ability to disclose and discuss suicidal ideas Ability to demonstrate self-control will improve Ability to identify and develop effective coping behaviors will improve Compliance with prescribed medications will improve Ability to identify triggers associated with substance abuse/mental health issues will improve  Medication Management: Evaluate patient's response, side effects, and tolerance of medication regimen.  Therapeutic Interventions: 1 to 1 sessions, Unit Group sessions and Medication administration.  Evaluation of Outcomes: Progressing  Physician Treatment Plan for Secondary Diagnosis: Active Problems:   Schizoaffective disorder ([place])  Long Term Goal(s): Improvement in symptoms so as ready for discharge  Short Term Goals: Ability to identify changes in lifestyle to reduce recurrence of condition will improve Ability to verbalize feelings will improve Ability to disclose and discuss suicidal ideas Ability to demonstrate self-control will improve Ability to identify and develop effective coping behaviors will improve Compliance with prescribed medications will improve Ability to identify triggers associated with substance abuse/mental health issues will improve  Medication Management: Evaluate patient's response, side effects, and tolerance of medication regimen.  Therapeutic Interventions: 1 to 1 sessions, Unit Group sessions and Medication administration.  Evaluation of Outcomes: Progressing   RN Treatment Plan for Primary Diagnosis: Schizoaffective disorder, bipolar type, MDD (major depressive disorder), recurrent episode, severe, Cannabis use Long Term Goal(s): Knowledge of disease and therapeutic regimen to maintain health will improve  Short Term Goals: Ability to remain free from injury will improve, Ability to disclose and discuss suicidal ideas, Ability to identify and develop effective coping behaviors will improve and Compliance with prescribed  medications will improve  Medication Management: RN will administer medications as ordered by provider, will assess and evaluate patient's response and provide education to patient for prescribed medication. RN will report any adverse and/or side effects to prescribing provider.  Therapeutic Interventions: 1 on 1 counseling sessions, Psychoeducation, Medication administration, Evaluate responses to treatment, Monitor vital signs and CBGs as ordered, Perform/monitor CIWA, COWS, AIMS and Fall Risk screenings as ordered, Perform wound care treatments as ordered.  Evaluation of Outcomes: Progressing   LCSW Treatment Plan for Primary Diagnosis: Schizoaffective disorder, bipolar type, MDD (major depressive disorder), recurrent episode, severe, Cannabis use Long Term Goal(s): Safe transition to appropriate next level of care at discharge, Engage patient in therapeutic group addressing interpersonal concerns.  Short Term Goals: Engage patient in aftercare planning with referrals and resources, Increase social support, Facilitate patient progression through stages of change regarding substance use diagnoses and concerns, Identify triggers associated with mental health/substance abuse issues and Increase skills for wellness and recovery  Therapeutic Interventions: Assess for all discharge needs, 1 to 1 time with Social worker, Explore available resources and support systems, Assess for adequacy in community support network, Educate family and significant other(s) on suicide prevention, Complete Psychosocial Assessment, Interpersonal group therapy.  Evaluation of Outcomes: Progressing   Progress in Treatment: Attending groups: No Participating in groups: No  Taking medication as prescribed: Yes Toleration of medication: Yes, no side effects reported at this time Family/Significant other  contact made: No  Patient understands diagnosis: Yes AEB asking for help with medications and coping skills   Discussing patient identified problems/goals with staff: Yes Medical problems stabilized or resolved: Yes Denies suicidal/homicidal ideation: Yes Issues/concerns per patient self-inventory: None Other: N/A  New problem(s) identified: None identified at this time.   New Short Term/Long Term Goal(s): Pt states they want help with "Getting back on my medications and working on some coping skills so I won't end up in the hospital anymore".   Discharge Plan or Barriers: Upon discharge pt will return home with her husband and mother-in-law in [facility]   Reason for Continuation of Hospitalization: Hallucinations Medication stabilization Suicidal ideation Withdrawal symptoms  Estimated Length of Stay: 08/04/2017  Attendees: Patient: Sarah [name]  07/30/2017  10:18 AM  Physician: Maris Berger, MD 07/30/2017  10:18 AM  Nursing: Jonette Mate, RN 07/30/2017  10:18 AM  RN Care Manager: Lars Pinks, RN 07/30/2017  10:18 AM  Social Worker: Ripley Fraise, LCSW; Verdis Frederickson, Social Work Intern 07/30/2017  10:18 AM  Recreational Therapist: Victorino Sparrow, LRT 07/30/2017  10:18 AM  Other: Norberto Sorenson, [place] 07/30/2017  10:18 AM  Other:  07/30/2017  10:18 AM  Other: 07/30/2017  10:18 AM    Scribe for Treatment Team: Darleen Crocker, Student-Social Work 07/30/2017 10:18 AM

## 2017-07-30 NOTE — [facility] Suicide Risk Assessment (Signed)
[name] Admission Suicide Risk Assessment   Nursing information obtained from:    Demographic factors:    Current Mental Status:    Loss Factors:    Historical Factors:    Risk Reduction Factors:     Total Time spent with patient: 30 minutes Principal Problem: Schizoaffective disorder Diagnosis:   Patient Active Problem List   Diagnosis Date Noted  . Schizoaffective disorder ([place]) [F25.9] 07/29/2017  . MDD (major depressive disorder), recurrent episode, severe ([place]) [F33.2] 02/19/2017  . Schizoaffective disorder, bipolar type ([place]) [F25.0] 01/06/2017  . 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 IIH (idiopathic intracranial hypertension) [G93.2] 01/24/2016  . Cannabis use disorder, moderate, dependence ([place]) [F12.20] 08/18/2015  . Schizoaffective disorder, depressive type ([place]) [F25.1] 01/15/2012    Class: Acute   Subjective Data:   -Sarah [name] is a 41 y/o F with history of schizoaffective disorder who was admitted voluntarily with worsening symptoms of psychosis and SI. Pt reports that she began to have a one day history of worsened CAH which told her to kill herself via an overdose. Pt denies having SI of her own, and she was concerned about her CAH, so she called emergency services. Pt also reports that she was experiencing VH of seeing "ghosts." She denies current SI/HI/AH/VH. She notes that she had been receiving her medications through [facility], but she has not followed up in several months. She was supposed to be receiving Abilify Maintena 400mg  q28 Days, and she last received it during and inpatient stay at [facility] about 1 month ago, but pt is unsure of the exact date. Pt states that there was not a specific stressor which contributed to her worsened CAH and depressed mood aside from being off of her medications. She notes that her sleep has been good and her appetite has been adequate. We discussed option of restarting her medications including oral abilify 10mg  qDay with  plan to verify last dose of injectable abilify and administer a dose if she is due. Pt was in agreement with this plan and she had no further questions, comments, or concerns.   Continued Clinical Symptoms:  Alcohol Use Disorder Identification Test Final Score (AUDIT): 0 The "Alcohol Use Disorders Identification Test", Guidelines for Use in Primary Care, Second Edition.  World Pharmacologist [facility]). Score between 0-7:  no or low risk or alcohol related problems. Score between 8-15:  moderate risk of alcohol related problems. Score between 16-19:  high risk of alcohol related problems. Score 20 or above:  warrants further diagnostic evaluation for alcohol dependence and treatment.   CLINICAL FACTORS:   Severe Anxiety and/or Agitation Schizophrenia:   Command hallucinatons   Musculoskeletal: Strength & Muscle Tone: within normal limits Gait & Station: normal Patient leans: N/A  Psychiatric Specialty Exam: Physical Exam  Nursing note and vitals reviewed.   Review of Systems  Constitutional: Negative for chills and fever.  Respiratory: Negative for cough and shortness of breath.   Cardiovascular: Negative for chest pain.  Gastrointestinal: Negative for abdominal pain, heartburn, nausea and vomiting.  Psychiatric/Behavioral: Positive for hallucinations.    Blood pressure 126/69, pulse 92, temperature 97.8 F (36.6 C), temperature source Oral, resp. rate 18, height 5\' 10"  (1.778 m), weight 108.9 kg (240 lb), last menstrual period 07/07/2017.Body mass index is 34.44 kg/m.  General Appearance: Casual  Eye Contact:  Good  Speech:  Clear and Coherent and Normal Rate  Volume:  Decreased  Mood:  Euthymic  Affect:  Appropriate, Congruent and Flat  Thought Process:  Coherent and Goal Directed  Orientation:  Full (Time, Place, and Person)  Thought Content:  Hallucinations: Command:  to kill self via overdose  Suicidal Thoughts:  No  Homicidal Thoughts:  No  Memory:  Immediate;    Good Recent;   Good Remote;   Good  Judgement:  Fair  Insight:  Fair  Psychomotor Activity:  Normal  Concentration:  Concentration: Fair  Recall:  Good  Fund of Knowledge:  Good  Language:  Fair  Akathisia:  No  Handed:    AIMS (if indicated):     Assets:  Communication Skills Desire for Improvement Financial Resources/Insurance Housing Intimacy Resilience Social Support  ADL's:  Intact  Cognition:  WNL  Sleep:  Number of Hours: 6.75      COGNITIVE FEATURES THAT CONTRIBUTE TO RISK:  None    SUICIDE RISK:   Moderate:  Frequent suicidal ideation with limited intensity, and duration, some specificity in terms of plans, no associated intent, good self-control, limited dysphoria/symptomatology, some risk factors present, and identifiable protective factors, including available and accessible social support.  PLAN OF CARE:  - Admit to the inpatient psychiatry unit - Restart previous medications of:  - Abilify 10mg  po qDay  - amlodipine 10mg  qDay  - HCTZ 25mg  qDay  - vistaril 25mg  q6h prn anxiety  - lamictal 25mg  BID  - trazodone 50mg  qhs prn insomnia  - zoloft 100mg  qDay - Labs: HgbA1c, prolactin, TSH - Attempt to verify dates of [facility] admission/last administration of long-acting abilify injection with plan to administer next injection if due - Encourage participation in groups and therapeutic milieu - Discharge planning will be ongoing  I certify that inpatient services furnished can reasonably be expected to improve the patient's condition.   Pennelope Bracken, MD 07/30/2017, 2:45 PM

## 2017-07-30 NOTE — H&P (Signed)
Psychiatric Admission Assessment Adult  Patient Identification: Sarah [name] MRN:  [id number]  Date of Evaluation:  07/30/2017  Chief Complaint: Suicidal ideations & auditory hallucinations.  Principal Diagnosis: Schizoaffective Disorder- depressive-type.  Diagnosis:   Patient Active Problem List   Diagnosis Date Noted  . Schizoaffective disorder, depressive type ([place]) [F25.1] 01/15/2012    Priority: High    Class: Acute  . Schizoaffective disorder ([place]) [F25.9] 07/29/2017  . MDD (major depressive disorder), recurrent episode, severe ([place]) [F33.2] 02/19/2017  . Schizoaffective disorder, bipolar type ([place]) [F25.0] 01/06/2017  . 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 IIH (idiopathic intracranial hypertension) [G93.2] 01/24/2016  . Cannabis use disorder, moderate, dependence ([place]) [F12.20] 08/18/2015   History of Present Illness: This is one of several admission assessments from this hospital alone for this 41 year old Caucasian female with hx of chronic mental illness. Patient is known in this hospital from previous psychiatric hospitalizations for mood stabilization treatments. She returns to the hospital with complaints of suicidal ideations Sarah [name] is a 41 y/o F with history of schizoaffective disorder who was admitted voluntarily with worsening symptoms of psychosis and SI. Pt reports that she began to have a one day history of worsened CAH which told her to kill herself via an overdose. Pt denies having SI of her own, and she was concerned about her CAH, so she called emergency services. Pt also reports that she was experiencing VH of seeing "ghosts." She denies current SI/HI/AH/VH. She notes that she had been receiving her medications through [facility], but she has not followed up in several months. She was supposed to be receiving Abilify Maintena 426m q28 Days, and [facility] but pt is unsure of  the exact date. Pt states that there was not a specific stressor which contributed to her worsened CAH and depressed mood aside from being off of her medications. She notes that her sleep has been good and her appetite has been adequate. We discussed option of restarting her medications including oral abilify 145mqDay with plan to verify last dose of injectable abilify and administer a dose if she is due. Pt was in agreement with this plan and she had no further questions, comments, or concerns.   Associated Signs/Symptoms: Depression Symptoms:  depressed mood, insomnia, suicidal thoughts with specific plan,  (Hypo) Manic Symptoms:  Denies any hypomanic symptoms & does not currently present with manic symptoms.  Anxiety Symptoms: Denies any symptoms  Psychotic Symptoms: States she had been experiencing auditory hallucinations, telling her to kill self on [place] they are now resolved .  PTSD Symptoms: Denies   Total Time spent with patient: 1 hour  Past Psychiatric History: Several prior psychiatric admissions, last time, March of 2018. She has been diagnosed with Bipolar Disorder and Schizoaffective Disorder in the past.  At the time was admitted for disorganized behaviors, was diagnosed with Schizoaffective Disorder, and was discharged on  Long actin IM Abilify, Lamictal, Trazodone.  She has history of suicidal attempt by overdosing in 2016. Denies history of self cutting, denies history of violence. States she has history of hallucinations, which occur only when feeling depressed .  Is the patient at risk to self? No.  Has the patient been a risk to self in the past 6 months? Yes.    Has the patient been a risk to self within the distant past? Yes.    Is the patient a risk to others?  No.  Has the patient been a risk to others in the past 6 months? No.  Has the patient been a risk to others within the distant past? No.   Prior Inpatient Therapy: Yes, ([place] x numerous  times).  Prior Outpatient Therapy: Follows up at [facility]   Alcohol Screening: Patient refused Alcohol Screening Tool: Yes 1. How often do you have a drink containing alcohol?: Never 9. Have you or someone else been injured as a result of your drinking?: No 10. Has a relative or friend or a doctor or another health worker been concerned about your drinking or suggested you cut down?: No Alcohol Use Disorder Identification Test Final Score (AUDIT): 0 Brief Intervention: AUDIT score less than 7 or less-screening does not suggest unhealthy drinking-brief intervention not indicated  Substance Abuse History in the last 12 months: Yes ([facility]C)  Consequences of Substance Abuse: Denies.   Previous Psychotropic Medications: Yes (Abilify injectable).  Psychological Evaluations: No   Past Medical History:  Past Medical History:  Diagnosis Date  . Anemia   . Anxiety   . Depression   . Elevated bilirubin    slight elevation at 1.3  . Hypertension   . Hypokalemia   . Intracranial hypertension   . Migraines   . Mild intellectual disability   . Schizoaffective disorder [facility])     Past Surgical History:  Procedure Laterality Date  . NO PAST SURGERIES     Family History: Father died from MI, mother is alive, has three brothers, one sister died from complications of sickle cell disease. Family History  Problem Relation Age of Onset  . Schizophrenia Mother   . Hypertension Mother   . Anesthesia problems Neg Hx   . Malignant hyperthermia Neg Hx   . Pseudochol deficiency Neg Hx   . Hypotension Neg Hx   . Migraines Neg Hx    Family Psychiatric  History: States mother may have Bipolar Disorder, no suicides in family, mother has history of substance abuse, now in remission  Tobacco Screening: Have you used any form of tobacco in the last 30 days? (Cigarettes, Smokeless Tobacco, Cigars, and/or Pipes): Yes Tobacco use, Select all that apply: 5 or more cigarettes per day Are you interested in  Tobacco Cessation Medications?: Yes, will notify MD for an order Counseled patient on smoking cessation including recognizing danger situations, developing coping skills and basic information about quitting provided: Refused/Declined practical counseling  Social History: Married, no children, lives with husband, on disability, no legal issues.  History  Alcohol Use No    Comment: denied all drug/alcohol use     History  Drug Use  . Types: Marijuana    Comment: denied all drug/alcohol use    Additional Social History:  Allergies: PCN  Lab Results:  Results for orders placed or performed during the hospital encounter of 07/28/17 (from the past 48 hour(s))  Acetaminophen level     Status: Abnormal   Collection Time: 07/28/17 11:09 AM  Result Value Ref Range   Acetaminophen (Tylenol), Serum <10 (L) 10 - 30 ug/mL    Comment:        THERAPEUTIC CONCENTRATIONS VARY SIGNIFICANTLY. A RANGE OF 10-30 ug/mL MAY BE AN EFFECTIVE CONCENTRATION FOR MANY PATIENTS. HOWEVER, SOME ARE BEST TREATED AT CONCENTRATIONS OUTSIDE THIS RANGE. ACETAMINOPHEN CONCENTRATIONS >150 ug/mL AT 4 HOURS AFTER INGESTION AND >50 ug/mL AT 12 HOURS AFTER INGESTION ARE OFTEN ASSOCIATED WITH TOXIC REACTIONS.   Rapid urine drug screen (hospital performed)     Status: Abnormal  Collection Time: 07/28/17 11:40 AM  Result Value Ref Range   Opiates NONE DETECTED NONE DETECTED   Cocaine NONE DETECTED NONE DETECTED   Benzodiazepines NONE DETECTED NONE DETECTED   Amphetamines NONE DETECTED NONE DETECTED   Tetrahydrocannabinol POSITIVE (A) NONE DETECTED   Barbiturates NONE DETECTED NONE DETECTED    Comment:        DRUG SCREEN FOR MEDICAL PURPOSES ONLY.  IF CONFIRMATION IS NEEDED FOR ANY PURPOSE, NOTIFY LAB WITHIN 5 DAYS.        LOWEST DETECTABLE LIMITS FOR URINE DRUG SCREEN Drug Class       Cutoff (ng/mL) Amphetamine      1000 Barbiturate      200 Benzodiazepine   370 Tricyclics       488 Opiates           300 Cocaine          300 THC              50   Pregnancy, urine     Status: None   Collection Time: 07/28/17 11:40 AM  Result Value Ref Range   Preg Test, Ur NEGATIVE NEGATIVE    Comment:        THE SENSITIVITY OF THIS METHODOLOGY IS >20 mIU/mL.    Blood Alcohol level:  Lab Results  Component Value Date   ETH <10 07/28/2017   ETH <5 89/16/9450   Metabolic Disorder Labs:  Lab Results  Component Value Date   HGBA1C 5.4 01/04/2017   MPG 108 01/04/2017   MPG 120 08/19/2015   Lab Results  Component Value Date   PROLACTIN 18.7 01/04/2017   PROLACTIN 15.6 08/19/2015   Lab Results  Component Value Date   CHOL 179 01/04/2017   TRIG 158 (H) 01/04/2017   HDL 58 01/04/2017   CHOLHDL 3.1 01/04/2017   VLDL 32 01/04/2017   LDLCALC 89 01/04/2017   LDLCALC 72 08/19/2015   Current Medications: Current Facility-Administered Medications  Medication Dose Route Frequency Provider Last Rate Last Dose  . acetaminophen (TYLENOL) tablet 650 mg  650 mg Oral Q6H PRN Ethelene Hal, NP      . alum & mag hydroxide-simeth (MAALOX/MYLANTA) 200-200-20 MG/5ML suspension 30 mL  30 mL Oral Q4H PRN Ethelene Hal, NP      . amLODipine (NORVASC) tablet 10 mg  10 mg Oral Daily Ethelene Hal, NP   10 mg at 07/30/17 3888  . ARIPiprazole (ABILIFY) tablet 10 mg  10 mg Oral Daily Ethelene Hal, NP   10 mg at 07/30/17 0827  . hydrochlorothiazide (HYDRODIURIL) tablet 25 mg  25 mg Oral Daily Ethelene Hal, NP   25 mg at 07/30/17 0827  . hydrOXYzine (ATARAX/VISTARIL) tablet 25 mg  25 mg Oral Q6H PRN Ethelene Hal, NP      . lamoTRIgine (LAMICTAL) tablet 25 mg  25 mg Oral BID Ethelene Hal, NP   25 mg at 07/30/17 0827  . magnesium hydroxide (MILK OF MAGNESIA) suspension 30 mL  30 mL Oral Daily PRN Ethelene Hal, NP      . sertraline (ZOLOFT) tablet 100 mg  100 mg Oral QHS Ethelene Hal, NP   100 mg at 07/29/17 2136  . traZODone (DESYREL)  tablet 150 mg  150 mg Oral QHS Ethelene Hal, NP   150 mg at 07/29/17 2136  . traZODone (DESYREL) tablet 50 mg  50 mg Oral QHS PRN Ethelene Hal, NP  PTA Medications: Prescriptions Prior to Admission  Medication Sig Dispense Refill Last Dose  . amLODipine (NORVASC) 10 MG tablet Take 1 tablet (10 mg total) by mouth daily. For high blood pressure 30 tablet 0 07/27/2017 at Unknown time  . ARIPiprazole (ABILIFY) 10 MG tablet Take 1 tablet (10 mg total) by mouth daily. For mood control (Patient not taking: Reported on 07/28/2017) 30 tablet 0 Not Taking at Unknown time  . ARIPiprazole ER 400 MG PRSY Inject 400 mg into the muscle.   06/21/2017  . hydrochlorothiazide (HYDRODIURIL) 25 MG tablet Take 25 mg by mouth daily.   07/27/2017 at Unknown time  . hydrOXYzine (ATARAX/VISTARIL) 25 MG tablet Take 1 tablet (25 mg total) by mouth every 6 (six) hours as needed for anxiety. 75 tablet 0 07/28/2017 at Unknown time  . lamoTRIgine (LAMICTAL) 25 MG tablet Take 25 mg by mouth 2 (two) times daily.   07/27/2017 at Unknown time  . nicotine (NICODERM CQ - DOSED IN MG/24 HOURS) 21 mg/24hr patch Place 1 patch (21 mg total) onto the skin daily. For nicotine withdrawal (Patient not taking: Reported on 07/28/2017) 28 patch 0 Completed Course at Unknown time  . sertraline (ZOLOFT) 100 MG tablet Take 1 tablet (100 mg total) by mouth at bedtime. For depression 30 tablet 0 07/27/2017 at Unknown time  . traZODone (DESYREL) 150 MG tablet Take 150 mg by mouth at bedtime.   07/27/2017 at Unknown time  . traZODone (DESYREL) 50 MG tablet Take 1 tablet (50 mg total) by mouth at bedtime as needed for sleep. (Patient not taking: Reported on 07/28/2017) 30 tablet 0 Not Taking at Unknown time   Musculoskeletal: Strength & Muscle Tone: within normal limits Gait & Station: normal Patient leans: N/A  Psychiatric Specialty Exam: Physical Exam  Constitutional: She is oriented to person, place, and time. She appears  well-developed.  Obese.  HENT:  Head: Normocephalic.  Eyes: Pupils are equal, round, and reactive to light.  Neck: Normal range of motion.  Cardiovascular: Normal rate.   Respiratory: Effort normal.  GI: Soft.  Genitourinary:  Genitourinary Comments: Deferred  Musculoskeletal: Normal range of motion.  Neurological: She is alert and oriented to person, place, and time.  Skin: Skin is warm.    Review of Systems  Constitutional: Negative.   HENT: Negative.   Eyes: Negative.   Respiratory: Negative.   Cardiovascular: Negative.   Gastrointestinal: Negative.   Genitourinary: Negative.   Musculoskeletal: Negative.   Skin: Negative.   Neurological: Negative for seizures.  Endo/Heme/Allergies: Negative.   Psychiatric/Behavioral: Positive for depression, substance abuse (UDS (+) for THC.) and suicidal ideas. Negative for hallucinations and memory loss. The patient is nervous/anxious and has insomnia.   All other systems reviewed and are negative.   Blood pressure 126/69, pulse 92, temperature 97.8 F (36.6 C), temperature source Oral, resp. rate 18, height '5\' 10"'  (1.778 m), weight 108.9 kg (240 lb), last menstrual period 07/07/2017.Body mass index is 34.44 kg/m.  General Appearance: Disheveled  Eye Contact:  Fair  Speech:  Clear and Coherent and Slow, not spontaneous.  Volume:  Decreased  Mood:  Depressed  Affect:  Non-Congruent, affect is reactive.  Thought Process:  Coherent and Descriptions of Associations: Intact  Orientation:  Full (Time, Place, and Person)  Thought Content:  currently, denies hallucinations at this time and does not appear internally preoccupied, no delusions expressed   Suicidal Thoughts:  Denies  any suicidal or self injurious ideations at this time and contracts for safety on unit  Homicidal Thoughts:  No, denies any homicidal ideations and also specifically denies any HI or violent ideations towards husband   Memory:  Recent & remote grossly intact    Judgement:  Fair  Insight:  Fair  Psychomotor Activity:  Decreased  Concentration:  Concentration: Good and Attention Span: Good  Recall:  Good  Fund of Knowledge:  Good  Language:  Fair  Akathisia:  Negative  Handed:  Right  AIMS (if indicated):     Assets:  Desire for Improvement Resilience  ADL's:  Intact  Cognition:  WNL  Sleep:     Treatment Plan/Recommendations: 1. Admit for crisis management and stabilization, estimated length of stay 3-5 days.  2. Medication management to reduce current symptoms to base line and improve the patient's overall level of functioning: See [facility] for the plan of care.  3. Treat health problems as indicated: .  4. Develop treatment plan to decrease risk of relapse upon discharge and the need for readmission.  5. Psycho-social education regarding relapse prevention and self care.  6. Health care follow up as needed for medical problems.  7. Review, reconcile, and reinstate any pertinent home medications for other health issues where appropriate. 8. Call for consults with hospitalist for any additional specialty patient care services as needed.  Observation Level/Precautions:  15 minute checks  Laboratory:  Per ED, UDS positive for [facility]  Psychotherapy: Milieu, group therapy   Medications: See MAR.    Consultations:  As needed   Discharge Concerns: Mood stability.  Estimated LOS: 5-7 days   Other: Admit to the 500 hall.   Physician Treatment Plan for Primary Diagnosis:  Schizoaffective Disorder, Depressed   Long Term Goal(s): Improvement in symptoms so as ready for discharge  Short Term Goals: Ability to identify changes in lifestyle to reduce recurrence of condition will improve, Ability to verbalize feelings will improve, Ability to disclose and discuss suicidal ideas and Ability to demonstrate self-control will improve  Physician Treatment Plan for Secondary Diagnosis: Active Problems:   Schizoaffective disorder ([place])  Long Term Goal(s):  Improvement in symptoms so as ready for discharge  Short Term Goals: Ability to identify and develop effective coping behaviors will improve, Compliance with prescribed medications will improve and Ability to identify triggers associated with substance abuse/mental health issues will improve  I certify that inpatient services furnished can reasonably be expected to improve the patient's condition.    Encarnacion Slates, NP, PMHNP, FNP-BC. 10/24/201811:08 AM   I have reviewed NP's Note, assessement, diagnosis and plan, and agree. I have also met with patient and completed suicide risk assessment.  Sarah [name] is a 41 y/o F with history of schizoaffective disorder who was admitted voluntarily with worsening symptoms of psychosis and SI. Pt reports that she began to have a one day history of worsened CAH which told her to kill herself via an overdose. Pt denies having SI of her own, and she was concerned about her CAH, so she called emergency services. Pt also reports that she was experiencing VH of seeing "ghosts." She denies current SI/HI/AH/VH. She notes that she had been receiving her medications [facility], but she has not followed up in several months. She was supposed to be receiving Abilify Maintena 434m q28 Days, and [facility] but pt is unsure of the exact date. Pt states that there was not a specific stressor which contributed to her worsened CAH and depressed mood aside  from being off of her medications. She notes that her sleep has been good and her appetite has been adequate. We discussed option of restarting her medications including oral abilify 58m qDay with plan to verify last dose of injectable abilify and administer a dose if she is due. Pt was in agreement with this plan and she had no further questions, comments, or concerns.  PLAN OF CARE:  - Admit to the inpatient psychiatry unit - Restart previous  medications of:             - Abilify 188mpo qDay             - amlodipine 1060mDay             - HCTZ 71m58may             - vistaril 71mg71m prn anxiety             - lamictal 71mg 20m            - trazodone 50mg q47mrn insomnia             - zoloft 100mg qD87m Labs: HgbA1c, prolactin, TSH - Attempt to verify dates of [facility] admission/last administration of long-acting abilify injection with plan to administer next injection if due - Encourage participation in groups and therapeutic milieu - Discharge planning will be ongoing  ChristopMaris Berger

## 2017-07-30 NOTE — [facility] Group Notes (Signed)
LCSW Group Therapy Note  07/30/2017 1:15pm  Type of Therapy/Topic:  Group Therapy:  Balance in Life  Participation Level:  Active  Description of Group:    This group will address the concept of balance and how it feels and looks when one is unbalanced. Patients will be encouraged to process areas in their lives that are out of balance and identify reasons for remaining unbalanced. Facilitators will guide patients in utilizing problem-solving interventions to address and correct the stressor making their life unbalanced. Understanding and applying boundaries will be explored and addressed for obtaining and maintaining a balanced life. Patients will be encouraged to explore ways to assertively make their unbalanced needs known to significant others in their lives, using other group members and facilitator for support and feedback.  Therapeutic Goals: 1. Patient will identify two or more emotions or situations they have that consume much of in their lives. 2. Patient will identify signs/triggers that life has become out of balance:  3. Patient will identify two ways to set boundaries in order to achieve balance in their lives:  4. Patient will demonstrate ability to communicate their needs through discussion and/or role plays  Summary of Patient Progress:   Deashia attended group but was pulled out of the room by the doctor half way through the session.  She returned at the end of the group after speaking with the doctor.  She shared that when she is sad she cries and makes sobing noises.  When she is sad she isolates herself.  To bring herself out of a sad mood she enjoys reading a book.  She also likes to write poems to help calm herself when she is sad. No apparent psychosis and her mood was neutral.     Therapeutic Modalities:   Cognitive Behavioral Therapy Solution-Focused Therapy Assertiveness Training  Darleen Crocker, [place] Work 07/30/2017 11:45 AM

## 2017-07-30 NOTE — Progress Notes (Signed)
Patient ID: Sarah [name], female   DOB: [birth year], 41 y.o.   MRN: [id number]  Pt currently presents with a blunted affect and pleasant but guarded behavior. Pt reports to writer that their goal is to with a big smile "my day was a ten today!" Pt reports good sleep with current medication regimen.   Pt provided with medications per providers orders. Pt's labs and vitals were monitored throughout the night. Pt given a 1:1 about emotional and mental status. Pt supported and encouraged to express concerns and questions. Pt educated on medications.   Pt's safety ensured with 15 minute and environmental checks. Pt currently denies SI/HI and A/V hallucinations. Pt verbally agrees to seek staff if SI/HI or A/VH occurs and to consult with staff before acting on any harmful thoughts. Will continue POC.

## 2017-07-30 NOTE — Progress Notes (Signed)
Recreation Therapy Notes  INPATIENT RECREATION THERAPY ASSESSMENT  Patient Details Name: Sarah [name] MRN: [id number] DOB: [birth year] Today's Date: 07/30/2017  Patient Stressors:  (No stressors identified)  Pt stated she was here for suicidal thoughts and hearing voices.  Coping Skills:   Isolate, Avoidance, Self-Injury, Art/Dance, Talking, Music  Personal Challenges: Self-Esteem/Confidence, Stress Management, Trusting Others  Leisure Interests (2+):  Individual - Reading, Individual - Writing, Music - Listen  Awareness of Community Resources:  Yes  Community Resources:  Restaurants, [employer], [employer]  Current Use: Yes  Patient Strengths:  Animal nutritionist; writing poems  Patient Identified Areas of Improvement:  Depression; get back on medications  Current Recreation Participation:  Every other week; listen to music daily  Patient Goal for Hospitalization:  "Get back on medications, work on coping skills"  [place] of Residence:  [place] of Residence:  [place]  Current [place] (including self-harm):  No  Current HI:  No  Consent to Intern Participation: N/A   Victorino Sparrow, LRT/CTRS  Victorino Sparrow A 07/30/2017, 2:43 PM

## 2017-07-30 NOTE — Progress Notes (Signed)
Recreation Therapy Notes  Date: 07/30/17 Time: 1000 Location: 500 Hall Dayroom  Group Topic: Stress Management  Goal Area(s) Addresses:  Patient will verbalize importance of using healthy stress management.  Patient will identify positive emotions associated with healthy stress management.   Behavioral Response: Engaged  Intervention: Visual merchandiser, markers  Activity :  Personalized Plates.  Patients were to create a personalized plate where they were to highlight the positive things about themselves.  Patients could highlight things such as biggest accomplishment, important dates, favorite foods, etc.  Education:  Stress Management, Discharge Planning.   Education Outcome: Acknowledges edcuation/In group clarification offered/Needs additional education  Clinical Observations/Feedback:  Pt stated "your personality" can affect your self esteem.  Pt highlighted her traits as being lovable and sweet, a flower for when she got married, graduating high school, a rainbow because she likes colors, likes the [employer], fried chicken and her favorite movie is Arts administrator.  Pt stated people tend to focus more on the negative.  Pt expressed if she focused more on the positive "it would keep me from being depressed".   Victorino Sparrow, LRT/CTRS         Victorino Sparrow A 07/30/2017 12:27 PM

## 2017-07-30 NOTE — Progress Notes (Signed)
Patient ID: Sarah [name], female   DOB: [birth year], 41 y.o.   MRN: [id number]  D: Patient has been in and out of the dayroom. Pt reports she was unable to afford medication when she run out. Pt reports she is glad to be back on her medications. Pt denies SI/HI/AVH and pain.No behavioral issues noted.  A: Support and encouragement offered as needed. Medications administered as prescribed.  R: Patient is safe and cooperative on unit. Will continue to monitor  for safety and stability.

## 2017-07-30 NOTE — Progress Notes (Signed)
Patient attended group and said that her day was a 10.  Her coping skills were sleeping and watching tv.

## 2017-07-30 NOTE — B[facility] Suicide Risk Assessment (Signed)
[facility] INPATIENT:  Family/Significant Other Suicide Prevention Education  Suicide Prevention Education:  Patient Refusal for Family/Significant Other Suicide Prevention Education: The patient Sarah [name] has refused to provide written consent for family/significant other to be provided Family/Significant Other Suicide Prevention Education during admission and/or prior to discharge.  Physician notified.  Frutoso Chase Rashida Ladouceur 07/30/2017, 10:24 AM

## 2017-07-30 NOTE — Plan of Care (Signed)
Problem: Safety: Goal: Ability to remain free from injury will improve Outcome: Progressing Routine safety checks maintained on and off unit without self harm gestures or outburst to note at present.
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
## 2017-07-31 LAB — HEMOGLOBIN A1C
Hgb A1c MFr Bld: 5.1 % (ref 4.8–5.6)
MEAN PLASMA GLUCOSE: 99.67 mg/dL

## 2017-07-31 MED ORDER — ARIPIPRAZOLE ER 400 MG IM SRER
400.0000 mg | INTRAMUSCULAR | Status: DC
  Administered 2017-08-01: 400 mg via INTRAMUSCULAR
  Filled 2017-07-31: qty 400

## 2017-07-31 NOTE — Social Work (Signed)
Referred to [facility]itional [facility] Team, is [place] resident.  Belmont Valli, LCSW Lead Clinical Social Worker Phone:  [phone]  

## 2017-07-31 NOTE — Progress Notes (Signed)
Recreation Therapy Notes  Date: 07/31/17 Time: 1000 Location: 500 Hall Dayroom  Group Topic: Life Goals, Goal Setting  Goal Area(s) Addresses:  Patient will be able to identify at least 3 life goals.  Patient will be able to identify benefit of investing in life goals.  Patient will be able to identify benefit of setting life goals.   Intervention: Goal Planning Worksheet  Activity: Goal Planning.  Patients were given a worksheet in which they had to come up with a goal for next week, next month, next year and in five years.  Patients were to then identify the obstacles to reaching those goals, what they need to achieve their goals and what they can begin doing to work towards their goals.  Education: Discharge Planning, Coping Skills, Life Goals  Education Outcome: Acknowledges Education/In Group Clarification Provided/Needs Additional Education  Clinical Observations:  Pt did not attend group.   Nevan Creighton, LRT/CTRS         Beverley Sherrard A 07/31/2017 11:49 AM 

## 2017-07-31 NOTE — Progress Notes (Signed)
[facility] MD Progress Note  07/31/2017 3:55 PM Sarah [name]  MRN:  [id number] Subjective:   "Yesterday was good because I went to group and it was helpful." -Sarah [name] is a 41 y/o F with hx of schizoaffective disorder who presented with worsening SI and CAH in the context of being off of her her medications. She was restarted on previous home medications of abilify (oral preparation with plan to transition to long-acting injectable form). She reports that her mood is good today. She denies SI/HI/AH/VH. She has been sleeping well and her appetite is good. She is tolerating restarting her medications without difficulty. She lives with her husband and her husband's mother, and she states that it is a good arrangement; she feels that she would be safe to discharge to home tomorrow. She had no other questions, comments, or concerns at time of evaluation.  Principal Problem: schizoaffective disorder Diagnosis:   Patient Active Problem List   Diagnosis Date Noted  . Schizoaffective disorder ([place]) [F25.9] 07/29/2017  . MDD (major depressive disorder), recurrent episode, severe ([place]) [F33.2] 02/19/2017  . Schizoaffective disorder, bipolar type ([place]) [F25.0] 01/06/2017  . 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 IIH (idiopathic intracranial hypertension) [G93.2] 01/24/2016  . Cannabis use disorder, moderate, dependence ([place]) [F12.20] 08/18/2015  . Schizoaffective disorder, depressive type ([place]) [F25.1] 01/15/2012    Class: Acute   Total Time spent with patient: 30 minutes  Past Psychiatric History: see H&P  Past Medical History:  Past Medical History:  Diagnosis Date  . Anemia   . Anxiety   . Depression   . Elevated bilirubin    slight elevation at 1.3  . Hypertension   . Hypokalemia   . Intracranial hypertension   . Migraines   . Mild intellectual disability   . Schizoaffective disorder [facility])     Past Surgical History:  Procedure Laterality Date  . NO PAST SURGERIES      Family History:  Family History  Problem Relation Age of Onset  . Schizophrenia Mother   . Hypertension Mother   . Anesthesia problems Neg Hx   . Malignant hyperthermia Neg Hx   . Pseudochol deficiency Neg Hx   . Hypotension Neg Hx   . Migraines Neg Hx    Family Psychiatric  History: see H&P Social History:  History  Alcohol Use No    Comment: denied all drug/alcohol use     History  Drug Use  . Types: Marijuana    Comment: denied all drug/alcohol use    Social History   Social History  . Marital status: Single    Spouse name: N/A  . Number of children: 0  . Years of education: 12   Occupational History  . Unemployed    Social History Main Topics  . Smoking status: Current Every Day Smoker    Packs/day: 1.00    Years: 15.00    Types: Cigarettes    Last attempt to quit: 01/02/2017  . Smokeless tobacco: Never Used     Comment: smoked since 41 yrs old  . Alcohol use No     Comment: denied all drug/alcohol use  . Drug use: Yes    Types: Marijuana     Comment: denied all drug/alcohol use  . Sexual activity: Yes    Birth control/ protection: None   Other Topics Concern  . None   Social History Narrative   Lives with fiance, Erlene Quan   Caffeine use: Soda: 1 soda daily   No coffee  No tea   Additional Social History:                         Sleep: Good  Appetite:  Good  Current Medications: Current Facility-Administered Medications  Medication Dose Route Frequency Provider Last Rate Last Dose  . acetaminophen (TYLENOL) tablet 650 mg  650 mg Oral Q6H PRN Ethelene Hal, NP      . alum & mag hydroxide-simeth (MAALOX/MYLANTA) 200-200-20 MG/5ML suspension 30 mL  30 mL Oral Q4H PRN Ethelene Hal, NP      . amLODipine (NORVASC) tablet 10 mg  10 mg Oral Daily Ethelene Hal, NP   10 mg at 07/31/17 0818  . ARIPiprazole (ABILIFY) tablet 10 mg  10 mg Oral Daily Ethelene Hal, NP   10 mg at 07/31/17 0818  . [START ON  08/01/2017] ARIPiprazole ER SRER 400 mg  400 mg Intramuscular Q28 days Maris Berger T, MD      . hydrochlorothiazide (HYDRODIURIL) tablet 25 mg  25 mg Oral Daily Ethelene Hal, NP   25 mg at 07/31/17 0818  . hydrOXYzine (ATARAX/VISTARIL) tablet 25 mg  25 mg Oral Q6H PRN Ethelene Hal, NP      . lamoTRIgine (LAMICTAL) tablet 25 mg  25 mg Oral BID Ethelene Hal, NP   25 mg at 07/31/17 0818  . magnesium hydroxide (MILK OF MAGNESIA) suspension 30 mL  30 mL Oral Daily PRN Ethelene Hal, NP      . sertraline (ZOLOFT) tablet 100 mg  100 mg Oral QHS Ethelene Hal, NP   100 mg at 07/30/17 2127  . traZODone (DESYREL) tablet 150 mg  150 mg Oral QHS Ethelene Hal, NP   150 mg at 07/30/17 2128    Lab Results:  Results for orders placed or performed during the hospital encounter of 07/29/17 (from the past 48 hour(s))  TSH     Status: None   Collection Time: 07/30/17  6:21 PM  Result Value Ref Range   TSH 1.208 0.350 - 4.500 uIU/mL    Comment: Performed by a 3rd Generation assay with a functional sensitivity of <=0.01 uIU/mL. Performed at [facility], [place] [address], [place], [place] [address]   Hemoglobin A1c     Status: None   Collection Time: 07/31/17  6:10 AM  Result Value Ref Range   Hgb A1c MFr Bld 5.1 4.8 - 5.6 %    Comment: (NOTE) Pre diabetes:          5.7%-6.4% Diabetes:              >6.4% Glycemic control for   <7.0% adults with diabetes    Mean Plasma Glucose 99.67 mg/dL    Comment: Performed at [facility] [address], [place], [place] [address]    Blood Alcohol level:  Lab Results  Component Value Date   [facility] <10 07/28/2017   ETH <5 25/36/6440    Metabolic Disorder Labs: Lab Results  Component Value Date   HGBA1C 5.1 07/31/2017   MPG 99.67 07/31/2017   MPG 108 01/04/2017   Lab Results  Component Value Date   PROLACTIN 18.7 01/04/2017   PROLACTIN 15.6 08/19/2015   Lab Results   Component Value Date   CHOL 179 01/04/2017   TRIG 158 (H) 01/04/2017   HDL 58 01/04/2017   CHOLHDL 3.1 01/04/2017   VLDL 32 01/04/2017   LDLCALC 89 01/04/2017  [place] 72 08/19/2015    Physical Findings: AIMS: Facial and Oral Movements Muscles of Facial Expression: None, normal Lips and Perioral Area: None, normal Jaw: None, normal Tongue: None, normal,Extremity Movements Upper (arms, wrists, hands, fingers): None, normal Lower (legs, knees, ankles, toes): None, normal, Trunk Movements Neck, shoulders, hips: None, normal, Overall Severity Severity of abnormal movements (highest score from questions above): None, normal Incapacitation due to abnormal movements: None, normal Patient's awareness of abnormal movements (rate only patient's report): No Awareness, Dental Status Current problems with teeth and/or dentures?: No Does patient usually wear dentures?: No  CIWA:  CIWA-Ar Total: 0 COWS:  COWS Total Score: 0  Musculoskeletal: Strength & Muscle Tone: within normal limits Gait & Station: normal Patient leans: N/A  Psychiatric Specialty Exam: Physical Exam  Nursing note and vitals reviewed.   Review of Systems  Constitutional: Negative for chills and fever.  Respiratory: Negative for cough.   Cardiovascular: Negative for chest pain.  Gastrointestinal: Negative for heartburn, nausea and vomiting.  Skin: Negative for rash.  Psychiatric/Behavioral: Negative for depression, hallucinations and suicidal ideas.    Blood pressure 126/69, pulse 92, temperature 97.8 F (36.6 C), temperature source Oral, resp. rate 18, height 5\' 10"  (1.778 m), weight 108.9 kg (240 lb), last menstrual period 07/07/2017.Body mass index is 34.44 kg/m.  General Appearance: Casual  Eye Contact:  Good  Speech:  Clear and Coherent and Normal Rate  Volume:  Normal  Mood:  Euthymic  Affect:  Congruent and Flat  Thought Process:  Coherent and Goal Directed  Orientation:  Full (Time, Place, and  Person)  Thought Content:  Logical  Suicidal Thoughts:  No  Homicidal Thoughts:  No  Memory:  Immediate;   Good Recent;   Good Remote;   Good  Judgement:  Good  Insight:  Good  Psychomotor Activity:  Normal  Concentration:  Concentration: Good  Recall:  Good  Fund of Knowledge:  Good  Language:  Good  Akathisia:  No  Handed:    AIMS (if indicated):     Assets:  Communication Skills Desire for Improvement Financial Resources/Insurance Housing Resilience Social Support  ADL's:  Intact  Cognition:  WNL  Sleep:  Number of Hours: 6.75     Treatment Plan Summary: Daily contact with patient to assess and evaluate symptoms and progress in treatment and Medication management. Pt reports improving mood and psychotic symptoms. She is tolerating restarting medications without difficulty. She feels that she will be ready for discharge tomorrow and we will anticipate administering long-acting abilify injection prior to discharge.  - Continue abilify 10mg  qDay for schizoaffective disorder - Start Abilify ER SRER 400mg  IM [id number] (administer on 08/01/17) for schizoaffective disorder - Continue amlodipine 10mg  qDay for HTN - Continue HCTZ 25mg  qDay - Continue atarax 25mg  q6h prn anxiety - Continue lamictal 25mg  BID - Continue zoloft 100mg  qhs - Continue trazodone 150mg  qhs -Encourage participation in groups and therapeutic milieu - Discharge planning will be ongoing; anticipate discharge to outpatient level of care on 08/01/17  Pennelope Bracken, MD 07/31/2017, 3:55 PM

## 2017-07-31 NOTE — B[facility] Group Notes (Signed)
LCSW Group Therapy 07/31/2017 1:15pm  Type of Therapy and Topic:  Group Therapy:  Change and Accountability  Participation Level:  Minimal  Description of Group In this group, patients discussed power and accountability for change.  The group identified the challenges related to accountability and the difficulty of accepting the outcomes of negative behaviors.  Patients were encouraged to openly discuss a challenge/change they could take responsibility for.  Patients discussed the use of "change talk" and positive thinking as ways to support achievement of personal goals.  The group discussed ways to give support and empowerment to peers.  Therapeutic Goals: 1. Patients will state the relationship between personal power and accountability in the change process 2. Patients will identify the positive and negative consequences of a personal choice they have made 3. Patients will identify one challenge/choice they will take responsibility for making 4. Patients will discuss the role of "change talk" and the impact of positive thinking as it supports successful personal change 5. Patients will verbalize support and affirmation of change efforts in peers  Summary of Patient Progress:  Sarah [name] attended group and was there the entire time.  She chose not to share her story or the one thing that she knows to be true about her recovery.  She did appear happy and focused.  Therapeutic Modalities Solution Focused Brief Therapy Motivational Interviewing Cognitive Behavioral Therapy  Riley Lam Work 07/31/2017 1:15 PM

## 2017-07-31 NOTE — Progress Notes (Signed)
DAR NOTE: Patient presents with flat affect and depressed mood.  Denies pain, auditory and visual hallucinations.  Described energy level as normal and concentration as good.  Rates depression at 0, hopelessness at 0, and anxiety at 0.  Maintained on routine safety checks.  Medications given as prescribed.  Support and encouragement offered as needed.  Attended group and participated.  States goal for today is "work on Radiographer, therapeutic."  Patient is withdrawn and isolates.  No interaction with staff or peers.  Offered no complaint.

## 2017-07-31 NOTE — Progress Notes (Signed)
  DATA ACTION RESPONSE  Objective- Pt. is visible in the dayroom, seen eating a snack. Presents with a depressed affect and mood. No further c/o. No abnormal s/s. Pt rates day 10/10 and states she hopes to be d/c soon.  Subjective- Denies having any SI/HI/AVH/Pain at this time. Is cooperative and remains safe and pleasant  on the unit.  1:1 interaction in private to establish rapport. Encouragement, education, & support given from staff.    Safety maintained with Q 15 checks. Continue with POC.

## 2017-08-01 LAB — PROLACTIN: PROLACTIN: 17.5 ng/mL (ref 4.8–23.3)

## 2017-08-01 MED ORDER — HYDROCHLOROTHIAZIDE 25 MG PO TABS: 25.0000 mg | ORAL_TABLET | Freq: Every day | ORAL | 0 refills | Status: DC

## 2017-08-01 MED ORDER — LAMOTRIGINE 25 MG PO TABS: 25.0000 mg | ORAL_TABLET | Freq: Two times a day (BID) | ORAL | 0 refills | Status: DC

## 2017-08-01 MED ORDER — HYDROXYZINE HCL 25 MG PO TABS: 25.0000 mg | ORAL_TABLET | Freq: Four times a day (QID) | ORAL | 0 refills | Status: DC | PRN

## 2017-08-01 MED ORDER — TRAZODONE HCL 150 MG PO TABS: 150.0000 mg | ORAL_TABLET | Freq: Every day | ORAL | 0 refills | Status: DC

## 2017-08-01 MED ORDER — ARIPIPRAZOLE 10 MG PO TABS: 10.0000 mg | ORAL_TABLET | Freq: Every day | ORAL | 0 refills | Status: DC

## 2017-08-01 MED ORDER — AMLODIPINE BESYLATE 10 MG PO TABS: 10.0000 mg | ORAL_TABLET | Freq: Every day | ORAL | 0 refills | Status: DC

## 2017-08-01 MED ORDER — SERTRALINE HCL 100 MG PO TABS: 100.0000 mg | ORAL_TABLET | Freq: Every day | ORAL | 0 refills | Status: DC

## 2017-08-01 MED ORDER — ARIPIPRAZOLE ER 400 MG IM SRER: 400.0000 mg | INTRAMUSCULAR | 0 refills | Status: DC

## 2017-08-01 NOTE — Progress Notes (Signed)
  [facility] Adult Case Management Discharge Plan :  Will you be returning to the same living situation after discharge:  Yes,  Home  At discharge, do you have transportation home?: Yes,  [employer] Do you have the ability to pay for your medications: Yes,  MCD  Release of information consent forms completed and in the chart;  Patient's signature needed at discharge.  Patient to Follow up at: Follow-up Information    [facility] Follow up on 08/06/2017.   Specialty:  Behavioral Health Why:  Follow up appointment is on 08/06/2017 You will see Claiborne Billings at 2pm for intake and Jane at 3pm for counseling You have signed up for TCT with Jashella.  she can be reached at [place] information: [place] [address] [phone]           Next level of care provider has access to [place] and Suicide Prevention discussed: Yes,  Yes  Have you used any form of tobacco in the last 30 days? (Cigarettes, Smokeless Tobacco, Cigars, and/or Pipes): Yes  Has patient been referred to the Quitline?: Patient refused referral  Patient has been referred for addiction treatment: Pt. refused referral  Darleen Crocker, Student-Social Work 08/01/2017, 12:53 PM

## 2017-08-01 NOTE — Progress Notes (Signed)
Patient discharged to lobby. Patient was stable and appreciative at that time. All papers and prescriptions were given and valuables returned. Verbal understanding expressed. Denies SI/HI and A/VH. Patient given opportunity to express concerns and ask questions.  

## 2017-08-01 NOTE — Discharge Summary (Signed)
Physician Discharge Summary Note  Patient:  Sarah [name] is an 41 y.o., female MRN:  [id number] DOB:  09-14-1976 Patient phone:  [phone] (home)  Patient address:   [place] [address],  Total Time spent with patient: Greater than 30 minutes  Date of Admission:  07/29/2017 Date of Discharge: 08-01-17  Reason for Admission: Worsening psychosis & suicidal ideations.  Principal Problem: Schizoaffective disorder, depressive-type.  Discharge Diagnoses: Patient Active Problem List   Diagnosis Date Noted  . Schizoaffective disorder, depressive type ([place]) [F25.1] 01/15/2012    Priority: High    Class: Acute  . Schizoaffective disorder ([place]) [F25.9] 07/29/2017  . MDD (major depressive disorder), recurrent episode, severe ([place]) [F33.2] 02/19/2017  . Schizoaffective disorder, bipolar type ([place]) [F25.0] 01/06/2017  . 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 IIH (idiopathic intracranial hypertension) [G93.2] 01/24/2016  . Cannabis use disorder, moderate, dependence ([place]) [F12.20] 08/18/2015   Past Psychiatric History: Schizoaffective disorder, depressive-type, Cannabis use disorder.  Past Medical History:  Past Medical History:  Diagnosis Date  . Anemia   . Anxiety   . Depression   . Elevated bilirubin    slight elevation at 1.3  . Hypertension   . Hypokalemia   . Intracranial hypertension   . Migraines   . Mild intellectual disability   . Schizoaffective disorder [facility])     Past Surgical History:  Procedure Laterality Date  . NO PAST SURGERIES     Family History:  Family History  Problem Relation Age of Onset  . Schizophrenia Mother   . Hypertension Mother   . Anesthesia problems Neg Hx   . Malignant hyperthermia Neg Hx   . Pseudochol deficiency Neg Hx   . Hypotension Neg Hx   . Migraines Neg Hx    Family Psychiatric  History: See H&P.  Social History:  History  Alcohol Use No    Comment: denied all drug/alcohol use     History   Drug Use  . Types: Marijuana    Comment: denied all drug/alcohol use    Social History   Social History  . Marital status: Single    Spouse name: N/A  . Number of children: 0  . Years of education: 12   Occupational History  . Unemployed    Social History Main Topics  . Smoking status: Current Every Day Smoker    Packs/day: 1.00    Years: 15.00    Types: Cigarettes    Last attempt to quit: 01/02/2017  . Smokeless tobacco: Never Used     Comment: smoked since 41 yrs old  . Alcohol use No     Comment: denied all drug/alcohol use  . Drug use: Yes    Types: Marijuana     Comment: denied all drug/alcohol use  . Sexual activity: Yes    Birth control/ protection: None   Other Topics Concern  . None   Social History Narrative   Lives with fiance, Sarah [name]   Caffeine use: Soda: 1 soda daily   No coffee   No tea    Hospital Course: This is one of several admission assessments from this hospital alone for this 41 year old Caucasian female with hx of chronic mental illness. Patient is known in this hospital from previous psychiatric hospitalizations for mood stabilization treatments. She returns to the hospital with complaints of suicidal ideations  (Per Md's admission SRA): Sarah [name] is a 41 y/o F with history of schizoaffective disorder who was admitted voluntarily with worsening symptoms  of psychosis and SI. Pt reports that she began to have a one day history of worsened CAH which told her to kill herself via an overdose. Pt denies having SI of her own, and she was concerned about her CAH, so she called emergency services. Pt also reports that she was experiencing VH of seeing "ghosts." She denies current SI/HI/AH/VH. She notes that she had been receiving her medications through [facility], but she has not followed up in several months. She was supposed to be receiving Abilify Maintena 400mg  q28 Days, and she last received it during and inpatient stay at [facility] about 1 month  ago, but pt is unsure of the exact date. Pt states that there was not a specific stressor which contributed to her worsened CAH and depressed mood aside from being off of her medications. She notes that her sleep has been good and her appetite has been adequate. We discussed option of restarting her medications including oral abilify 10mg  qDay with plan to verify last dose of injectable abilify and administer a dose if she is due. Pt was in agreement with this plan and she had no further questions, comments, or concerns.   [name] was admitted to the [facility] adult unit for worsening symptoms of Schizoaffective disorder related to being off of her medicines for an unknown amount of time. She presented to the hospital with worsening psychosis & suicidal ideations with plans to overdose on medications. After evaluation of her symptoms, it was determined that she was in need of mood stabilization treatment. During the course of her hospitalization, Sarah [name] received & was discharged on, Abilify 10 mg for mood control for mood control, Abilify ER 400 mg IM Q 28 days for mood control, Hydroxyzine 25 mg prn for anxiety, Sertraline 100 mg for depression & Trazodone 150 mg for insomnia. She was enrolled & encouraged to participate in the group counseling sessions being offered & held on this unit. She learned coping skills. She presented other significant pre-existing medical problems that required treatment.Sarah [name] was resumed on all her pertinent home medications for those health issues. She tolerated her treatment regimen without any adverse effects or reactions reported.  During her hospital stay, Sarah [name] was evaluated daily by a clinical provider to ascertain her response to her treatment regimen. As the days go by, improvement was noted as evidenced by her report of decreasing symptoms, improved sleep, mood, affect & participation in the group sessions. With her daily presentation, interaction with peers & staff showed  that her symptoms responded well to her treatment regimen. Also, being in a therapeutic & supportive environment assisted in her mood stability.   On this day of her hospital discharge, Sarah [name] was in much improved condition than upon admission. Her symptoms were reported as significantly decreased or resolved completely. Upon discharge, she denies SI/HI and voiced no AVH. She was motivated to continue taking medication with a goal of continued improvement in mental health. She is discharged to follow-up care on an outpatient basis as noted below. She is provided with all the necessary information needed to make this appointment without problems. She left [facility] in no apparent distress with all personal belongings. Transportation per city bus. [place] assisted with bus pass..  Physical Findings: AIMS: Facial and Oral Movements Muscles of Facial Expression: None, normal Lips and Perioral Area: None, normal Jaw: None, normal Tongue: None, normal,Extremity Movements Upper (arms, wrists, hands, fingers): None, normal Lower (legs, knees, ankles, toes): None, normal, Trunk Movements Neck, shoulders, hips: None, normal, Overall  Severity Severity of abnormal movements (highest score from questions above): None, normal Incapacitation due to abnormal movements: None, normal Patient's awareness of abnormal movements (rate only patient's report): No Awareness, Dental Status Current problems with teeth and/or dentures?: No Does patient usually wear dentures?: No  CIWA:  CIWA-Ar Total: 0 COWS:  COWS Total Score: 0  Musculoskeletal: Strength & Muscle Tone: within normal limits Gait & Station: normal Patient leans: Right  Psychiatric Specialty Exam: Physical Exam  Constitutional: She appears well-developed.  HENT:  Head: Normocephalic.  Eyes: Pupils are equal, round, and reactive to light.  Cardiovascular:  Elevated pulse rate  Respiratory: Effort normal.  GI: Soft.  Genitourinary:  Genitourinary  Comments: Deferred  Musculoskeletal: Normal range of motion.  Neurological: She is alert.  Skin: Skin is warm.    Review of Systems  Constitutional: Negative.   HENT: Negative.   Eyes: Negative.   Respiratory: Negative.   Cardiovascular: Negative.   Gastrointestinal: Negative.   Genitourinary: Negative.   Musculoskeletal: Negative.   Skin: Negative.   Neurological: Negative.   Endo/Heme/Allergies: Negative.   Psychiatric/Behavioral: Positive for depression (Stabilized with medication prior to discharge), hallucinations (Hx. Psychosis) and substance abuse (Hx. Cannabis use disorder). Negative for memory loss and suicidal ideas. The patient has insomnia (Stabilized with medication prior to discharge). The patient is not nervous/anxious.     Blood pressure 120/67, pulse (!) 109, temperature 98.1 F (36.7 C), resp. rate 16, height 5\' 10"  (1.778 m), weight 108.9 kg (240 lb), last menstrual period 07/07/2017.Body mass index is 34.44 kg/m.  See Md's SRA.   Have you used any form of tobacco in the last 30 days? (Cigarettes, Smokeless Tobacco, Cigars, and/or Pipes): Yes  Has this patient used any form of tobacco in the last 30 days? (Cigarettes, Smokeless Tobacco, Cigars, and/or Pipes): No  Blood Alcohol level:  Lab Results  Component Value Date   ETH <10 07/28/2017   ETH <5 24/23/5361   Metabolic Disorder Labs:  Lab Results  Component Value Date   HGBA1C 5.1 07/31/2017   MPG 99.67 07/31/2017   MPG 108 01/04/2017   Lab Results  Component Value Date   PROLACTIN 17.5 07/31/2017   PROLACTIN 18.7 01/04/2017   Lab Results  Component Value Date   CHOL 179 01/04/2017   TRIG 158 (H) 01/04/2017   HDL 58 01/04/2017   CHOLHDL 3.1 01/04/2017   VLDL 32 01/04/2017   LDLCALC 89 01/04/2017   LDLCALC 72 08/19/2015   See Psychiatric Specialty Exam and Suicide Risk Assessment completed by Attending Physician prior to discharge.  Discharge destination:  Home  Is patient on multiple  antipsychotic therapies at discharge:  No   Has Patient had three or more failed trials of antipsychotic monotherapy by history:  No  Recommended Plan for Multiple Antipsychotic Therapies: NA  Allergies as of 08/01/2017      Reactions   Penicillins Anaphylaxis   Has patient had a PCN reaction causing immediate rash, facial/tongue/throat swelling, SOB or lightheadedness with hypotension: throat and face swelling. YES Has patient had a PCN reaction causing severe rash involving mucus membranes or skin necrosis: Yes Has patient had a PCN reaction that required hospitalization Yes Has patient had a PCN reaction occurring within the last 10 years: No If all of the above answers are "NO", then may proceed with Cephalosporin use.   Bee Venom Swelling   Coconut Oil Hives, Swelling   ALLERGIES TO COCONUT      Medication List    STOP taking these  medications   ARIPiprazole ER 400 MG Prsy Replaced by:  ARIPiprazole ER 400 MG Srer You also have another medication with the same name that you need to continue taking as instructed.   nicotine 21 mg/24hr patch Commonly known as:  NICODERM CQ - dosed in mg/24 hours     TAKE these medications     Indication  amLODipine 10 MG tablet Commonly known as:  NORVASC Take 1 tablet (10 mg total) by mouth daily. For high blood pressure  Indication:  High Blood Pressure Disorder   ARIPiprazole 10 MG tablet Commonly known as:  ABILIFY Take 1 tablet (10 mg total) by mouth daily. For mood control What changed:  Another medication with the same name was added. Make sure you understand how and when to take each.  Another medication with the same name was removed. Continue taking this medication, and follow the directions you see here.  Indication:  Mixed Bipolar Affective Disorder   ARIPiprazole ER 400 MG Srer Inject 400 mg into the muscle every 28 (twenty-eight) days. (Due on 08-29-17): For mood control What changed:  You were already taking a  medication with the same name, and this prescription was added. Make sure you understand how and when to take each. Replaces:  ARIPiprazole ER 400 MG Prsy  Indication:  Mood control   hydrochlorothiazide 25 MG tablet Commonly known as:  HYDRODIURIL Take 1 tablet (25 mg total) by mouth daily. For high blood pressure What changed:  additional instructions  Indication:  High Blood Pressure Disorder   hydrOXYzine 25 MG tablet Commonly known as:  ATARAX/VISTARIL Take 1 tablet (25 mg total) by mouth every 6 (six) hours as needed for anxiety.  Indication:  Feeling Anxious   lamoTRIgine 25 MG tablet Commonly known as:  LAMICTAL Take 1 tablet (25 mg total) by mouth 2 (two) times daily. For mood stabilization What changed:  additional instructions  Indication:  Mood stabilization   sertraline 100 MG tablet Commonly known as:  ZOLOFT Take 1 tablet (100 mg total) by mouth at bedtime. For depression  Indication:  Major Depressive Disorder   traZODone 150 MG tablet Commonly known as:  DESYREL Take 1 tablet (150 mg total) by mouth at bedtime. For sleep What changed:  additional instructions  Another medication with the same name was removed. Continue taking this medication, and follow the directions you see here.  Indication:  Trouble Sleeping      Follow-up Information    [facility] Follow up on 08/06/2017.   Specialty:  Behavioral Health Why:  Follow up appointment is on 08/06/2017 You will see Claiborne Billings at 2pm for intake and Jane at 3pm for counseling You have signed up for TCT with Jashella.  she can be reached at [place] information: [place] [address] [phone]          Follow-up recommendations:  Activity:  As tolerated Diet: As recommended by your primary care doctor. Keep all scheduled follow-up appointments as recommended.  Comments: Patient is instructed prior to discharge to: Take all medications as prescribed by his/her mental  healthcare provider. Report any adverse effects and or reactions from the medicines to his/her outpatient provider promptly. Patient has been instructed & cautioned: To not engage in alcohol and or illegal drug use while on prescription medicines. In the event of worsening symptoms, patient is instructed to call the crisis hotline, 911 and or go to the nearest ED for appropriate evaluation and treatment of symptoms. To follow-up  with his/her primary care provider for your other medical issues, concerns and or health care needs.   Signed: Encarnacion Slates, NP, PMHNP, FNP-BC. 08/01/2017, 10:43 AM   Patient seen, Suicide Assessment Completed.  Disposition Plan Reviewed   Nikoletta Varma is a 41 y/o F with history of schizoaffective disorder who presented with worsening depression, AH, and SI in the context of being off of her psychotropic medications. Pt was restarted on previous medication of abilify and she had improvement of symptoms while admitted to [facility]. Today she denies SI/HI/AH/VH. She is tolerating her medications without difficulty. She reports that her mood is "good." She is sleeping well and her appetite is good. She is tolerating her medications without difficulty or side effect, and the plan is for the patient to receive long-acting injectable form of abilify prior to her discharge (tolerability previously established as pt was on Maintena in the recent months). At time of evaluation, pt was able to participate in safety planning and she was in agreement to return to [facility] or contact emergency services if she felt unable to maintain her own safety. She had no further questions, comments, or concerns.    Plan Of Care/Follow-up recommendations:  -Schizoaffective disorder             - Continue abilify 10mg  po qDay             - Continue Abilify ER SRER 400mg  IM q28Days (injection to be given 08/01/17 prior to DC)             - Continue lamictal 25mg  BID             - Continue zoloft 100mg   qhs -Anxiety             -Continue atarax 25mg  q6h prn anxiety -Insomnia             - continue trazodone 150mg  qhs - Hypertension             - Continue HCTZ 25mg  qDay             - Continue amlodipine 10mg  qDay  Activity:  as tolerated Diet:  normal Tests:  NA Other:  see above for DC treatment plan  Pennelope Bracken, MD

## 2017-08-01 NOTE — Plan of Care (Signed)
Problem: [facility] Participation in Recreation Therapeutic Interventions Goal: STG-Patient will identify at least five coping skills for ** STG: Coping Skills - Patient will be able to identify at least 5 coping skills for SI and hearing voices  by conclusion of recreation therapy tx  Outcome: Not Met (add Reason) Pt did not attend group.  Victorino Sparrow, LRT/CTRS

## 2017-08-01 NOTE — [facility] Suicide Risk Assessment (Signed)
[facility] Discharge Suicide Risk Assessment   Principal Problem: Schizoaffective disorder [facility]) Discharge Diagnoses:  Patient Active Problem List   Diagnosis Date Noted  . Schizoaffective disorder ([place]) [F25.9] 07/29/2017  . MDD (major depressive disorder), recurrent episode, severe ([place]) [F33.2] 02/19/2017  . Schizoaffective disorder, bipolar type ([place]) [F25.0] 01/06/2017  . 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 IIH (idiopathic intracranial hypertension) [G93.2] 01/24/2016  . Cannabis use disorder, moderate, dependence ([place]) [F12.20] 08/18/2015  . Schizoaffective disorder, depressive type ([place]) [F25.1] 01/15/2012    Class: Acute    Total Time spent with patient: 30 minutes  Musculoskeletal: Strength & Muscle Tone: within normal limits Gait & Station: normal Patient leans: N/A  Psychiatric Specialty Exam: ROS  Blood pressure 120/67, pulse (!) 109, temperature 98.1 F (36.7 C), resp. rate 16, height 5\' 10"  (1.778 m), weight 108.9 kg (240 lb), last menstrual period 07/07/2017.Body mass index is 34.44 kg/m.  General Appearance: Casual and Fairly Groomed  Engineer, water::  Good  Speech:  Clear and Coherent and Normal Rate  Volume:  Normal  Mood:  Euthymic  Affect:  Congruent and Flat  Thought Process:  Coherent  Orientation:  Full (Time, Place, and Person)  Thought Content:  Logical  Suicidal Thoughts:  No  Homicidal Thoughts:  No  Memory:  Immediate;   Good Recent;   Good Remote;   Good  Judgement:  Good  Insight:  Fair  Psychomotor Activity:  Normal  Concentration:  Good  Recall:  Good  Fund of Knowledge:Good  Language: Good  Akathisia:  No  Handed:    AIMS (if indicated):     Assets:  Communication Skills Desire for Improvement Housing Resilience Social Support  Sleep:  Number of Hours: 6.5  Cognition: WNL  ADL's:  Intact   Mental Status Per Nursing Assessment::   On Admission:     Demographic Factors:  Low socioeconomic status  Loss  Factors: Financial problems/change in socioeconomic status  Historical Factors: Family history of mental illness or substance abuse  Risk Reduction Factors:   Sense of responsibility to family, Living with another person, especially a relative, Positive social support, Positive therapeutic relationship and Positive coping skills or problem solving skills  Continued Clinical Symptoms:  Schizophrenia:   Paranoid or undifferentiated type  Cognitive Features That Contribute To Risk:  None    Suicide Risk:  Mild:  Suicidal ideation of limited frequency, intensity, duration, and specificity.  There are no identifiable plans, no associated intent, mild dysphoria and related symptoms, good self-control (both objective and subjective assessment), few other risk factors, and identifiable protective factors, including available and accessible social support.  Follow-up Information    [facility] Follow up on 08/06/2017.   Specialty:  Behavioral Health Why:  Follow up appointment is on 08/06/2017 You will see Claiborne Billings at 2pm for intake and Jane at 3pm for counseling You have signed up for TCT with Jashella.  she can be reached at [place] information: [place] [address] [phone]          Subjective data: Sarah [name] is a 41 y/o F with history of schizoaffective disorder who presented with worsening depression, AH, and SI in the context of being off of her psychotropic medications. Pt was restarted on previous medication of abilify and she had improvement of symptoms while admitted to [facility]. Today she denies SI/HI/AH/VH. She is tolerating her medications without difficulty. She reports that her mood is "good." She is sleeping well and her appetite is  good. She is tolerating her medications without difficulty or side effect, and the plan is for the patient to receive long-acting injectable form of abilify prior to her discharge (tolerability previously established as pt  was on Maintena in the recent months). At time of evaluation, pt was able to participate in safety planning and she was in agreement to return to [facility] or contact emergency services if she felt unable to maintain her own safety. She had no further questions, comments, or concerns.    Plan Of Care/Follow-up recommendations:  -Schizoaffective disorder   - Continue abilify 10mg  po qDay  - Continue Abilify ER SRER 400mg  IM q28Days (injection to be given 08/01/17 prior to DC)  - Continue lamictal 25mg  BID  - Continue zoloft 100mg  qhs -Anxiety  -Continue atarax 25mg  q6h prn anxiety -Insomnia  - continue trazodone 150mg  qhs - Hypertension  - Continue HCTZ 25mg  qDay  - Continue amlodipine 10mg  qDay  Activity:  as tolerated Diet:  normal Tests:  NA Other:  see above for DC treatment plan  Pennelope Bracken, MD 08/01/2017, 1:12 PM

## 2017-08-01 NOTE — Progress Notes (Signed)
Recreation Therapy Notes  Date: 08/01/17 Time: 1000 Location:  [address] Hall  Group Topic: Communication  Goal Area(s) Addresses:  Patient will effectively communicate with peers in group.  Patient will verbalize benefit of healthy communication. Patient will verbalize positive effect of healthy communication on post d/c goals.  Patient will identify communication techniques that made activity effective for group.   Intervention:  Rubber discs  Activity: Traffic Jam.  Patients were given one rubber disc each, plus one extra for the group.  Patients were to use the discs to travel as a group from one end of the hall to the other and back.  If any person stepped off their disc, the group would have to start from the beginning.    Education:Communication, Discharge Planning  Education Outcome: Acknowledges understanding/In group clarification offered/Needs additional education.   Clinical Observations/Feedback: Pt did not attend group.   Victorino Sparrow, LRT/CTRS        Victorino Sparrow A 08/01/2017 12:26 PM

## 2017-08-01 NOTE — Progress Notes (Signed)
Patient discharged with a cab voucher.  [employer] called and address given.

## 2017-08-01 NOTE — Progress Notes (Signed)
Recreation Therapy Notes  INPATIENT RECREATION TR PLAN  Patient Details Name: Sarah [name] MRN: [id number] DOB: 03-Oct-1976 Today's Date: 08/01/2017  Rec Therapy Plan Is patient appropriate for Therapeutic Recreation?: Yes Treatment times per week: about 3 days  Estimated Length of Stay: 5-7 days TR Treatment/Interventions: Group participation (Comment)  Discharge Criteria Pt will be discharged from therapy if:: Discharged Treatment plan/goals/alternatives discussed and agreed upon by:: Patient/family  Discharge Summary Short term goals set: Patient will be able to identify at least 5 coping skills for SI and hearing voices  by conclusion of recreation therapy tx  Short term goals met: Not met Progress toward goals comments: Groups attended Which groups?: Self-esteem Reason goals not met: Pt did not attend group for coping skills. Therapeutic equipment acquired: N/A Reason patient discharged from therapy: Discharge from hospital Pt/family agrees with progress & goals achieved: Yes Date patient discharged from therapy: 08/01/17   Victorino Sparrow, LRT/CTRS  [name], [name] 08/01/2017, 12:40 PM

## 2017-08-22 IMAGING — US US ABDOMEN LIMITED
1 series · 14 of 25 positions shown · non-contrast
Comparison: None.

CLINICAL DATA: Right upper quadrant pain for 1 day. Nausea and
vomiting.

EXAM:
US ABDOMEN LIMITED - RIGHT UPPER QUADRANT

[Series 1: us abdomen limited · 0.23mm/px · 14 of 36 slices shown]
[im 1/36]
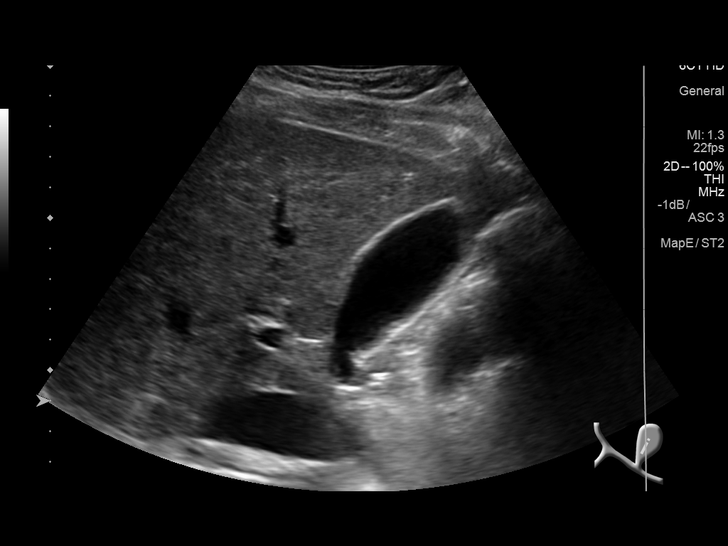
[im 3/36]
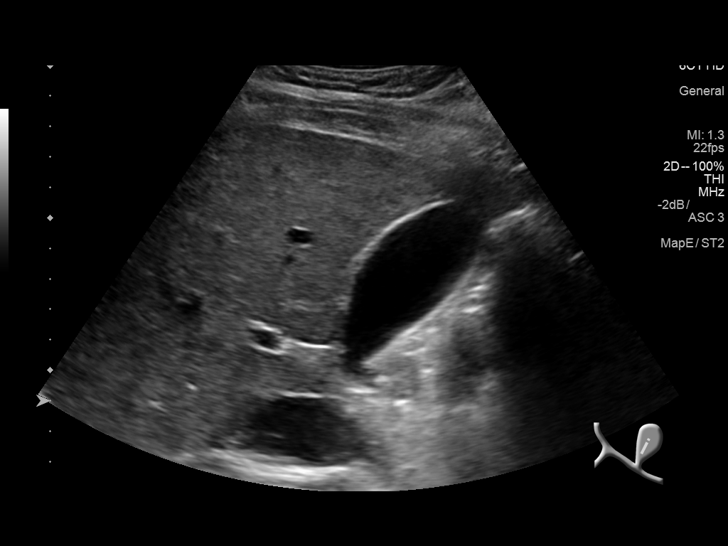
[im 6/36]
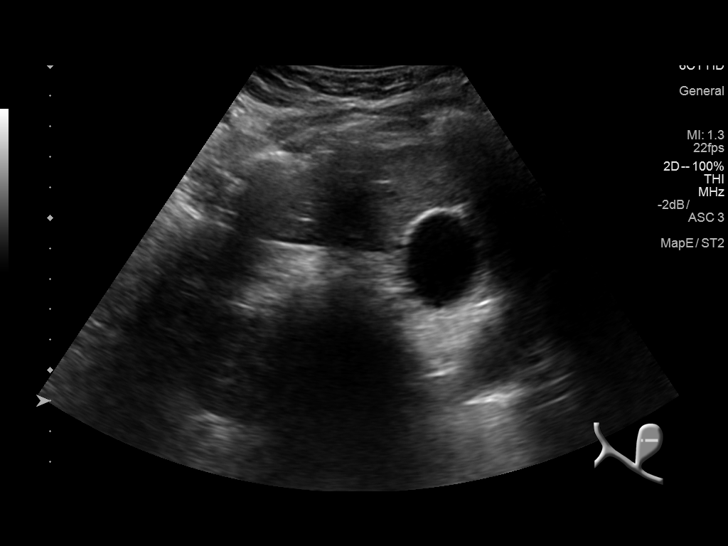
[im 9/36]
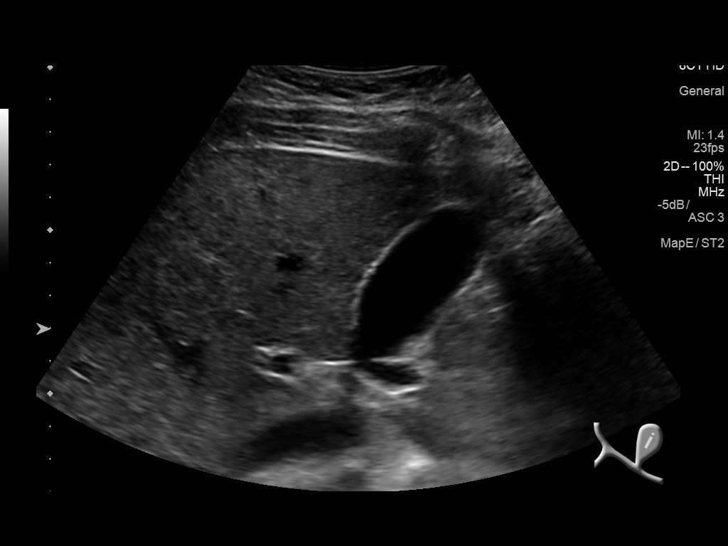
[im 12/36]
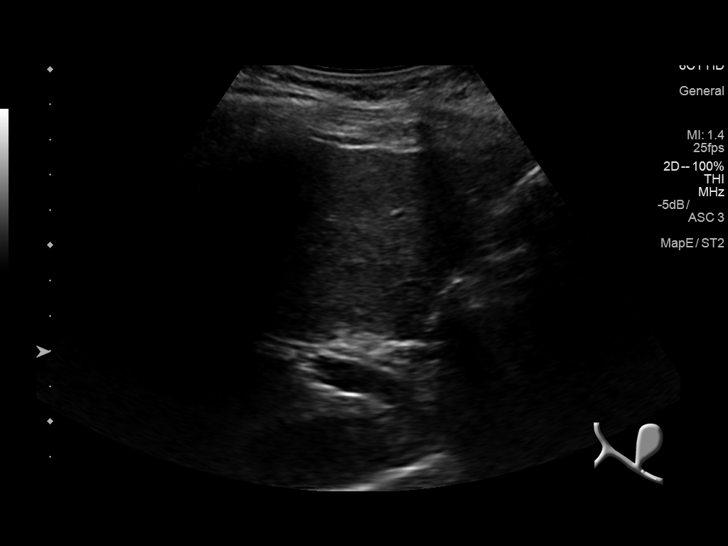
[im 14/36]
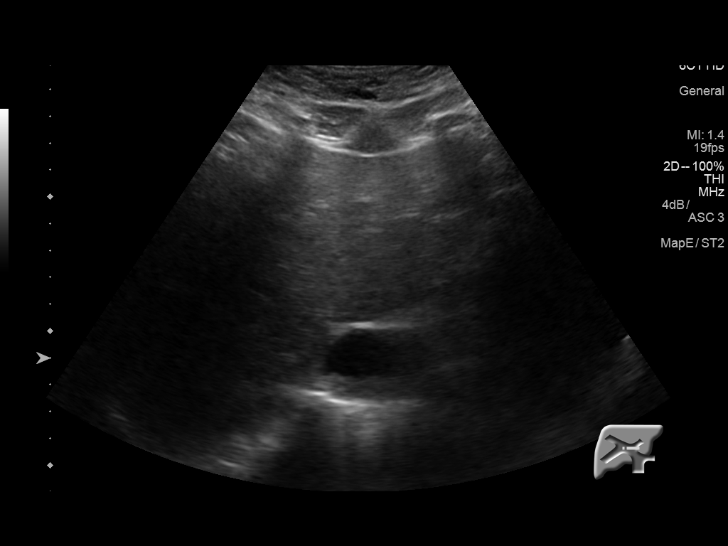
[im 17/36]
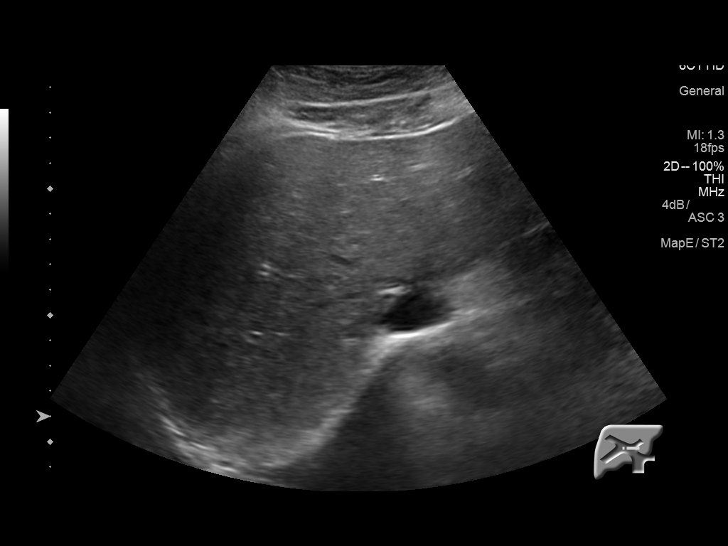
[im 19/36]
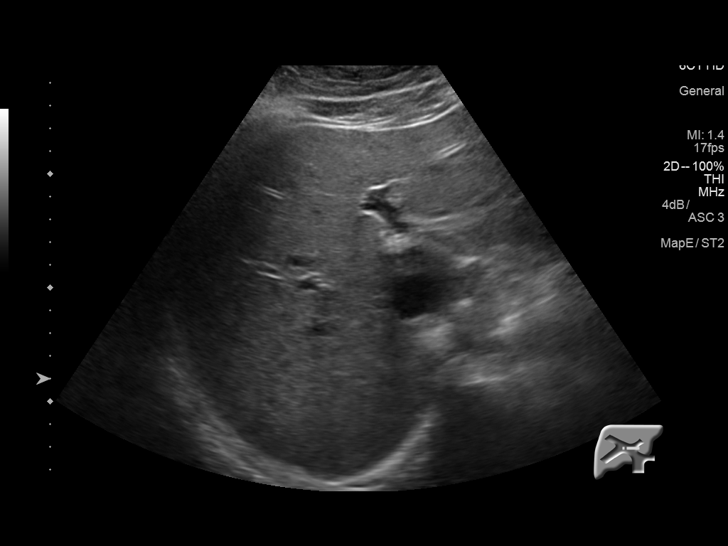
[im 22/36]
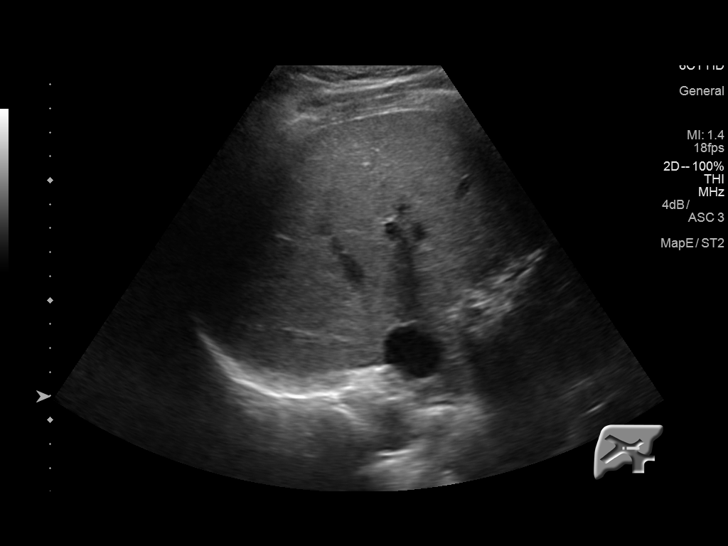
[im 24/36]
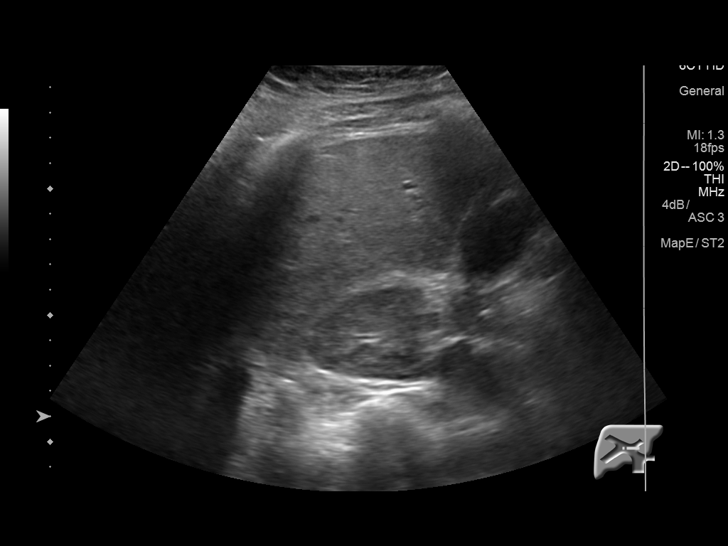
[im 27/36]
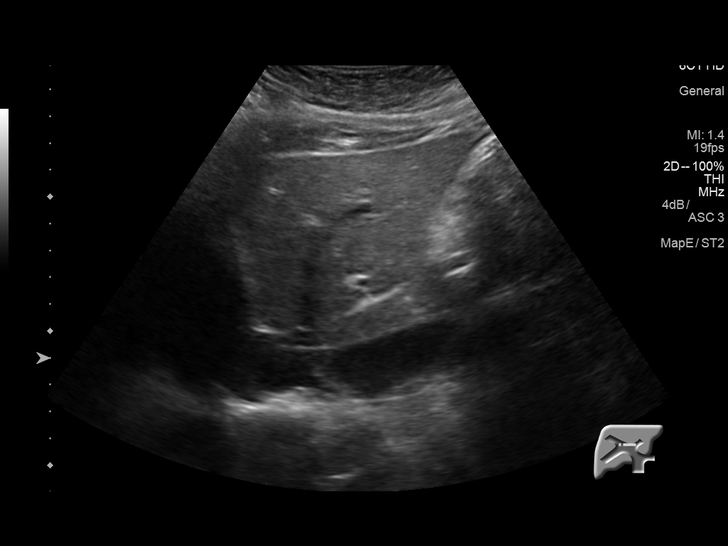
[im 30/36]
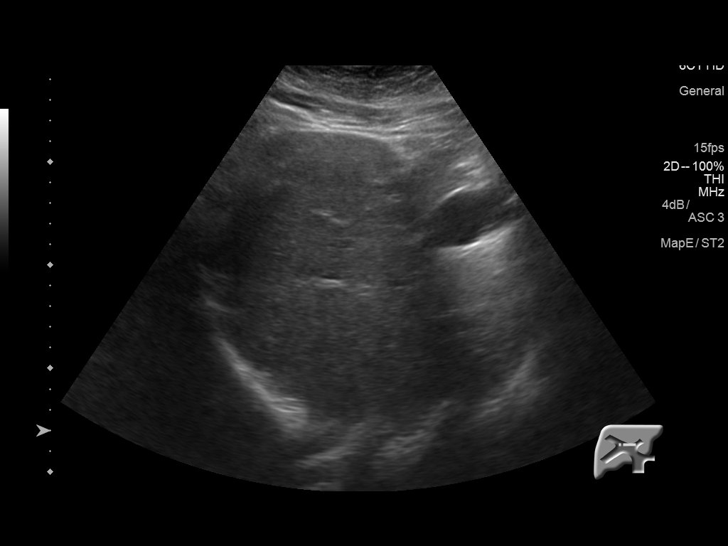
[im 33/36]
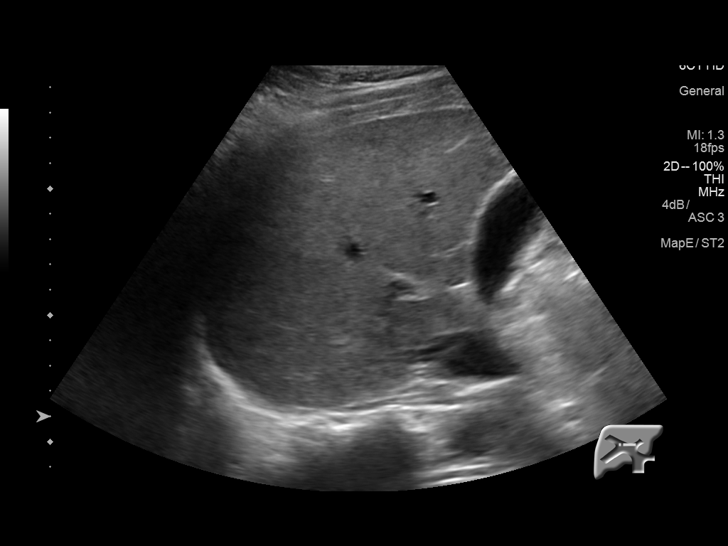
[im 36/36]
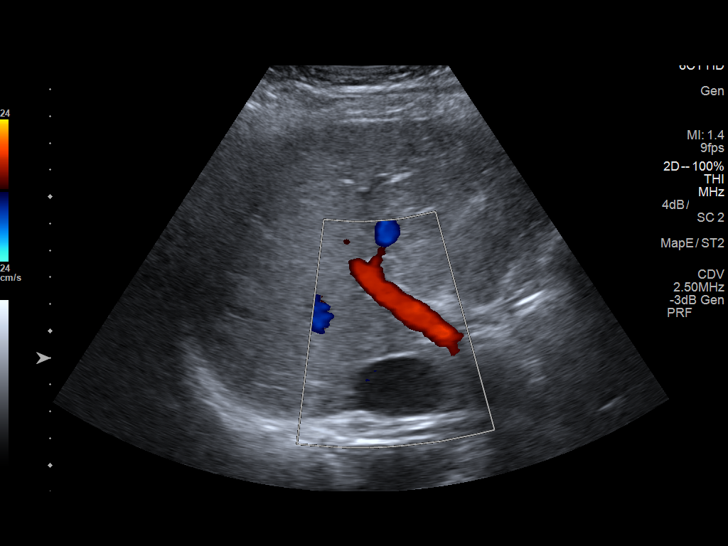

[14 of 25 positions shown; findings below may reference images not displayed]

FINDINGS: Gallbladder:

No gallstones or wall thickening visualized. No sonographic Murphy
sign noted by sonographer.

Common bile duct:

Diameter: 2.6 mm

Liver:

No focal lesion identified. Within normal limits in parenchymal
echogenicity.
IMPRESSION: Normal liver, gallbladder and bile ducts

## 2017-09-01 ENCOUNTER — Emergency Department (HOSPITAL_COMMUNITY)
Admission: EM | Admit: 2017-09-01 | Discharge: 2017-09-02 | Disposition: A | Discharge status: Home / Self Care | Payer: Medicaid Other | Attending: Emergency Medicine | Admitting: Emergency Medicine

## 2017-09-01 ENCOUNTER — Encounter (HOSPITAL_COMMUNITY): Payer: Self-pay

## 2017-09-01 DIAGNOSIS — F70 Mild intellectual disabilities: Secondary | ICD-10-CM | POA: Diagnosis not present

## 2017-09-01 DIAGNOSIS — I10 Essential (primary) hypertension: Secondary | ICD-10-CM | POA: Diagnosis not present

## 2017-09-01 DIAGNOSIS — F259 Schizoaffective disorder, unspecified: Secondary | ICD-10-CM | POA: Insufficient documentation

## 2017-09-01 DIAGNOSIS — R443 Hallucinations, unspecified: Secondary | ICD-10-CM

## 2017-09-01 DIAGNOSIS — F250 Schizoaffective disorder, bipolar type: Secondary | ICD-10-CM

## 2017-09-01 DIAGNOSIS — F251 Schizoaffective disorder, depressive type: Secondary | ICD-10-CM | POA: Diagnosis not present

## 2017-09-01 DIAGNOSIS — Z79899 Other long term (current) drug therapy: Secondary | ICD-10-CM | POA: Diagnosis not present

## 2017-09-01 DIAGNOSIS — R441 Visual hallucinations: Secondary | ICD-10-CM | POA: Diagnosis present

## 2017-09-01 DIAGNOSIS — F1210 Cannabis abuse, uncomplicated: Secondary | ICD-10-CM | POA: Diagnosis not present

## 2017-09-01 DIAGNOSIS — F17210 Nicotine dependence, cigarettes, uncomplicated: Secondary | ICD-10-CM | POA: Diagnosis not present

## 2017-09-01 DIAGNOSIS — R45851 Suicidal ideations: Secondary | ICD-10-CM | POA: Insufficient documentation

## 2017-09-01 DIAGNOSIS — Z818 Family history of other mental and behavioral disorders: Secondary | ICD-10-CM | POA: Diagnosis not present

## 2017-09-01 LAB — CBC
HCT: 35.7 % — ABNORMAL LOW (ref 36.0–46.0)
Hemoglobin: 11.4 g/dL — ABNORMAL LOW (ref 12.0–15.0)
MCH: 27 pg (ref 26.0–34.0)
MCHC: 31.9 g/dL (ref 30.0–36.0)
MCV: 84.6 fL (ref 78.0–100.0)
Platelets: 419 10*3/uL — ABNORMAL HIGH (ref 150–400)
RBC: 4.22 MIL/uL (ref 3.87–5.11)
RDW: 17.2 % — ABNORMAL HIGH (ref 11.5–15.5)
WBC: 5.5 10*3/uL (ref 4.0–10.5)

## 2017-09-01 LAB — I-STAT BETA HCG BLOOD, ED (MC, WL, AP ONLY): I-stat hCG, quantitative: <5 m[IU]/mL (ref <5)

## 2017-09-01 LAB — COMPREHENSIVE METABOLIC PANEL
ALT: 14 U/L (ref 14–54)
AST: 22 U/L (ref 15–41)
Albumin: 4 g/dL (ref 3.5–5.0)
Alkaline Phosphatase: 79 U/L (ref 38–126)
Anion gap: 5 (ref 5–15)
BUN: 16 mg/dL (ref 6–20)
CO2: 27 mmol/L (ref 22–32)
Calcium: 8.9 mg/dL (ref 8.9–10.3)
Chloride: 106 mmol/L (ref 101–111)
Creatinine, Ser: 0.74 mg/dL (ref 0.44–1.00)
GFR calc Af Amer: >60 mL/min (ref >60)
GFR calc non Af Amer: >60 mL/min (ref >60)
Glucose, Bld: 89 mg/dL (ref 65–99)
Potassium: 3.7 mmol/L (ref 3.5–5.1)
Sodium: 138 mmol/L (ref 135–145)
Total Bilirubin: 1.2 mg/dL (ref 0.3–1.2)
Total Protein: 7.5 g/dL (ref 6.5–8.1)

## 2017-09-01 LAB — ACETAMINOPHEN LEVEL: Acetaminophen (Tylenol), Serum: <10 ug/mL — ABNORMAL LOW (ref 10–30)

## 2017-09-01 LAB — ETHANOL: Alcohol, Ethyl (B): <10 mg/dL (ref <10)

## 2017-09-01 LAB — SALICYLATE LEVEL: Salicylate Lvl: <7 mg/dL (ref 2.8–30.0)

## 2017-09-01 MED ORDER — ONDANSETRON HCL 4 MG PO TABS: 4.0000 mg | ORAL_TABLET | Freq: Three times a day (TID) | ORAL | Status: DC | PRN

## 2017-09-01 MED ORDER — HYDROXYZINE HCL 25 MG PO TABS
25.0000 mg | ORAL_TABLET | Freq: Three times a day (TID) | ORAL | Status: DC | PRN
  Administered 2017-09-01: 25 mg via ORAL
  Filled 2017-09-01: qty 1

## 2017-09-01 MED ORDER — SERTRALINE HCL 50 MG PO TABS
50.0000 mg | ORAL_TABLET | Freq: Every day | ORAL | Status: DC
  Administered 2017-09-01 – 2017-09-02 (×2): 50 mg via ORAL
  Filled 2017-09-01 (×2): qty 1

## 2017-09-01 MED ORDER — IBUPROFEN 200 MG PO TABS: 600.0000 mg | ORAL_TABLET | Freq: Three times a day (TID) | ORAL | Status: DC | PRN

## 2017-09-01 MED ORDER — TRAZODONE HCL 50 MG PO TABS
50.0000 mg | ORAL_TABLET | Freq: Every day | ORAL | Status: DC
  Administered 2017-09-01: 50 mg via ORAL
  Filled 2017-09-01 (×2): qty 1

## 2017-09-01 MED ORDER — AMLODIPINE BESYLATE 5 MG PO TABS
10.0000 mg | ORAL_TABLET | Freq: Every day | ORAL | Status: DC
  Administered 2017-09-01 – 2017-09-02 (×2): 10 mg via ORAL
  Filled 2017-09-01 (×2): qty 2

## 2017-09-01 MED ORDER — ARIPIPRAZOLE ER 400 MG IM SRER
400.0000 mg | INTRAMUSCULAR | Status: DC
  Administered 2017-09-01: 400 mg via INTRAMUSCULAR
  Filled 2017-09-01 (×2): qty 400

## 2017-09-01 MED ORDER — ALUM & MAG HYDROXIDE-SIMETH 200-200-20 MG/5ML PO SUSP: 30.0000 mL | Freq: Four times a day (QID) | ORAL | Status: DC | PRN

## 2017-09-01 MED ORDER — HYDROCHLOROTHIAZIDE 25 MG PO TABS
25.0000 mg | ORAL_TABLET | Freq: Every day | ORAL | Status: DC
  Administered 2017-09-01 – 2017-09-02 (×2): 25 mg via ORAL
  Filled 2017-09-01 (×2): qty 1

## 2017-09-01 MED ORDER — ARIPIPRAZOLE 10 MG PO TABS
10.0000 mg | ORAL_TABLET | Freq: Every day | ORAL | Status: DC
  Administered 2017-09-01 – 2017-09-02 (×2): 10 mg via ORAL
  Filled 2017-09-01 (×2): qty 1

## 2017-09-01 NOTE — BH Assessment (Addendum)
Assessment Note  Sarah [name] is an 41 y.o. female that presents this date with thoughts of self harm but no plan or intent. Patient reports she is having active AVH "seeing friends that have passed" and command voices that are telling her to harm herself. Patient reports that she has no plan or intent. Patient states she has been off her medications for over a month and has not been able to see her provider [name]) to receive those medications. Patient is time/place oriented and denies any H/I. Per notes, patient presented earlier this date to the emergency department with suicidal ideation and AVH that started this morning. The patient states that she has not been taking her medications as directed over the last while. Patient states she called EMS because she has been "seeing things" and "hearing voices" telling her to kill herself. Patient stated she "thought about taking pills" but realized "it was all in her head" denying any current S/I on admission. Patient stated she still "needed to come to the hospital to get right." The patient has a significant history of similar symptoms previously and multiple prior evaluations for same with last admission on 07/29/17 when patient presented with similar medication management issues and S/I. Patient denies feeling suicidal now. Her affect is appropriate and patient is pleasant as she interacts with this Probation officer. Patient denies any other MH symptoms. Patient reported multiple stressors to include family, financial and transportation issues. Patient is endorsing multiple symptoms associated with her AVH but did not report any issues with her sleep or appetite. Patient does have a ongoing history of depression but is vague in reference to her symptoms this date.Patient per note review, has a history of alcohol and cannabis use but denies this date. Patient did not report any pending criminal charges or upcoming court dates. Patient denied having access to weapons.  Patient did not report any physical, sexual or emotional abuse. Patient has a  history of schizoaffective disorder who was admitted voluntarily with worsening symptoms of psychosis and SI. Per note review on admission, "Patient notes that she had been receiving her medications through [facility], but she has not followed up in several months. She was supposed to be receiving Abilify M[place]ena patient is unsure of the exact date when she last received that medication". Case was staffed with Romilda Garret FNP who recommended patient be monitored for safety and evaluated for medication management. Patient will be seen by psychiatry in the a.m.    Diagnosis: F33.3 MDD recurrent with psychotic features, severe  Past Medical History:  Past Medical History:  Diagnosis Date  . Anemia   . Anxiety   . Depression   . Elevated bilirubin    slight elevation at 1.3  . Hypertension   . Hypokalemia   . Intracranial hypertension   . Migraines   . Mild intellectual disability   . Schizoaffective disorder [facility])     Past Surgical History:  Procedure Laterality Date  . NO PAST SURGERIES      Family History:  Family History  Problem Relation Age of Onset  . Schizophrenia Mother   . Hypertension Mother   . Anesthesia problems Neg Hx   . Malignant hyperthermia Neg Hx   . Pseudochol deficiency Neg Hx   . Hypotension Neg Hx   . Migraines Neg Hx     Social History:  reports that she has been smoking cigarettes.  She has a 15.00 pack-year smoking history. she has never used smokeless tobacco. She reports that she  uses drugs. Drug: Marijuana. She reports that she does not drink alcohol.  Additional Social History:  Alcohol / Drug Use Pain Medications: See MAR Prescriptions: See MAR Over the Counter: See MAR History of alcohol / drug use?: Yes Longest period of sobriety (when/how long): Unknown Negative Consequences of Use: (Denies this date) Withdrawal Symptoms: (Denies) Substance #1 Name of Substance 1:  Alcohol  1 - Age of First Use: Unknown 1 - Amount (size/oz): 3 to 4 12 oz beers 1 - Frequency: Three to four times a week 1 - Duration: Unknown 1 - Last Use / Amount: Pt states "sometimes last week"  CIWA: CIWA-Ar BP: (!) 169/109 Pulse Rate: 66 COWS:    Allergies:  Allergies  Allergen Reactions  . Penicillins Anaphylaxis    Has patient had a PCN reaction causing immediate rash, facial/tongue/throat swelling, SOB or lightheadedness with hypotension: throat and face swelling. YES Has patient had a PCN reaction causing severe rash involving mucus membranes or skin necrosis: Yes Has patient had a PCN reaction that required hospitalization Yes Has patient had a PCN reaction occurring within the last 10 years: No If all of the above answers are "NO", then may proceed with Cephalosporin use.   . Bee Venom Swelling  . Coconut Oil Hives and Swelling    ALLERGIES TO COCONUT    Home Medications:  (Not in a hospital admission)  OB/GYN Status:  No LMP recorded.  General Assessment Data [facility] of Assessment: [facility] TTS Assessment: In system Is this a Tele or Face-to-Face Assessment?: Face-to-Face Is this an Initial Assessment or a Re-assessment for this encounter?: Initial Assessment Marital status: Married [place] name: NA Is patient pregnant?: Unknown Pregnancy Status: Unknown Living Arrangements: Spouse/significant other, Other relatives Can pt return to current living arrangement?: Yes Admission Status: Voluntary Is patient capable of signing voluntary admission?: Yes Referral Source: Self/Family/Friend Insurance type: Medicaid  Medical Screening Exam ([place]) Medical Exam completed: Yes  Crisis Care Plan Living Arrangements: Spouse/significant other, Other relatives Legal Guardian: Other:(NA) Name of Psychiatrist: Warden/ranger Name of Therapist: [facility]  Education Status Is patient currently in school?: No Current Grade: NA Highest grade of school patient has  completed: 12 Name of school: NA Contact person: NA  Risk to self with the past 6 months Suicidal Ideation: Yes-Currently Present Has patient been a risk to self within the past 6 months prior to admission? : Yes Suicidal Intent: No Has patient had any suicidal intent within the past 6 months prior to admission? : Yes Is patient at risk for suicide?: Yes Suicidal Plan?: No Has patient had any suicidal plan within the past 6 months prior to admission? : Yes Access to Means: No What has been your use of drugs/alcohol within the last 12 months?: Denies current use Previous Attempts/Gestures: Yes How many times?: 3 Other Self Harm Risks: NA Triggers for Past Attempts: Unknown Intentional Self Injurious Behavior: None Family Suicide History: No Recent stressful life event(s): Other (Comment)(Medication IM overdue) Persecutory voices/beliefs?: No Depression: No Depression Symptoms: (NA) Substance abuse history and/or treatment for substance abuse?: No Suicide prevention information given to non-admitted patients: Not applicable  Risk to Others within the past 6 months Homicidal Ideation: No Does patient have any lifetime risk of violence toward others beyond the six months prior to admission? : No Thoughts of Harm to Others: No Current Homicidal Intent: No Current Homicidal Plan: No Access to Homicidal Means: No Identified Victim: NA History of harm to others?: No Assessment of Violence: None Noted  Violent Behavior Description: NA Does patient have access to weapons?: No Criminal Charges Pending?: No Does patient have a court date: No Is patient on probation?: No  Psychosis Hallucinations: Auditory, Visual Delusions: None noted  Mental Status Report Appearance/Hygiene: In scrubs Eye Contact: Fair Motor Activity: Freedom of movement Speech: Slow, Soft Level of Consciousness: Quiet/awake Mood: Pleasant Affect: Appropriate to circumstance Anxiety Level: Minimal Thought  Processes: Coherent, Relevant Judgement: Unimpaired Orientation: Person, Place, Time Obsessive Compulsive Thoughts/Behaviors: None  Cognitive Functioning Concentration: Decreased Memory: Recent Intact, Remote Intact IQ: (Note on mild IDD UTA) Insight: Fair Impulse Control: Fair Appetite: Good Weight Loss: 0 Weight Gain: 0 Sleep: No Change Total Hours of Sleep: 6 Vegetative Symptoms: None  ADLScreening [facility] Assessment Services) Patient's cognitive ability adequate to safely complete daily activities?: Yes Patient able to express need for assistance with ADLs?: Yes Independently performs ADLs?: Yes (appropriate for developmental age)  Prior Inpatient Therapy Prior Inpatient Therapy: Yes Prior Therapy Dates: 2018 Prior Therapy Facilty/Provider(s): [facility] Reason for Treatment: MH issues  Prior Outpatient Therapy Prior Outpatient Therapy: Yes Prior Therapy Dates: Ongoing Prior Therapy Facilty/Provider(s): [facility] Reason for Treatment: Med mang Does patient have an ACCT team?: No Does patient have Intensive In-House Services?  : No Does patient have [facility] services? : Yes Does patient have P4CC services?: No  ADL Screening (condition at time of admission) Patient's cognitive ability adequate to safely complete daily activities?: Yes Is the patient deaf or have difficulty hearing?: No Does the patient have difficulty seeing, even when wearing glasses/contacts?: No Does the patient have difficulty concentrating, remembering, or making decisions?: No Patient able to express need for assistance with ADLs?: Yes Does the patient have difficulty dressing or bathing?: No Independently performs ADLs?: Yes (appropriate for developmental age) Does the patient have difficulty walking or climbing stairs?: No Weakness of Legs: None Weakness of Arms/Hands: None  Home Assistive Devices/Equipment Home Assistive Devices/Equipment: None  Therapy Consults (therapy consults require a  physician order) PT Evaluation Needed: No OT Evalulation Needed: No SLP Evaluation Needed: No Abuse/Neglect Assessment (Assessment to be complete while patient is alone) Physical Abuse: Denies Verbal Abuse: Denies Sexual Abuse: Denies Exploitation of patient/patient's resources: Denies Self-Neglect: Denies Values / Beliefs Cultural Requests During Hospitalization: None Spiritual Requests During Hospitalization: None Consults Spiritual Care Consult Needed: No Social Work Consult Needed: No Regulatory affairs officer (For Healthcare) Does Patient Have a Medical Advance Directive?: No Would patient like information on creating a medical advance directive?: No - Patient declined    Additional Information 1:1 In Past 12 Months?: No CIRT Risk: No Elopement Risk: No Does patient have medical clearance?: Yes     Disposition: Case was staffed with Romilda Garret FNP who recommended patient be monitored for safety and evaluated for medication managements. Patient will be seen by psychiatry in the a.m.  Disposition Initial Assessment Completed for this Encounter: Yes Disposition of Patient: Other dispositions Other disposition(s): Other (Comment)(Observe and monitor for safety med eval)  On Site Evaluation by:   Reviewed with Physician:    Mamie Nick 09/01/2017 1:46 PM

## 2017-09-01 NOTE — ED Notes (Signed)
Bed: [facility] Expected date:  Expected time:  Means of arrival:  Comments: 

## 2017-09-01 NOTE — ED Provider Notes (Signed)
[place] DEPT Provider Note   CSN: [id number] Arrival date & time: 09/01/17  1751     History   Chief Complaint Chief Complaint  Patient presents with  . Suicidal  . Hallucinations    HPI Sarah [name] is a 41 y.o. female.  HPI Patient presents to the emergency department with suicidal ideation and audio and visual hallucinations that started this morning.  The patient states that she has not been taking her medications as directed over the last while.  The patient states that nothing seems to make the condition better or worse.  She states that this is happened previously to her in the past.  Patient denies any new medications.  The patient denies chest pain, shortness of breath, headache,blurred vision, neck pain, fever, cough, weakness, numbness, dizziness, anorexia, edema, abdominal pain, nausea, vomiting, diarrhea, rash, back pain, dysuria, hematemesis, bloody stool, near syncope, or syncope. Past Medical History:  Diagnosis Date  . Anemia   . Anxiety   . Depression   . Elevated bilirubin    slight elevation at 1.3  . Hypertension   . Hypokalemia   . Intracranial hypertension   . Migraines   . Mild intellectual disability   . Schizoaffective disorder [facility])     Patient Active Problem List   Diagnosis Date Noted  . Schizoaffective disorder ([place]) 07/29/2017  . MDD (major depressive disorder), recurrent episode, severe ([place]) 02/19/2017  . Schizoaffective disorder, bipolar type ([place]) 01/06/2017  . Mild intellectual disability 01/06/2017  . IIH (idiopathic intracranial hypertension) 01/24/2016  . Cannabis use disorder, moderate, dependence ([place]) 08/18/2015  . Schizoaffective disorder, depressive type ([place]) 01/15/2012    Class: Acute    Past Surgical History:  Procedure Laterality Date  . NO PAST SURGERIES      OB History    No data available       Home Medications    Prior to Admission medications   Medication Sig  Start Date End Date Taking? Authorizing Provider  amLODipine (NORVASC) 10 MG tablet Take 1 tablet (10 mg total) by mouth daily. For high blood pressure 08/01/17   Nwoko, Herbert Pun I, NP  ARIPiprazole (ABILIFY) 10 MG tablet Take 1 tablet (10 mg total) by mouth daily. For mood control 08/01/17   Lindell Spar I, NP  ARIPiprazole ER 400 MG SRER Inject 400 mg into the muscle every 28 (twenty-eight) days. (Due on 08-29-17): For mood control 08/29/17   Lindell Spar I, NP  hydrochlorothiazide (HYDRODIURIL) 25 MG tablet Take 1 tablet (25 mg total) by mouth daily. For high blood pressure 08/02/17   Lindell Spar I, NP  hydrOXYzine (ATARAX/VISTARIL) 25 MG tablet Take 1 tablet (25 mg total) by mouth every 6 (six) hours as needed for anxiety. 08/01/17   Lindell Spar I, NP  lamoTRIgine (LAMICTAL) 25 MG tablet Take 1 tablet (25 mg total) by mouth 2 (two) times daily. For mood stabilization 08/01/17   Lindell Spar I, NP  sertraline (ZOLOFT) 100 MG tablet Take 1 tablet (100 mg total) by mouth at bedtime. For depression 08/01/17   Lindell Spar I, NP  traZODone (DESYREL) 150 MG tablet Take 1 tablet (150 mg total) by mouth at bedtime. For sleep 08/01/17   Lindell Spar I, NP    Family History Family History  Problem Relation Age of Onset  . Schizophrenia Mother   . Hypertension Mother   . Anesthesia problems Neg Hx   . Malignant hyperthermia Neg Hx   . Pseudochol deficiency Neg Hx   .  Hypotension Neg Hx   . Migraines Neg Hx     Social History Social History   Tobacco Use  . Smoking status: Current Every Day Smoker    Packs/day: 1.00    Years: 15.00    Pack years: 15.00    Types: Cigarettes    Last attempt to quit: 01/02/2017    Years since quitting: 0.6  . Smokeless tobacco: Never Used  . Tobacco comment: smoked since 41 yrs old  Substance Use Topics  . Alcohol use: No    Comment: denied all drug/alcohol use  . Drug use: Yes    Types: Marijuana    Comment: denied all drug/alcohol use     Allergies    Penicillins; Bee venom; and Coconut oil   Review of Systems Review of Systems All other systems negative except as documented in the HPI. All pertinent positives and negatives as reviewed in the HPI.  Physical Exam Updated Vital Signs BP (!) 169/109 (BP Location: Left Arm)   Pulse 66   Temp 98.1 F (36.7 C) (Oral)   Resp 16   SpO2 92%   Physical Exam  Constitutional: She is oriented to person, place, and time. She appears well-developed and well-nourished. No distress.  HENT:  Head: Normocephalic and atraumatic.  Mouth/Throat: Oropharynx is clear and moist.  Eyes: Pupils are equal, round, and reactive to light.  Neck: Normal range of motion. Neck supple.  Cardiovascular: Normal rate, regular rhythm and normal heart sounds. Exam reveals no gallop and no friction rub.  No murmur heard. Pulmonary/Chest: Effort normal and breath sounds normal. No respiratory distress. She has no wheezes.  Neurological: She is alert and oriented to person, place, and time. She exhibits normal muscle tone. Coordination normal.  Skin: Skin is warm and dry. Capillary refill takes less than 2 seconds. No rash noted. No erythema.  Psychiatric: Her speech is normal and behavior is normal. Judgment normal. She is not agitated, not aggressive, not hyperactive, not slowed, not withdrawn and not combative. Thought content is not paranoid and not delusional. Cognition and memory are normal. She exhibits a depressed mood. She expresses suicidal ideation. She expresses no homicidal ideation. She expresses suicidal plans. She expresses no homicidal plans.  Nursing note and vitals reviewed.    ED Treatments / Results  Labs (all labs ordered are listed, but only abnormal results are displayed) Labs Reviewed  COMPREHENSIVE METABOLIC PANEL  ETHANOL  SALICYLATE LEVEL  ACETAMINOPHEN LEVEL  CBC  RAPID URINE DRUG SCREEN, HOSP PERFORMED  I-STAT BETA HCG BLOOD, ED ([facility], [facility], [facility] ONLY)    EKG  EKG  Interpretation None       Radiology No results found.  Procedures Procedures (including critical care time)  Medications Ordered in ED Medications - No data to display   Initial Impression / Assessment and Plan / ED Course  I have reviewed the triage vital signs and the nursing notes.  Pertinent labs & imaging results that were available during my care of the patient were reviewed by me and considered in my medical decision making (see chart for details).    Patient will need a TTS  consult for her hallucinations and suicidal ideation.  Patient is made aware of the plan and all questions were answered  Final Clinical Impressions(s) / ED Diagnoses   Final diagnoses:  None    ED Discharge Orders    None       Dalia Heading, PA-C 09/01/17 [place]    Virgel Manifold, MD 09/02/17 1111

## 2017-09-01 NOTE — ED Notes (Signed)
Pt denies feeling suicidal now. Her affect is appropriate, she smiles easily and her mood and behavior are appropriate.

## 2017-09-01 NOTE — BH Assessment (Addendum)
[place] Assessment Progress Note  Case was staffed with Romilda Garret FNP who recommended patient be monitored for safety and evaluated for medication management. Patient will be seen by psychiatry in the a.m.

## 2017-09-01 NOTE — Progress Notes (Signed)
09/01/17 1400:  Pt was sleep.   Victorino Sparrow, LRT/CTRS

## 2017-09-01 NOTE — ED Notes (Signed)
Bed: [facility] Expected date:  Expected time:  Means of arrival:  Comments: 

## 2017-09-01 NOTE — ED Triage Notes (Signed)
Pt reports she woke up seeing visual and auditory hallucinations this am. Pt on abilify injection and is close to when she is scheduled for her next injection. Pt also says she suicidal. No HI.

## 2017-09-02 DIAGNOSIS — F251 Schizoaffective disorder, depressive type: Secondary | ICD-10-CM | POA: Diagnosis not present

## 2017-09-02 DIAGNOSIS — Z818 Family history of other mental and behavioral disorders: Secondary | ICD-10-CM

## 2017-09-02 DIAGNOSIS — R4587 Impulsiveness: Secondary | ICD-10-CM

## 2017-09-02 DIAGNOSIS — F1210 Cannabis abuse, uncomplicated: Secondary | ICD-10-CM | POA: Diagnosis not present

## 2017-09-02 DIAGNOSIS — F17210 Nicotine dependence, cigarettes, uncomplicated: Secondary | ICD-10-CM | POA: Diagnosis not present

## 2017-09-02 MED ORDER — SERTRALINE HCL 50 MG PO TABS: 50.0000 mg | ORAL_TABLET | Freq: Every day | ORAL | 0 refills | Status: DC

## 2017-09-02 MED ORDER — ARIPIPRAZOLE 10 MG PO TABS: 10.0000 mg | ORAL_TABLET | Freq: Every day | ORAL | 0 refills | Status: DC

## 2017-09-02 NOTE — BHH Suicide Risk Assessment (Signed)
Suicide Risk Assessment  Discharge Assessment   [facility] Discharge Suicide Risk Assessment   Principal Problem: Schizoaffective disorder, depressive type [facility]) Discharge Diagnoses:  Patient Active Problem List   Diagnosis Date Noted  . Schizoaffective disorder ([place]) [F25.9] 07/29/2017  . MDD (major depressive disorder), recurrent episode, severe ([place]) [F33.2] 02/19/2017  . Schizoaffective disorder, bipolar type ([place]) [F25.0] 01/06/2017  . 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 IIH (idiopathic intracranial hypertension) [G93.2] 01/24/2016  . Cannabis use disorder, moderate, dependence ([place]) [F12.20] 08/18/2015  . Schizoaffective disorder, depressive type ([place]) [F25.1] 01/15/2012    Class: Acute    Total Time spent with patient: 45 minutes  Musculoskeletal: Strength & Muscle Tone: within normal limits Gait & Station: normal Patient leans: N/A  Psychiatric Specialty Exam: Physical Exam  Constitutional: She is oriented to person, place, and time. She appears well-developed and well-nourished.  Respiratory: Effort normal.  Musculoskeletal: Normal range of motion.  Neurological: She is alert and oriented to person, place, and time.  Psychiatric: Her speech is normal and behavior is normal. Thought content normal. Cognition and memory are normal. She expresses impulsivity. She exhibits a depressed mood.   Review of Systems  Psychiatric/Behavioral: Positive for depression and substance abuse. Negative for hallucinations, memory loss and suicidal ideas. The patient is not nervous/anxious and does not have insomnia.   All other systems reviewed and are negative.  Blood pressure (!) 142/81, pulse 64, temperature 98.3 F (36.8 C), temperature source Oral, resp. rate 16, SpO2 98 %.There is no height or weight on file to calculate BMI. General Appearance: Casual Eye Contact:  Good Speech:  Clear and Coherent and Normal Rate Volume:  Normal Mood:  Euthymic Affect:   Congruent Thought Process:  Coherent, Goal Directed and Linear Orientation:  Full (Time, Place, and Person) Thought Content:  Logical Suicidal Thoughts:  No Homicidal Thoughts:  No Memory:  Immediate;   Good Recent;   Good Remote;   Fair Judgement:  Fair Insight:  Fair Psychomotor Activity:  Normal Concentration:  Concentration: Good and Attention Span: Good Recall:  Good Fund of Knowledge:  Good Language:  Good Akathisia:  No Handed:  Right AIMS (if indicated):    Assets:  Agricultural consultant Housing Physical Health ADL's:  Intact Cognition:  WNL   Mental Status Per Nursing Assessment::   On Admission:   auditory and visual hallucinations with suicidal ideation  Demographic Factors:  Low socioeconomic status and Unemployed  Loss Factors: Financial problems/change in socioeconomic status  Historical Factors: Impulsivity  Risk Reduction Factors:   Sense of responsibility to family and Living with another person, especially a relative  Continued Clinical Symptoms:  Bipolar Disorder:   Depressive phase Depression:   Impulsivity More than one psychiatric diagnosis Previous Psychiatric Diagnoses and Treatments  Cognitive Features That Contribute To Risk:  Closed-mindedness    Suicide Risk:  Minimal: No identifiable suicidal ideation.  Patients presenting with no risk factors but with morbid ruminations; may be classified as minimal risk based on the severity of the depressive symptoms    Plan Of Care/Follow-up recommendations:  Activity:  as tolerated Diet:  Heart Healthy  Ethelene Hal, NP 09/02/2017, 1:10 PM

## 2017-09-02 NOTE — ED Notes (Signed)
Pt discharged safely after reviewing discharge instructions.  Pt was in no distress.  All belongings were returned to patient.  Bus pass was given to patient.

## 2017-09-02 NOTE — Discharge Instructions (Signed)
For your behavioral health needs, you are advised to follow up with the [facility] ACT Team:       [facility]      [address]      [place], [place] [address]      [phone]

## 2017-09-02 NOTE — BH Assessment (Signed)
[place] Assessment Progress Note  Per Corena Pilgrim, MD, this pt does not require psychiatric hospitalization at this time.  Pt is to be discharged from [facility], but given pt's frequent ED visits, she would benefit from ACT Team services.  At 09:22 this Probation officer called the [facility] and spoke to [place], pt's assigned care coordinator.  She reports that following pt's recent admission to [facility], arrangements had been made for her to follow up with [facility] for ACT Team services, for which pt is eligible.  However, pt failed to follow through.  This Probation officer then called several ACT Team providers, starting with [employer].  Having been unable to reach them, at 09:40 I called [facility] and spoke to [place].  She agrees to present at [facility] to speak with pt in person around 14:00.  At 14:30 Colletta Maryland arrives, and after speaking with pt, agrees to enroll her in their ACT Team.  Discharge instructions advise pt to follow through with this plan, and include [facility]' contact information.  I then called back to Delilah and informed her of outcome.  Pt's nurse, Nena Jordan, has been notified.  Jalene Mullet, [place] Triage Specialist [phone]

## 2017-09-02 NOTE — Consult Note (Signed)
[facility] Face-to-Face Psychiatry Consult   Reason for Consult:  Passive suicidal ideation Referring Physician:  EDP Patient Identification: Sarah [name] MRN:  [id number] Principal Diagnosis: Schizoaffective disorder, depressive type ([place]) Diagnosis:   Patient Active Problem List   Diagnosis Date Noted  . Schizoaffective disorder ([place]) [F25.9] 07/29/2017  . MDD (major depressive disorder), recurrent episode, severe ([place]) [F33.2] 02/19/2017  . Schizoaffective disorder, bipolar type ([place]) [F25.0] 01/06/2017  . 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 IIH (idiopathic intracranial hypertension) [G93.2] 01/24/2016  . Cannabis use disorder, moderate, dependence ([place]) [F12.20] 08/18/2015  . Schizoaffective disorder, depressive type ([place]) [F25.1] 01/15/2012    Class: Acute    Total Time spent with patient: 45 minutes  Subjective:   Sarah [name] is a 41 y.o. female patient admitted with thoughts of self harm without a plan.  HPI:  Pt was seen and chart reviewed with treatment team and Dr Darleene Cleaver. Pt stated she was hearing voices and seeing things and was suicidal because of this. Pt is followed by [facility] for her monthly injectable medication but stated she missed her shot this month because she couldn't afford it. Pt has been consistent with ED visits for several months around the time her shot is due and receives the shot while in the ED. Today,  Pt denies suicidal/homicidal ideation, denies auditory/visual hallucinations and does not appear to be responding to internal stimuli. Pt received her shot of Aripiprazole ER SRER 400 mg IM q 28 days on 09/01/2017. Next shot is due on 09/29/2017. Pt will be seen by Colletta Maryland with [facility] ACT team in the [facility] prior to discharge today. Pt will benefit from having an ACT team for her monthly injectable and this will reduce her frequent ED visits. Pt is stable and psychiatrically clear for discharge.   Past Psychiatric History: As  above  Risk to Self: None Risk to Others: None Prior Inpatient Therapy: Prior Inpatient Therapy: Yes Prior Therapy Dates: 2018 Prior Therapy Facilty/Provider(s): [facility] Reason for Treatment: MH issues Prior Outpatient Therapy: Prior Outpatient Therapy: Yes Prior Therapy Dates: Ongoing Prior Therapy Facilty/Provider(s): [facility] Reason for Treatment: Med mang Does patient have an ACCT team?: No Does patient have Intensive In-House Services?  : No Does patient have [facility] services? : Yes Does patient have P4CC services?: No  Past Medical History:  Past Medical History:  Diagnosis Date  . Anemia   . Anxiety   . Depression   . Elevated bilirubin    slight elevation at 1.3  . Hypertension   . Hypokalemia   . Intracranial hypertension   . Migraines   . Mild intellectual disability   . Schizoaffective disorder [facility])     Past Surgical History:  Procedure Laterality Date  . NO PAST SURGERIES     Family History:  Family History  Problem Relation Age of Onset  . Schizophrenia Mother   . Hypertension Mother   . Anesthesia problems Neg Hx   . Malignant hyperthermia Neg Hx   . Pseudochol deficiency Neg Hx   . Hypotension Neg Hx   . Migraines Neg Hx    Family Psychiatric  History: Unknown Social History:  Social History   Substance and Sexual Activity  Alcohol Use No   Comment: denied all drug/alcohol use     Social History   Substance and Sexual Activity  Drug Use Yes  . Types: Marijuana   Comment: denied all drug/alcohol use    Social History   Socioeconomic History  . Marital status: Single  Spouse name: None  . Number of children: 0  . Years of education: 39  . Highest education level: None  Social Needs  . Financial resource strain: None  . Food insecurity - worry: None  . Food insecurity - inability: None  . Transportation needs - medical: None  . Transportation needs - non-medical: None  Occupational History  . Occupation: Unemployed  Tobacco Use   . Smoking status: Current Every Day Smoker    Packs/day: 1.00    Years: 15.00    Pack years: 15.00    Types: Cigarettes    Last attempt to quit: 01/02/2017    Years since quitting: 0.6  . Smokeless tobacco: Never Used  . Tobacco comment: smoked since 41 yrs old  Substance and Sexual Activity  . Alcohol use: No    Comment: denied all drug/alcohol use  . Drug use: Yes    Types: Marijuana    Comment: denied all drug/alcohol use  . Sexual activity: Yes    Birth control/protection: None  Other Topics Concern  . None  Social History Narrative   Lives with fiance, Erlene Quan   Caffeine use: Soda: 1 soda daily   No coffee   No tea   Additional Social History:    Allergies:   Allergies  Allergen Reactions  . Penicillins Anaphylaxis    Has patient had a PCN reaction causing immediate rash, facial/tongue/throat swelling, SOB or lightheadedness with hypotension: throat and face swelling. YES Has patient had a PCN reaction causing severe rash involving mucus membranes or skin necrosis: Yes Has patient had a PCN reaction that required hospitalization Yes Has patient had a PCN reaction occurring within the last 10 years: No If all of the above answers are "NO", then may proceed with Cephalosporin use.   . Bee Venom Swelling  . Coconut Oil Hives and Swelling    ALLERGIES TO COCONUT    Labs:  Results for orders placed or performed during the hospital encounter of 09/01/17 (from the past 48 hour(s))  Comprehensive metabolic panel     Status: None   Collection Time: 09/01/17 11:09 AM  Result Value Ref Range   Sodium 138 135 - 145 mmol/L   Potassium 3.7 3.5 - 5.1 mmol/L   Chloride 106 101 - 111 mmol/L   CO2 27 22 - 32 mmol/L   Glucose, Bld 89 65 - 99 mg/dL   BUN 16 6 - 20 mg/dL   Creatinine, Ser 0.74 0.44 - 1.00 mg/dL   Calcium 8.9 8.9 - 10.3 mg/dL   Total Protein 7.5 6.5 - 8.1 g/dL   Albumin 4.0 3.5 - 5.0 g/dL   AST 22 15 - 41 U/L   ALT 14 14 - 54 U/L   Alkaline Phosphatase 79  38 - 126 U/L   Total Bilirubin 1.2 0.3 - 1.2 mg/dL   GFR calc non Af Amer >60 >60 mL/min   GFR calc Af Amer >60 >60 mL/min    Comment: (NOTE) The eGFR has been calculated using the CKD EPI equation. This calculation has not been validated in all clinical situations. eGFR's persistently <60 mL/min signify possible Chronic Kidney Disease.    Anion gap 5 5 - 15  cbc     Status: Abnormal   Collection Time: 09/01/17 11:09 AM  Result Value Ref Range   WBC 5.5 4.0 - 10.5 K/uL   RBC 4.22 3.87 - 5.11 MIL/uL   Hemoglobin 11.4 (L) 12.0 - 15.0 g/dL   HCT 35.7 (L) 36.0 - 46.0 %  MCV 84.6 78.0 - 100.0 fL   MCH 27.0 26.0 - 34.0 pg   MCHC 31.9 30.0 - 36.0 g/dL   RDW 17.2 (H) 11.5 - 15.5 %   Platelets 419 (H) 150 - 400 K/uL  Ethanol     Status: None   Collection Time: 09/01/17 11:10 AM  Result Value Ref Range   Alcohol, Ethyl (B) <10 <10 mg/dL    Comment:        LOWEST DETECTABLE LIMIT FOR SERUM ALCOHOL IS 10 mg/dL FOR MEDICAL PURPOSES ONLY   Salicylate level     Status: None   Collection Time: 09/01/17 11:10 AM  Result Value Ref Range   Salicylate Lvl <4.1 2.8 - 30.0 mg/dL  Acetaminophen level     Status: Abnormal   Collection Time: 09/01/17 11:10 AM  Result Value Ref Range   Acetaminophen (Tylenol), Serum <10 (L) 10 - 30 ug/mL    Comment:        THERAPEUTIC CONCENTRATIONS VARY SIGNIFICANTLY. A RANGE OF 10-30 ug/mL MAY BE AN EFFECTIVE CONCENTRATION FOR MANY PATIENTS. HOWEVER, SOME ARE BEST TREATED AT CONCENTRATIONS OUTSIDE THIS RANGE. ACETAMINOPHEN CONCENTRATIONS >150 ug/mL AT 4 HOURS AFTER INGESTION AND >50 ug/mL AT 12 HOURS AFTER INGESTION ARE OFTEN ASSOCIATED WITH TOXIC REACTIONS.   I-Stat beta hCG blood, ED     Status: None   Collection Time: 09/01/17 11:21 AM  Result Value Ref Range   I-stat hCG, quantitative <5.0 <5 mIU/mL   Comment 3            Comment:   GEST. AGE      CONC.  (mIU/mL)   <=1 WEEK        5 - 50     2 WEEKS       50 - 500     3 WEEKS       100 -  10,000     4 WEEKS     1,000 - 30,000        FEMALE AND NON-PREGNANT FEMALE:     LESS THAN 5 mIU/mL     Current Facility-Administered Medications  Medication Dose Route Frequency Provider Last Rate Last Dose  . alum & mag hydroxide-simeth (MAALOX/MYLANTA) 200-200-20 MG/5ML suspension 30 mL  30 mL Oral Q6H PRN Lawyer, Christopher, PA-C      . amLODipine (NORVASC) tablet 10 mg  10 mg Oral Daily Ethelene Hal, NP   10 mg at 09/02/17 1009  . ARIPiprazole (ABILIFY) tablet 10 mg  10 mg Oral Daily Ethelene Hal, NP   10 mg at 09/02/17 1009  . ARIPiprazole ER SRER 400 mg  400 mg Intramuscular Q28 days Ethelene Hal, NP   400 mg at 09/01/17 1459  . hydrochlorothiazide (HYDRODIURIL) tablet 25 mg  25 mg Oral Daily Ethelene Hal, NP   25 mg at 09/02/17 1009  . hydrOXYzine (ATARAX/VISTARIL) tablet 25 mg  25 mg Oral TID PRN Ethelene Hal, NP   25 mg at 09/01/17 2111  . ibuprofen (ADVIL,MOTRIN) tablet 600 mg  600 mg Oral Q8H PRN Lawyer, Christopher, PA-C      . ondansetron [facility]) tablet 4 mg  4 mg Oral Q8H PRN Lawyer, Christopher, PA-C      . sertraline (ZOLOFT) tablet 50 mg  50 mg Oral Daily Ethelene Hal, NP   50 mg at 09/02/17 1009  . traZODone (DESYREL) tablet 50 mg  50 mg Oral QHS Ethelene Hal, NP   50 mg at 09/01/17 2111  Current Outpatient Medications  Medication Sig Dispense Refill  . amLODipine (NORVASC) 10 MG tablet Take 1 tablet (10 mg total) by mouth daily. For high blood pressure 10 tablet 0  . ARIPiprazole ER 400 MG SRER Inject 400 mg into the muscle every 28 (twenty-eight) days. (Due on 08-29-17): For mood control 1 each 0  . hydrochlorothiazide (HYDRODIURIL) 25 MG tablet Take 1 tablet (25 mg total) by mouth daily. For high blood pressure 10 tablet 0  . hydrOXYzine (ATARAX/VISTARIL) 25 MG tablet Take 1 tablet (25 mg total) by mouth every 6 (six) hours as needed for anxiety. 75 tablet 0  . lamoTRIgine (LAMICTAL) 25 MG tablet Take 1  tablet (25 mg total) by mouth 2 (two) times daily. For mood stabilization 60 tablet 0  . ARIPiprazole (ABILIFY) 10 MG tablet Take 1 tablet (10 mg total) by mouth daily. For mood control (Patient not taking: Reported on 09/01/2017) 30 tablet 0  . sertraline (ZOLOFT) 100 MG tablet Take 1 tablet (100 mg total) by mouth at bedtime. For depression (Patient not taking: Reported on 09/01/2017) 30 tablet 0  . traZODone (DESYREL) 150 MG tablet Take 1 tablet (150 mg total) by mouth at bedtime. For sleep (Patient not taking: Reported on 09/01/2017) 30 tablet 0    Musculoskeletal: Strength & Muscle Tone: within normal limits Gait & Station: normal Patient leans: N/A  Psychiatric Specialty Exam: Physical Exam  Constitutional: She is oriented to person, place, and time. She appears well-developed and well-nourished.  Respiratory: Effort normal.  Musculoskeletal: Normal range of motion.  Neurological: She is alert and oriented to person, place, and time.  Psychiatric: Her speech is normal and behavior is normal. Thought content normal. Cognition and memory are normal. She expresses impulsivity. She exhibits a depressed mood.    Review of Systems  Psychiatric/Behavioral: Positive for depression and substance abuse. Negative for hallucinations, memory loss and suicidal ideas. The patient is not nervous/anxious and does not have insomnia.   All other systems reviewed and are negative.   Blood pressure (!) 142/81, pulse 64, temperature 98.3 F (36.8 C), temperature source Oral, resp. rate 16, SpO2 98 %.There is no height or weight on file to calculate BMI.  General Appearance: Casual  Eye Contact:  Good  Speech:  Clear and Coherent and Normal Rate  Volume:  Normal  Mood:  Euthymic  Affect:  Congruent  Thought Process:  Coherent, Goal Directed and Linear  Orientation:  Full (Time, Place, and Person)  Thought Content:  Logical  Suicidal Thoughts:  No  Homicidal Thoughts:  No  Memory:  Immediate;    Good Recent;   Good Remote;   Fair  Judgement:  Fair  Insight:  Fair  Psychomotor Activity:  Normal  Concentration:  Concentration: Good and Attention Span: Good  Recall:  Good  Fund of Knowledge:  Good  Language:  Good  Akathisia:  No  Handed:  Right  AIMS (if indicated):     Assets:  Agricultural consultant Housing Physical Health  ADL's:  Intact  Cognition:  WNL  Sleep:        Treatment Plan Summary: Plan Schizoaffective disorder, depressive type ([place])  Discharge Home Follow up with [facility] for your monthly injections and medication management Take all medications as prescribed Avoid the use of alcohol and illicit drugs  Disposition: No evidence of imminent risk to self or others at present.   Patient does not meet criteria for psychiatric inpatient admission. Supportive therapy provided about ongoing stressors. Discussed  crisis plan, support from social network, calling 911, coming to the Emergency Department, and calling Suicide Hotline.  Ethelene Hal, NP 09/02/2017 12:51 PM  Patient seen face-to-face for psychiatric evaluation, chart reviewed and case discussed with the physician extender and developed treatment plan. Reviewed the information documented and agree with the treatment plan. Corena Pilgrim, MD

## 2017-09-02 NOTE — Progress Notes (Signed)
09/02/17 1405:  LRT went to pt room to offer activities.  Pt was lying down and declined.    Victorino Sparrow, LRT/CTRS

## 2017-09-04 NOTE — Progress Notes (Addendum)
Please contact Colletta Maryland with [facility] if patient arrives to ED [phone]. [facility] has been having difficultly contacting patient.   Kingsley Spittle, [facility] Emergency Room Clinical Social Worker [phone]

## 2017-10-20 ENCOUNTER — Emergency Department (HOSPITAL_COMMUNITY)
Admission: EM | Admit: 2017-10-20 | Discharge: 2017-10-21 | Disposition: A | Discharge status: Other Acute Inpatient Hospital | Payer: Medicaid Other | Attending: Emergency Medicine | Admitting: Emergency Medicine

## 2017-10-20 ENCOUNTER — Encounter (HOSPITAL_COMMUNITY): Payer: Self-pay | Admitting: Emergency Medicine

## 2017-10-20 ENCOUNTER — Other Ambulatory Visit: Payer: Self-pay

## 2017-10-20 DIAGNOSIS — Y929 Unspecified place or not applicable: Secondary | ICD-10-CM | POA: Insufficient documentation

## 2017-10-20 DIAGNOSIS — Y939 Activity, unspecified: Secondary | ICD-10-CM | POA: Insufficient documentation

## 2017-10-20 DIAGNOSIS — Z79899 Other long term (current) drug therapy: Secondary | ICD-10-CM | POA: Insufficient documentation

## 2017-10-20 DIAGNOSIS — R440 Auditory hallucinations: Secondary | ICD-10-CM | POA: Insufficient documentation

## 2017-10-20 DIAGNOSIS — X838XXA Intentional self-harm by other specified means, initial encounter: Secondary | ICD-10-CM | POA: Insufficient documentation

## 2017-10-20 DIAGNOSIS — F259 Schizoaffective disorder, unspecified: Secondary | ICD-10-CM | POA: Diagnosis not present

## 2017-10-20 DIAGNOSIS — F32A Depression, unspecified: Secondary | ICD-10-CM

## 2017-10-20 DIAGNOSIS — F17200 Nicotine dependence, unspecified, uncomplicated: Secondary | ICD-10-CM | POA: Insufficient documentation

## 2017-10-20 DIAGNOSIS — R45851 Suicidal ideations: Secondary | ICD-10-CM | POA: Insufficient documentation

## 2017-10-20 DIAGNOSIS — F329 Major depressive disorder, single episode, unspecified: Secondary | ICD-10-CM | POA: Diagnosis present

## 2017-10-20 DIAGNOSIS — Z880 Allergy status to penicillin: Secondary | ICD-10-CM | POA: Diagnosis not present

## 2017-10-20 DIAGNOSIS — Y998 Other external cause status: Secondary | ICD-10-CM | POA: Insufficient documentation

## 2017-10-20 DIAGNOSIS — F70 Mild intellectual disabilities: Secondary | ICD-10-CM | POA: Diagnosis not present

## 2017-10-20 DIAGNOSIS — T1491XA Suicide attempt, initial encounter: Secondary | ICD-10-CM | POA: Diagnosis not present

## 2017-10-20 DIAGNOSIS — Z91030 Bee allergy status: Secondary | ICD-10-CM | POA: Insufficient documentation

## 2017-10-20 LAB — COMPREHENSIVE METABOLIC PANEL
ALT: 11 U/L — ABNORMAL LOW (ref 14–54)
AST: 16 U/L (ref 15–41)
Albumin: 3.7 g/dL (ref 3.5–5.0)
Alkaline Phosphatase: 77 U/L (ref 38–126)
Anion gap: 3 — ABNORMAL LOW (ref 5–15)
BUN: 18 mg/dL (ref 6–20)
CO2: 23 mmol/L (ref 22–32)
Calcium: 8.4 mg/dL — ABNORMAL LOW (ref 8.9–10.3)
Chloride: 113 mmol/L — ABNORMAL HIGH (ref 101–111)
Creatinine, Ser: 0.62 mg/dL (ref 0.44–1.00)
GFR calc Af Amer: >60 mL/min (ref >60)
GFR calc non Af Amer: >60 mL/min (ref >60)
Glucose, Bld: 97 mg/dL (ref 65–99)
Potassium: 3.7 mmol/L (ref 3.5–5.1)
Sodium: 139 mmol/L (ref 135–145)
Total Bilirubin: 1.3 mg/dL — ABNORMAL HIGH (ref 0.3–1.2)
Total Protein: 7.4 g/dL (ref 6.5–8.1)

## 2017-10-20 LAB — CBC
HCT: 33.9 % — ABNORMAL LOW (ref 36.0–46.0)
Hemoglobin: 10.9 g/dL — ABNORMAL LOW (ref 12.0–15.0)
MCH: 27.1 pg (ref 26.0–34.0)
MCHC: 32.2 g/dL (ref 30.0–36.0)
MCV: 84.3 fL (ref 78.0–100.0)
Platelets: 474 10*3/uL — ABNORMAL HIGH (ref 150–400)
RBC: 4.02 MIL/uL (ref 3.87–5.11)
RDW: 16.7 % — ABNORMAL HIGH (ref 11.5–15.5)
WBC: 6.4 10*3/uL (ref 4.0–10.5)

## 2017-10-20 LAB — RAPID URINE DRUG SCREEN, HOSP PERFORMED
Amphetamines: NOT DETECTED
Barbiturates: NOT DETECTED
Benzodiazepines: NOT DETECTED
Cocaine: NOT DETECTED
Opiates: NOT DETECTED
Tetrahydrocannabinol: NOT DETECTED

## 2017-10-20 LAB — I-STAT BETA HCG BLOOD, ED (MC, WL, AP ONLY): I-stat hCG, quantitative: <5 m[IU]/mL (ref <5)

## 2017-10-20 LAB — ACETAMINOPHEN LEVEL: Acetaminophen (Tylenol), Serum: <10 ug/mL — ABNORMAL LOW (ref 10–30)

## 2017-10-20 LAB — SALICYLATE LEVEL: Salicylate Lvl: <7 mg/dL (ref 2.8–30.0)

## 2017-10-20 LAB — ETHANOL: Alcohol, Ethyl (B): <10 mg/dL (ref <10)

## 2017-10-20 MED ORDER — ARIPIPRAZOLE ER 400 MG IM SRER
400.0000 mg | INTRAMUSCULAR | Status: DC
  Administered 2017-10-21: 400 mg via INTRAMUSCULAR
  Filled 2017-10-20 (×2): qty 400

## 2017-10-20 MED ORDER — TRAZODONE HCL 50 MG PO TABS
150.0000 mg | ORAL_TABLET | Freq: Every evening | ORAL | Status: DC | PRN
  Administered 2017-10-20: 22:00:00 150 mg via ORAL
  Filled 2017-10-20: qty 1

## 2017-10-20 MED ORDER — HYDROCHLOROTHIAZIDE 25 MG PO TABS
25.0000 mg | ORAL_TABLET | Freq: Every day | ORAL | Status: DC
Start: 2017-10-20 — End: 2017-10-21
  Administered 2017-10-20 – 2017-10-21 (×2): 25 mg via ORAL
  Filled 2017-10-20 (×2): qty 1

## 2017-10-20 MED ORDER — HYDROXYZINE HCL 25 MG PO TABS
25.0000 mg | ORAL_TABLET | Freq: Four times a day (QID) | ORAL | Status: DC | PRN
  Administered 2017-10-20: 25 mg via ORAL
  Filled 2017-10-20: qty 1

## 2017-10-20 MED ORDER — AMLODIPINE BESYLATE 5 MG PO TABS
10.0000 mg | ORAL_TABLET | Freq: Every day | ORAL | Status: DC
  Administered 2017-10-20 – 2017-10-21 (×2): 10 mg via ORAL
  Filled 2017-10-20 (×2): qty 2

## 2017-10-20 MED ORDER — SERTRALINE HCL 50 MG PO TABS
50.0000 mg | ORAL_TABLET | Freq: Every day | ORAL | Status: DC
  Administered 2017-10-20 – 2017-10-21 (×2): 50 mg via ORAL
  Filled 2017-10-20 (×2): qty 1

## 2017-10-20 NOTE — ED Triage Notes (Signed)
Pt c/o feelings of suicide and hallucinations. Pt states she has not had change in meds or missed meds. Pt states she attempted suicide today by hanging self with a belt.

## 2017-10-20 NOTE — ED Notes (Signed)
Bed: [facility] Expected date:  Expected time:  Means of arrival:  Comments: 

## 2017-10-20 NOTE — ED Notes (Addendum)
Pt has been rewanded by [employer].   Pt and belongings taken to Sec[facility] area.

## 2017-10-20 NOTE — ED Provider Notes (Signed)
[facility] DEPT Provider Note   CSN: [id number] Arrival date & time: 10/20/17  1021     History   Chief Complaint Chief Complaint  Patient presents with  . Suicidal  . Hallucinations    HPI Sarah [name] is a 42 y.o. female.  HPI   42 year old female with suicidal ideation.  She states that prior to arrival she tried to hang herself with a belt.  She cannot tell me specifically what precipitated her wanting to do this.  She reports no acute stressors.  She reports compliance with her medications.  Past Medical History:  Diagnosis Date  . Anemia   . Anxiety   . Depression   . Elevated bilirubin    slight elevation at 1.3  . Hypertension   . Hypokalemia   . Intracranial hypertension   . Migraines   . Mild intellectual disability   . Schizoaffective disorder [facility])     Patient Active Problem List   Diagnosis Date Noted  . Schizoaffective disorder ([place]) 07/29/2017  . MDD (major depressive disorder), recurrent episode, severe ([place]) 02/19/2017  . Schizoaffective disorder, bipolar type ([place]) 01/06/2017  . Mild intellectual disability 01/06/2017  . IIH (idiopathic intracranial hypertension) 01/24/2016  . Cannabis use disorder, moderate, dependence ([place]) 08/18/2015  . Schizoaffective disorder, depressive type ([place]) 01/15/2012    Class: Acute    Past Surgical History:  Procedure Laterality Date  . NO PAST SURGERIES      OB History    No data available       Home Medications    Prior to Admission medications   Medication Sig Start Date End Date Taking? Authorizing Provider  amLODipine (NORVASC) 10 MG tablet Take 1 tablet (10 mg total) by mouth daily. For high blood pressure 08/01/17   N[place]oko, Herbert Pun I, NP  ARIPiprazole (ABILIFY) 10 MG tablet Take 1 tablet (10 mg total) by mouth daily. 09/03/17   Ethelene Hal, NP  ARIPiprazole ER 400 MG SRER Inject 400 mg into the muscle every 28 (twenty-eight) days. (Due on 08-29-17):  For mood control 08/29/17   Lindell Spar I, NP  hydrochlorothiazide (HYDRODIURIL) 25 MG tablet Take 1 tablet (25 mg total) by mouth daily. For high blood pressure 08/02/17   Lindell Spar I, NP  hydrOXYzine (ATARAX/VISTARIL) 25 MG tablet Take 1 tablet (25 mg total) by mouth every 6 (six) hours as needed for anxiety. 08/01/17   Lindell Spar I, NP  sertraline (ZOLOFT) 50 MG tablet Take 1 tablet (50 mg total) by mouth daily. 09/03/17   Ethelene Hal, NP  traZODone (DESYREL) 150 MG tablet Take 1 tablet (150 mg total) by mouth at bedtime. For sleep Patient not taking: Reported on 09/01/2017 08/01/17   Encarnacion Slates, NP    Family History Family History  Problem Relation Age of Onset  . Schizophrenia Mother   . Hypertension Mother   . Anesthesia problems Neg Hx   . Malignant hyperthermia Neg Hx   . Pseudochol deficiency Neg Hx   . Hypotension Neg Hx   . Migraines Neg Hx     Social History Social History   Tobacco Use  . Smoking status: Current Every Day Smoker    Packs/day: 1.00    Years: 15.00    Pack years: 15.00    Types: Cigarettes    Last attempt to quit: 01/02/2017    Years since quitting: 0.7  . Smokeless tobacco: Never Used  . Tobacco comment: smoked since 42 yrs old  Substance  Use Topics  . Alcohol use: No    Comment: denied all drug/alcohol use  . Drug use: Yes    Types: Marijuana    Comment: denied all drug/alcohol use     Allergies   Penicillins; Bee venom; and Coconut oil   Review of Systems Review of Systems  All systems reviewed and negative, other than as noted in HPI.  Physical Exam Updated Vital Signs BP 118/78 (BP Location: Left Arm)   Pulse 66   Temp 97.9 F (36.6 C) (Oral)   Resp 16   LMP 09/21/2017 (Exact Date)   SpO2 97%   Physical Exam  Constitutional: She appears well-developed and well-nourished. No distress.  HENT:  Head: Normocephalic and atraumatic.  Neck grossly normal in appearance.  I do not see any ligature marks or  otherwise any signs of trauma.  Neck is nontender.  No stridor.  Supple.  Eyes: Conjunctivae are normal. Right eye exhibits no discharge. Left eye exhibits no discharge.  Neck: Neck supple.  Cardiovascular: Normal rate, regular rhythm and normal heart sounds. Exam reveals no gallop and no friction rub.  No murmur heard. Pulmonary/Chest: Effort normal and breath sounds normal. No respiratory distress.  Abdominal: Soft. She exhibits no distension. There is no tenderness.  Musculoskeletal: She exhibits no edema or tenderness.  Neurological: She is alert.  Skin: Skin is warm and dry.  Psychiatric:  Flat affect.  Poor eye contact.  Nursing note and vitals reviewed.    ED Treatments / Results  Labs (all labs ordered are listed, but only abnormal results are displayed) Labs Reviewed  COMPREHENSIVE METABOLIC PANEL - Abnormal; Notable for the following components:      Result Value   Chloride 113 (*)    Calcium 8.4 (*)    ALT 11 (*)    Total Bilirubin 1.3 (*)    Anion gap 3 (*)    All other components within normal limits  ACETAMINOPHEN LEVEL - Abnormal; Notable for the following components:   Acetaminophen (Tylenol), Serum <10 (*)    All other components within normal limits  CBC - Abnormal; Notable for the following components:   Hemoglobin 10.9 (*)    HCT 33.9 (*)    RDW 16.7 (*)    Platelets 474 (*)    All other components within normal limits  ETHANOL  SALICYLATE LEVEL  RAPID URINE DRUG SCREEN, HOSP PERFORMED  I-STAT BETA HCG BLOOD, ED ([facility], [facility], [facility] ONLY)    EKG  EKG Interpretation None       Radiology No results found.  Procedures Procedures (including critical care time)  Medications Ordered in ED Medications - No data to display   Initial Impression / Assessment and Plan / ED Course  I have reviewed the triage vital signs and the nursing notes.  Pertinent labs & imaging results that were available during my care of the patient were reviewed by me and  considered in my medical decision making (see chart for details).     42 year old female with depression and suicidal ideation.  Will medically clear for psychiatric evaluation.  Final Clinical Impressions(s) / ED Diagnoses   Final diagnoses:  Depression, unspecified depression type  Suicidal thoughts    ED Discharge Orders    None       Virgel Manifold, MD 10/20/17 1348

## 2017-10-20 NOTE — ED Notes (Signed)
Pt oriented to room and unit.  Pt appears very sad.  Pt states she attempted to hang herself today.  Pt states she will contract for safety.  15 minute checks and video monitoring in place.

## 2017-10-20 NOTE — ED Notes (Signed)
Bed: [facility] Expected date:  Expected time:  Means of arrival:  Comments: Triage 4

## 2017-10-20 NOTE — ED Notes (Signed)
Patient has three bags of belongings in the cabinet in triage.

## 2017-10-21 ENCOUNTER — Inpatient Hospital Stay (HOSPITAL_COMMUNITY)
Admission: AD | Admit: 2017-10-21 | Discharge: 2017-10-25 | DRG: 885 | Disposition: A | Discharge status: Home / Self Care | Payer: Medicaid Other | Source: Intra-hospital | Attending: Psychiatry | Admitting: Psychiatry

## 2017-10-21 ENCOUNTER — Other Ambulatory Visit: Payer: Self-pay

## 2017-10-21 ENCOUNTER — Encounter (HOSPITAL_COMMUNITY): Payer: Self-pay

## 2017-10-21 DIAGNOSIS — D473 Essential (hemorrhagic) thrombocythemia: Secondary | ICD-10-CM | POA: Diagnosis not present

## 2017-10-21 DIAGNOSIS — F251 Schizoaffective disorder, depressive type: Principal | ICD-10-CM | POA: Diagnosis present

## 2017-10-21 DIAGNOSIS — F259 Schizoaffective disorder, unspecified: Secondary | ICD-10-CM | POA: Diagnosis present

## 2017-10-21 DIAGNOSIS — F332 Major depressive disorder, recurrent severe without psychotic features: Secondary | ICD-10-CM | POA: Diagnosis present

## 2017-10-21 DIAGNOSIS — R17 Unspecified jaundice: Secondary | ICD-10-CM | POA: Diagnosis present

## 2017-10-21 DIAGNOSIS — D649 Anemia, unspecified: Secondary | ICD-10-CM | POA: Diagnosis present

## 2017-10-21 DIAGNOSIS — F1220 Cannabis dependence, uncomplicated: Secondary | ICD-10-CM | POA: Diagnosis present

## 2017-10-21 DIAGNOSIS — Z8249 Family history of ischemic heart disease and other diseases of the circulatory system: Secondary | ICD-10-CM | POA: Diagnosis not present

## 2017-10-21 DIAGNOSIS — F419 Anxiety disorder, unspecified: Secondary | ICD-10-CM | POA: Diagnosis not present

## 2017-10-21 DIAGNOSIS — T1491XA Suicide attempt, initial encounter: Secondary | ICD-10-CM | POA: Diagnosis not present

## 2017-10-21 DIAGNOSIS — F70 Mild intellectual disabilities: Secondary | ICD-10-CM | POA: Diagnosis present

## 2017-10-21 DIAGNOSIS — Z23 Encounter for immunization: Secondary | ICD-10-CM | POA: Diagnosis not present

## 2017-10-21 DIAGNOSIS — G932 Benign intracranial hypertension: Secondary | ICD-10-CM | POA: Diagnosis not present

## 2017-10-21 DIAGNOSIS — I10 Essential (primary) hypertension: Secondary | ICD-10-CM | POA: Diagnosis present

## 2017-10-21 DIAGNOSIS — Z818 Family history of other mental and behavioral disorders: Secondary | ICD-10-CM

## 2017-10-21 DIAGNOSIS — F17210 Nicotine dependence, cigarettes, uncomplicated: Secondary | ICD-10-CM | POA: Diagnosis present

## 2017-10-21 DIAGNOSIS — F39 Unspecified mood [affective] disorder: Secondary | ICD-10-CM | POA: Diagnosis not present

## 2017-10-21 DIAGNOSIS — F79 Unspecified intellectual disabilities: Secondary | ICD-10-CM | POA: Diagnosis not present

## 2017-10-21 DIAGNOSIS — Z560 Unemployment, unspecified: Secondary | ICD-10-CM | POA: Diagnosis not present

## 2017-10-21 DIAGNOSIS — G4700 Insomnia, unspecified: Secondary | ICD-10-CM | POA: Diagnosis not present

## 2017-10-21 DIAGNOSIS — X838XXA Intentional self-harm by other specified means, initial encounter: Secondary | ICD-10-CM | POA: Diagnosis not present

## 2017-10-21 DIAGNOSIS — Z915 Personal history of self-harm: Secondary | ICD-10-CM | POA: Diagnosis not present

## 2017-10-21 MED ORDER — SERTRALINE HCL 50 MG PO TABS
50.0000 mg | ORAL_TABLET | Freq: Every day | ORAL | Status: DC
  Administered 2017-10-22: 50 mg via ORAL
  Filled 2017-10-21 (×3): qty 1

## 2017-10-21 MED ORDER — MAGNESIUM HYDROXIDE 400 MG/5ML PO SUSP: 30.0000 mL | Freq: Every day | ORAL | Status: DC | PRN

## 2017-10-21 MED ORDER — AMLODIPINE BESYLATE 10 MG PO TABS
10.0000 mg | ORAL_TABLET | Freq: Every day | ORAL | Status: DC
  Administered 2017-10-22 – 2017-10-25 (×4): 10 mg via ORAL
  Filled 2017-10-21 (×7): qty 1

## 2017-10-21 MED ORDER — ACETAMINOPHEN 325 MG PO TABS
650.0000 mg | ORAL_TABLET | Freq: Four times a day (QID) | ORAL | Status: DC | PRN
  Filled 2017-10-21: qty 2

## 2017-10-21 MED ORDER — HYDROXYZINE HCL 25 MG PO TABS: 25.0000 mg | ORAL_TABLET | Freq: Four times a day (QID) | ORAL | Status: DC | PRN

## 2017-10-21 MED ORDER — TRAZODONE HCL 150 MG PO TABS
150.0000 mg | ORAL_TABLET | Freq: Every evening | ORAL | Status: DC | PRN
  Administered 2017-10-22 – 2017-10-23 (×2): 150 mg via ORAL
  Filled 2017-10-21 (×3): qty 1

## 2017-10-21 MED ORDER — ARIPIPRAZOLE ER 400 MG IM SRER: 400.0000 mg | INTRAMUSCULAR | Status: DC

## 2017-10-21 MED ORDER — NICOTINE POLACRILEX 2 MG MT GUM: 2.0000 mg | CHEWING_GUM | OROMUCOSAL | Status: DC | PRN

## 2017-10-21 MED ORDER — HYDROCHLOROTHIAZIDE 25 MG PO TABS
25.0000 mg | ORAL_TABLET | Freq: Every day | ORAL | Status: DC
  Administered 2017-10-22 – 2017-10-25 (×4): 25 mg via ORAL
  Filled 2017-10-21 (×8): qty 1

## 2017-10-21 MED ORDER — ALUM & MAG HYDROXIDE-SIMETH 200-200-20 MG/5ML PO SUSP: 30.0000 mL | ORAL | Status: DC | PRN

## 2017-10-21 MED ORDER — PNEUMOCOCCAL VAC POLYVALENT 25 MCG/0.5ML IJ INJ
0.5000 mL | INJECTION | INTRAMUSCULAR | Status: AC
  Administered 2017-10-22: 0.5 mL via INTRAMUSCULAR

## 2017-10-21 NOTE — ED Provider Notes (Signed)
Notified by TTS that pt does not have consult in. Consult placed, awaiting reccs.   Duffy Bruce, MD 10/21/17 [id number]

## 2017-10-21 NOTE — BH Assessment (Signed)
[facility] Assessment Progress Note  Per Hampton Abbot, MD, this pt requires psychiatric hospitalization at this time.  Ronnie, RN, [facility] reports that [facility] does not have a bed available currently, but assures me that a bed will be available for the pt later today.  She will call when they are ready to receive pt.  She advises me to have pt sign consents.  Pt has signed Voluntary Admission and Consent for Treatment, as well as Consent to Release Information to no one, and signed forms have been faxed to [facility].  Pt's nurse, Caren Griffins, has been notified, and agrees to send original paperwork along with pt via [facility], and to call report to [phone] when the time comes.  Jalene Mullet, [place] Coordinator [phone]

## 2017-10-21 NOTE — ED Notes (Signed)
[facility] called for transport. 

## 2017-10-21 NOTE — ED Notes (Signed)
Report given to [facility] RN at behavioral health.  Patient to be transported to behavioral health by [facility] at [facility].

## 2017-10-21 NOTE — BH Assessment (Signed)
Assessment Note  Sarah [name] is an 42 y.o. female who came to [facility] after attempting to hang herself with a belt to kill herself. She states that her husband found her and called 911. Pt has a history of schizoaffective disorder and has not taken her medications in a month. She states that yesterday she was hearing voices telling her to kill herself. Pt has a history of suicide attempts in the past due to hearing voices and was admitted 3 times for the same last year. Pt was withdrawn and depressed during assessment. She denies hearing voices right now but is still feeling suicidal and can not contract for safety if she is released. Pt states that she needs to get back on her medication. Pt goes to [facility] for medication management and gets a shot of abilify each month. She need not receive this last month and will be receiving this medication in the ED today. Pt lives with her husband and husband's mom who are good supports for her. Per Jinny Blossom NP pt is to be observed overnight for safety and stability.    Diagnosis: Schizoaffective Disorder   Past Medical History:  Past Medical History:  Diagnosis Date  . Anemia   . Anxiety   . Depression   . Elevated bilirubin    slight elevation at 1.3  . Hypertension   . Hypokalemia   . Intracranial hypertension   . Migraines   . Mild intellectual disability   . Schizoaffective disorder [facility])     Past Surgical History:  Procedure Laterality Date  . NO PAST SURGERIES      Family History:  Family History  Problem Relation Age of Onset  . Schizophrenia Mother   . Hypertension Mother   . Anesthesia problems Neg Hx   . Malignant hyperthermia Neg Hx   . Pseudochol deficiency Neg Hx   . Hypotension Neg Hx   . Migraines Neg Hx     Social History:  reports that she has been smoking cigarettes.  She has a 15.00 pack-year smoking history. she has never used smokeless tobacco. She reports that she uses drugs. Drug: Marijuana. She reports  that she does not drink alcohol.  Additional Social History:  Alcohol / Drug Use Pain Medications: See MAR Prescriptions: See MAR Over the Counter: See MAR History of alcohol / drug use?: Yes Longest period of sobriety (when/how long): Unknown Substance #1 Name of Substance 1: Alcohol  1 - Age of First Use: Unknown 1 - Amount (size/oz): 3 to 4 12 oz beers 1 - Frequency: Three to four times a week 1 - Duration: Unknown 1 - Last Use / Amount: PER HISTORY   CIWA: CIWA-Ar BP: 136/71 Pulse Rate: (!) 58 COWS:    Allergies:  Allergies  Allergen Reactions  . Penicillins Anaphylaxis    Has patient had a PCN reaction causing immediate rash, facial/tongue/throat swelling, SOB or lightheadedness with hypotension: throat and face swelling. YES Has patient had a PCN reaction causing severe rash involving mucus membranes or skin necrosis: Yes Has patient had a PCN reaction that required hospitalization Yes Has patient had a PCN reaction occurring within the last 10 years: No If all of the above answers are "NO", then may proceed with Cephalosporin use.   . Bee Venom Swelling  . Coconut Oil Hives and Swelling    ALLERGIES TO COCONUT    Home Medications:  (Not in a hospital admission)  OB/GYN Status:  Patient's last menstrual period was 09/21/2017 (exact date).  General Assessment Data [facility] of Assessment: [facility] TTS Assessment: In system Is this a Tele or Face-to-Face Assessment?: Face-to-Face Is this an Initial Assessment or a Re-assessment for this encounter?: Initial Assessment Marital status: Married Is patient pregnant?: Unknown Pregnancy Status: Unknown Living Arrangements: Spouse/significant other, Other relatives Can pt return to current living arrangement?: Yes Admission Status: Voluntary Is patient capable of signing voluntary admission?: Yes Referral Source: Self/Family/Friend Insurance type: Medicaid     Crisis Care Plan Living Arrangements: Spouse/significant  other, Other relatives Legal Guardian: Other:(Self) Name of Psychiatrist: Warden/ranger Name of Therapist: [facility]  Education Status Is patient currently in school?: No Highest grade of school patient has completed: 12 Name of school: NA Contact person: NA  Risk to self with the past 6 months Suicidal Ideation: Yes-Currently Present Has patient been a risk to self within the past 6 months prior to admission? : Yes Suicidal Intent: No Has patient had any suicidal intent within the past 6 months prior to admission? : Yes Is patient at risk for suicide?: Yes Suicidal Plan?: No Has patient had any suicidal plan within the past 6 months prior to admission? : Yes Access to Means: No What has been your use of drugs/alcohol within the last 12 months?: Denies current use Previous Attempts/Gestures: Yes How many times?: 3 Other Self Harm Risks: NA Triggers for Past Attempts: Unknown Intentional Self Injurious Behavior: None Family Suicide History: No Recent stressful life event(s): Other (Comment) Persecutory voices/beliefs?: No Depression: No Depression Symptoms: Despondent, Loss of interest in usual pleasures, Feeling worthless/self pity Substance abuse history and/or treatment for substance abuse?: No Suicide prevention information given to non-admitted patients: Not applicable  Risk to Others within the past 6 months Homicidal Ideation: No Does patient have any lifetime risk of violence toward others beyond the six months prior to admission? : No Thoughts of Harm to Others: No Current Homicidal Intent: No Current Homicidal Plan: No Access to Homicidal Means: No Identified Victim: NA History of harm to others?: No Assessment of Violence: None Noted Violent Behavior Description: NA Does patient have access to weapons?: No Criminal Charges Pending?: No Does patient have a court date: No Is patient on probation?: No  Psychosis Hallucinations: Auditory, Visual Delusions: None  noted  Mental Status Report Appearance/Hygiene: In scrubs Eye Contact: Fair Motor Activity: Freedom of movement Speech: Slow, Soft Level of Consciousness: Quiet/awake Mood: Depressed Affect: Appropriate to circumstance Anxiety Level: Minimal Thought Processes: Coherent Judgement: Unimpaired Orientation: Person, Place, Time Obsessive Compulsive Thoughts/Behaviors: None  Cognitive Functioning Concentration: Decreased Memory: Recent Intact, Remote Intact Insight: Fair Impulse Control: Fair Appetite: Good Weight Loss: 0 Weight Gain: 0 Sleep: No Change Total Hours of Sleep: 8 Vegetative Symptoms: None  ADLScreening [facility] Assessment Services) Patient's cognitive ability adequate to safely complete daily activities?: Yes Patient able to express need for assistance with ADLs?: Yes Independently performs ADLs?: Yes (appropriate for developmental age)  Prior Inpatient Therapy Prior Inpatient Therapy: Yes Prior Therapy Dates: 2018 Prior Therapy Facilty/Provider(s): [facility] Reason for Treatment: MH issues  Prior Outpatient Therapy Prior Outpatient Therapy: Yes Prior Therapy Dates: Ongoing Prior Therapy Facilty/Provider(s): [facility] Reason for Treatment: Med mang Does patient have an ACCT team?: No Does patient have Intensive In-House Services?  : No Does patient have [facility] services? : No Does patient have P4CC services?: No  ADL Screening (condition at time of admission) Patient's cognitive ability adequate to safely complete daily activities?: Yes Is the patient deaf or have difficulty hearing?: No Does the patient have difficulty seeing, even  when wearing glasses/contacts?: No Does the patient have difficulty concentrating, remembering, or making decisions?: No Patient able to express need for assistance with ADLs?: Yes Does the patient have difficulty dressing or bathing?: No Independently performs ADLs?: Yes (appropriate for developmental age) Does the patient have  difficulty walking or climbing stairs?: No Weakness of Legs: None  Home Assistive Devices/Equipment Home Assistive Devices/Equipment: None  Therapy Consults (therapy consults require a physician order) PT Evaluation Needed: No OT Evalulation Needed: No SLP Evaluation Needed: No Abuse/Neglect Assessment (Assessment to be complete while patient is alone) Abuse/Neglect Assessment Can Be Completed: Yes Physical Abuse: Denies Verbal Abuse: Denies Sexual Abuse: Denies Exploitation of patient/patient's resources: Denies Self-Neglect: Denies Values / Beliefs Cultural Requests During Hospitalization: None Spiritual Requests During Hospitalization: None Consults Spiritual Care Consult Needed: No Social Work Consult Needed: No Regulatory affairs officer (For Healthcare) Does Patient Have a Medical Advance Directive?: No Would patient like information on creating a medical advance directive?: No - Patient declined    Additional Information 1:1 In Past 12 Months?: No CIRT Risk: No Elopement Risk: No Does patient have medical clearance?: Yes     Disposition:  Disposition Initial Assessment Completed for this Encounter: Yes Disposition of Patient: Other dispositions Other disposition(s): Other (Comment)(Observe and monitor for safety med eval)  On Site Evaluation by:  Mindi Curling, LCAS  Reviewed with Physician:  Jinny Blossom NP   [place] 10/21/2017 9:06 AM

## 2017-10-21 NOTE — Progress Notes (Signed)
Nursing Progress Note [id number]  D) Patient presents with blunted affect and depressed mood. Patient did not attend group. Patient is isolative to room. Patient denies SI/HI/AVH or pain. Patient contracts for safety on the unit. Patient reports she is adjusting to the unit with no concerns and states, "I have been here before". No scheduled medications due this shift. Patient denies need for sleep medication. Patient agrees to come to staff and verbalize needs.  A) Emotional support given. 1:1 interaction and active listening provided. Snacks and fluids offered. Opportunities for questions or concerns presented to patient. Patient encouraged to continue to work on treatment goals. Labs, vital signs and patient behavior monitored throughout shift. Patient safety maintained with q15 min safety checks. Low fall risk precautions in place and reviewed with patient; patient verbalized understanding.  R) Patient receptive to interaction with nurse. Patient remains safe on the unit at this time. Patient is resting in bed without complaints. Will continue to monitor.

## 2017-10-21 NOTE — Tx Team (Signed)
Initial Treatment Plan 10/21/2017 6:46 PM Nihira N Coury PEJ:[id number]    PATIENT STRESSORS: Medication change or noncompliance   PATIENT STRENGTHS: Curator fund of knowledge Motivation for treatment/growth Supportive family/friends   PATIENT IDENTIFIED PROBLEMS: Depression  Suicidal ideation  Psychosis  "Work on my coping skills"  "Work on preventing me from coming back to the hospital"             DISCHARGE CRITERIA:  Improved stabilization in mood, thinking, and/or behavior Verbal commitment to aftercare and medication compliance  PRELIMINARY DISCHARGE PLAN: Outpatient therapy Medication management  PATIENT/FAMILY INVOLVEMENT: This treatment plan has been presented to and reviewed with the patient, Sarah [name].  The patient and family have been given the opportunity to ask questions and make suggestions.  Windell Moment, RN 10/21/2017, 6:46 PM

## 2017-10-21 NOTE — ED Notes (Signed)
Patient denies pain, endorses SI but contracts for safety while on unit. Accepted her scheduled meds. No behavior issues noted.

## 2017-10-21 NOTE — Progress Notes (Signed)
Sarah [name] is a 42 year old female being admitted voluntarily [facility].  She came to the ED with suicidal ideation and was found trying to hang herself and he husband called 911.  She reported hearing voices telling her to kill herself.  She was unable to contract for safety on the unit.  During [facility] admission, she denies SI/HI or A/V hallucinations.  She stated that since she got the shot in the emergency room she is feeling some better.  She denies any pain or discomfort and appears to be in no physical distress.  Oriented her to the unit.  Admission paperwork completed and signed.  Belongings searched and secured in locker # 65, no contraband found.  Skin assessment completed and no skin issues noted.  Q 15 minute checks initiated for safety.  We will continue to monitor the progress towards her goals.

## 2017-10-22 DIAGNOSIS — X838XXA Intentional self-harm by other specified means, initial encounter: Secondary | ICD-10-CM

## 2017-10-22 DIAGNOSIS — T1491XA Suicide attempt, initial encounter: Secondary | ICD-10-CM

## 2017-10-22 DIAGNOSIS — Z818 Family history of other mental and behavioral disorders: Secondary | ICD-10-CM

## 2017-10-22 DIAGNOSIS — D473 Essential (hemorrhagic) thrombocythemia: Secondary | ICD-10-CM

## 2017-10-22 DIAGNOSIS — F17210 Nicotine dependence, cigarettes, uncomplicated: Secondary | ICD-10-CM

## 2017-10-22 DIAGNOSIS — F79 Unspecified intellectual disabilities: Secondary | ICD-10-CM

## 2017-10-22 DIAGNOSIS — F251 Schizoaffective disorder, depressive type: Principal | ICD-10-CM

## 2017-10-22 DIAGNOSIS — D649 Anemia, unspecified: Secondary | ICD-10-CM

## 2017-10-22 DIAGNOSIS — G932 Benign intracranial hypertension: Secondary | ICD-10-CM

## 2017-10-22 MED ORDER — SERTRALINE HCL 100 MG PO TABS
100.0000 mg | ORAL_TABLET | Freq: Every day | ORAL | Status: DC
  Administered 2017-10-23 – 2017-10-25 (×3): 100 mg via ORAL
  Filled 2017-10-22 (×5): qty 1

## 2017-10-22 NOTE — Progress Notes (Signed)
The patient mentioned in group that she had a good day and that she enjoyed the groups that were offered. Her goal for tomorrow is to work on her coping skills.

## 2017-10-22 NOTE — BHH Suicide Risk Assessment (Signed)
[facility] - Closed Admission Suicide Risk Assessment   Nursing information obtained from:  Patient Demographic factors:  NA Current Mental Status:  Self-harm thoughts, Self-harm behaviors Loss Factors:  NA Historical Factors:  Prior suicide attempts, Family history of mental illness or substance abuse Risk Reduction Factors:  Living with another person, especially a relative  Total Time spent with patient: 30 minutes Principal Problem: Schizoaffective disorder ([place]) Diagnosis:   Patient Active Problem List   Diagnosis Date Noted  . Schizoaffective disorder ([place]) [F25.9] 07/29/2017  . MDD (major depressive disorder), recurrent episode, severe ([place]) [F33.2] 02/19/2017  . Schizoaffective disorder, bipolar type ([place]) [F25.0] 01/06/2017  . 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 IIH (idiopathic intracranial hypertension) [G93.2] 01/24/2016  . Cannabis use disorder, moderate, dependence ([place]) [F12.20] 08/18/2015  . Schizoaffective disorder, depressive type ([place]) [F25.1] 01/15/2012    Class: Acute   Subjective Data:  42 y.o AAF, married ,lives with her husband. Background history of Schizoaffective disorder. Presented to the ER via emergency services. Her husband called after he found patient with belt round her neck. Patient has been off her antipsychotic depot medication for a month. She has been having visual and auditory hallucinations. She denies any stressors. Routine labs significant for anaemia, thrombocytosis and elevated bilirubin.  Toxicology is negative,  UDS negative. No alcohol. Patient was given her Abilify Depot at the ER. Patient is currently psychotic. She has had suicidal behavior in the past.  No access to weapons. She is cooperative with care. She has agreed to treatment recommendations. She has agreed to communicate suicidal thoughts of with staff if the thoughts becomes overwhelming.      Continued Clinical Symptoms:  Alcohol Use Disorder Identification Test Final Score  (AUDIT): 0 The "Alcohol Use Disorders Identification Test", Guidelines for Use in Primary Care, Second Edition.  World Pharmacologist [facility]). Score between 0-7:  no or low risk or alcohol related problems. Score between 8-15:  moderate risk of alcohol related problems. Score between 16-19:  high risk of alcohol related problems. Score 20 or above:  warrants further diagnostic evaluation for alcohol dependence and treatment.   CLINICAL FACTORS:   Schizoaffective disorder    Musculoskeletal: Strength & Muscle Tone: within normal limits Gait & Station: normal Patient leans: N/A  Psychiatric Specialty Exam: Physical Exam  ROS  Blood pressure (!) 90/58, pulse 98, temperature 98.2 F (36.8 C), temperature source Oral, resp. rate 16, height 5' 5.75" (1.67 m), weight 119.7 kg (264 lb).Body mass index is 42.94 kg/m.  General Appearance: As in H&P  Eye Contact:    Speech:    Volume:    Mood:    Affect:    Thought Process:    Orientation:    Thought Content:  As in H&P  Suicidal Thoughts:    Homicidal Thoughts:    Memory:    Judgement:    Insight:    Psychomotor Activity:    Concentration:    Recall:  As in H&P  Fund of Knowledge:    Language:    Akathisia:    Handed:    AIMS (if indicated):     Assets:    ADL's:    Cognition:  As in H&P  Sleep:         COGNITIVE FEATURES THAT CONTRIBUTE TO RISK:  Intellectual disability    SUICIDE RISK:   Moderate:  Frequent suicidal ideation with limited intensity, and duration, some specificity in terms of plans, no associated intent, good self-control, limited dysphoria/symptomatology, some risk factors present, and  identifiable protective factors, including available and accessible social support.  PLAN OF CARE:  As in H&P  I certify that inpatient services furnished can reasonably be expected to improve the patient's condition.   [place], MD 10/22/2017, 4:35 PM

## 2017-10-22 NOTE — Progress Notes (Signed)
Recreation Therapy Notes  Date: 10/22/17 Time: 0930 Location: [address] Dayroom  Group Topic: Stress Management  Goal Area(s) Addresses:  Patient will verbalize importance of using healthy stress management.  Patient will identify positive emotions associated with healthy stress management.   Behavioral Response: Engaged  Intervention: Stress Management  Activity :  Meditation.  LRT introduced the stress management group of meditation.  LRT played a meditation from the Calm app to participate in the meditation.  Patients were to listen and follow along with the meditation in order to engage in the activity.  Education:  Stress Management, Discharge Planning.   Education Outcome: Acknowledges edcuation/In group clarification offered/Needs additional education  Clinical Observations/Feedback: Pt attended group.    Victorino Sparrow, LRT/CTRS         Victorino Sparrow A 10/22/2017 12:29 PM

## 2017-10-22 NOTE — BHH Group Notes (Signed)
[facility] Mental Health Association Group Therapy 10/22/2017 1:15pm  Type of Therapy: Mental Health Association Presentation  Participation Level: Active  Participation Quality: Attentive  Affect: Appropriate  Cognitive: Oriented  Insight: Developing/Improving  Engagement in Therapy: Engaged  Modes of Intervention: Discussion, Education and Socialization  Summary of Progress/Problems: Sarah [name] ([name]) Speaker came to talk about his personal journey with mental health. The pt processed ways by which to relate to the speaker. [place] speaker provided handouts and educational information pertaining to groups and services offered by the [facility]. Pt was engaged in speaker's presentation and was receptive to resources provided.    Sarah Chars, LCSW 10/22/2017 4:13 PM

## 2017-10-22 NOTE — Progress Notes (Signed)
Pt reports that she had a good day.  Conversation was minimal with pt, but she did stay mostly in the dayroom watching TV and was not isolating.  She denies SI/HI/AVH.  She says she is feeling better since being started on her meds again.  She voiced no needs or concerns to Probation officer.  She presents as flat and depressed.  When speaking, she speaks in a monotone voice.  Support and encouragement offered.  Discharge plans are in process.  Pt plans to return home with her husband and mother-in-law at discharge.  Safety maintained with q15 minute checks.

## 2017-10-22 NOTE — Plan of Care (Signed)
  Progressing Health Behavior/Discharge Planning: Compliance with prescribed medication regimen will improve 10/22/2017 1757 - Progressing by Rockne Coons, RN

## 2017-10-22 NOTE — Tx Team (Signed)
Interdisciplinary Treatment and Diagnostic Plan Update  10/22/2017 Time of Session: 1096 Sarah [name] MRN: [id number]  Principal Diagnosis: <principal problem not specified>  Secondary Diagnoses: Active Problems:   Schizoaffective disorder (HCC)   Current Medications:  Current Facility-Administered Medications  Medication Dose Route Frequency Provider Last Rate Last Dose  . acetaminophen (TYLENOL) tablet 650 mg  650 mg Oral Q6H PRN Okonkwo, Justina A, NP      . alum & mag hydroxide-simeth (MAALOX/MYLANTA) 200-200-20 MG/5ML suspension 30 mL  30 mL Oral Q4H PRN Okonkwo, Justina A, NP      . amLODipine (NORVASC) tablet 10 mg  10 mg Oral Daily Okonkwo, Justina A, NP   10 mg at 10/22/17 0935  . [START ON 11/18/2017] ARIPiprazole ER SRER 400 mg  400 mg Intramuscular Q28 days Okonkwo, Justina A, NP      . hydrochlorothiazide (HYDRODIURIL) tablet 25 mg  25 mg Oral Daily Okonkwo, Justina A, NP   25 mg at 10/22/17 0935  . hydrOXYzine (ATARAX/VISTARIL) tablet 25 mg  25 mg Oral Q6H PRN Okonkwo, Justina A, NP      . magnesium hydroxide (MILK OF MAGNESIA) suspension 30 mL  30 mL Oral Daily PRN Okonkwo, Justina A, NP      . nicotine polacrilex (NICORETTE) gum 2 mg  2 mg Oral PRN [name], [name], MD      . sertraline (ZOLOFT) tablet 50 mg  50 mg Oral Daily Okonkwo, Justina A, NP   50 mg at 10/22/17 0935  . traZODone (DESYREL) tablet 150 mg  150 mg Oral QHS PRN Okonkwo, Justina A, NP       PTA Medications: Medications Prior to Admission  Medication Sig Dispense Refill Last Dose  . amLODipine (NORVASC) 10 MG tablet Take 1 tablet (10 mg total) by mouth daily. For high blood pressure 10 tablet 0 10/20/2017 at Unknown time  . ARIPiprazole (ABILIFY) 10 MG tablet Take 1 tablet (10 mg total) by mouth daily. (Patient not taking: Reported on 10/20/2017) 30 tablet 0 Not Taking at Unknown time  . ARIPiprazole ER 400 MG SRER Inject 400 mg into the muscle every 28 (twenty-eight) days. (Due on 08-29-17): For mood  control 1 each 0 Middle of December  . hydrochlorothiazide (HYDRODIURIL) 25 MG tablet Take 1 tablet (25 mg total) by mouth daily. For high blood pressure 10 tablet 0 10/20/2017 at Unknown time  . hydrOXYzine (ATARAX/VISTARIL) 25 MG tablet Take 1 tablet (25 mg total) by mouth every 6 (six) hours as needed for anxiety. 75 tablet 0 Past Month at Unknown time  . sertraline (ZOLOFT) 50 MG tablet Take 1 tablet (50 mg total) by mouth daily. (Patient taking differently: Take 50 mg by mouth at bedtime. ) 30 tablet 0 10/19/2017 at Unknown time  . traZODone (DESYREL) 150 MG tablet Take 1 tablet (150 mg total) by mouth at bedtime. For sleep (Patient taking differently: Take 150 mg by mouth at bedtime as needed for sleep. ) 30 tablet 0 Past Month at Unknown time    Patient Stressors: Medication change or noncompliance  Patient Strengths: Curator fund of knowledge Motivation for treatment/growth Supportive family/friends  Treatment Modalities: Medication Management, Group therapy, Case management,  1 to 1 session with clinician, Psychoeducation, Recreational therapy.   Physician Treatment Plan for Primary Diagnosis: <principal problem not specified> Long Term Goal(s):     Short Term Goals:    Medication Management: Evaluate patient's response, side effects, and tolerance of medication regimen.  Therapeutic Interventions: 1 to 1  sessions, Unit Group sessions and Medication administration.  Evaluation of Outcomes: Not Met  Physician Treatment Plan for Secondary Diagnosis: Active Problems:   Schizoaffective disorder ([place])  Long Term Goal(s):     Short Term Goals:       Medication Management: Evaluate patient's response, side effects, and tolerance of medication regimen.  Therapeutic Interventions: 1 to 1 sessions, Unit Group sessions and Medication administration.  Evaluation of Outcomes: Not Met   RN Treatment Plan for Primary Diagnosis: <principal problem not  specified> Long Term Goal(s): Knowledge of disease and therapeutic regimen to maintain health will improve  Short Term Goals: Ability to identify and develop effective coping behaviors will improve and Compliance with prescribed medications will improve  Medication Management: RN will administer medications as ordered by provider, will assess and evaluate patient's response and provide education to patient for prescribed medication. RN will report any adverse and/or side effects to prescribing provider.  Therapeutic Interventions: 1 on 1 counseling sessions, Psychoeducation, Medication administration, Evaluate responses to treatment, Monitor vital signs and CBGs as ordered, Perform/monitor CIWA, COWS, AIMS and Fall Risk screenings as ordered, Perform wound care treatments as ordered.  Evaluation of Outcomes: Not Met   LCSW Treatment Plan for Primary Diagnosis: <principal problem not specified> Long Term Goal(s): Safe transition to appropriate next level of care at discharge, Engage patient in therapeutic group addressing interpersonal concerns.  Short Term Goals: Engage patient in aftercare planning with referrals and resources, Increase social support and Increase skills for wellness and recovery  Therapeutic Interventions: Assess for all discharge needs, 1 to 1 time with Social worker, Explore available resources and support systems, Assess for adequacy in community support network, Educate family and significant other(s) on suicide prevention, Complete Psychosocial Assessment, Interpersonal group therapy.  Evaluation of Outcomes: Not Met   Progress in Treatment: Attending groups: No. Participating in groups: No. Taking medication as prescribed: Yes. Toleration medication: Yes. Family/Significant other contact made: No, will contact:  when given permission Patient understands diagnosis: Yes. Discussing patient identified problems/goals with staff: Yes. Medical problems stabilized or  resolved: Yes. Denies suicidal/homicidal ideation: Yes. Issues/concerns per patient self-inventory: No. Other: none  New problem(s) identified: No, Describe:  none  New Short Term/Long Term Goal(s): Pt goal: to go to group and work on Radiographer, therapeutic."  Discharge Plan or Barriers:   Reason for Continuation of Hospitalization: Depression Medication stabilization  Estimated Length of Stay: 3-5 days.  Attendees: Patient: 10/22/2017   Physician: Dr Sanjuana Letters, MD 10/22/2017   Nursing: Gaylan Gerold, RN 10/22/2017   RN Care Manager: 10/22/2017   Social Worker: Lurline Idol, LCSW 10/22/2017   Recreational Therapist:  10/22/2017   Other:  10/22/2017   Other:  10/22/2017   Other: 10/22/2017     Attendees: Patient: 10/22/2017 11:24 AM  Physician:  10/22/2017 11:24 AM  Nursing:  10/22/2017 11:24 AM  RN Care Manager: 10/22/2017 11:24 AM  Social Worker:  10/22/2017 11:24 AM  Recreational Therapist:  10/22/2017 11:24 AM  Other:  10/22/2017 11:24 AM  Other:  10/22/2017 11:24 AM  Other: 10/22/2017 11:24 AM    Scribe for Treatment Team: Joanne Chars, LCSW 10/22/2017 11:24 AM

## 2017-10-22 NOTE — Progress Notes (Signed)
Patient ID: Sarah [name], female   DOB: 22-Dec-1975, 42 y.o.   MRN: [id number]  DAR: Pt. Denies SI/HI and A/V Hallucinations. She reports that she is better after receiving her Abilify injection. Patient reports that she received this in the ED. She reports that her sleep last night was good, her appetite is good, her energy level is normal, and her concentration is good. She rates her depression, hopelessness, and anxiety 0/10. Patient does not report any pain or discomfort at this time. Support and encouragement provided to the patient. Scheduled medication administered to patient per physician's orders. Patient is seen in the milieu but does not appear to be interacting with peers. Her affect is flat and mood is depressed. She is cooperative but minimal with staff. She does appear to be attending some groups. Q15 minute checks are maintained for safety.
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
## 2017-10-22 NOTE — H&P (Signed)
Psychiatric Admission Assessment Adult  Patient Identification: Sarah [name] MRN:  [id number] Date of Evaluation:  10/22/2017 Chief Complaint:  Suicidal attempt Principal Diagnosis: Schizoaffective disorder ([place]) Diagnosis:   Patient Active Problem List   Diagnosis Date Noted  . Schizoaffective disorder ([place]) [F25.9] 07/29/2017  . MDD (major depressive disorder), recurrent episode, severe ([place]) [F33.2] 02/19/2017  . Schizoaffective disorder, bipolar type ([place]) [F25.0] 01/06/2017  . 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 IIH (idiopathic intracranial hypertension) [G93.2] 01/24/2016  . Cannabis use disorder, moderate, dependence ([place]) [F12.20] 08/18/2015  . Schizoaffective disorder, depressive type ([place]) [F25.1] 01/15/2012    Class: Acute   History of Present Illness:   42 y.o AAF, married ,lives with her husband. Background history of Schizoaffective disorder. Presented to the ER via emergency services. Her husband called after he found patient with belt round her neck. Patient has been off her antipsychotic depot medication for a month. She has been having visual and auditory hallucinations. She denies any stressors. Routine labs significant for anaemia, thrombocytosis and elevated bilirubin.  Toxicology is negative,  UDS negative. No alcohol. Patient was given her Abilify Depot at the ER.  At interview, patient tells me that she has been feeling better since she got her medication. Says she had been seeing ghosts at home. They commanded her to end her life. Patient says she has not seen them lately and she has not heard them lately. Says she had been struggling to sleep. She has felt very paranoid. Say she is feeling good now. She slept well last night. Says she does not have any intention to die. Suicide attempt was in context of command hallucination. No command to harm other. No recent substance use. Says she typically uses THC but has not had any in over a month. Denies use  of any other substance. No synthetic substance use. . No evidence of mania. No overwhelming anxiety. No evidence of PTSD. No current thoughts of harming others. No thoughts of violence. No access to weapons.    Total Time spent with patient: 1 hour  Past Psychiatric History: Schizoaffective disorder and SUD. She has had multiple hospitalization over the years. She is maintained on Abilify, Sertraline and Trazodone. Reports past suicidal behavior. Overdosed twice and was admitted medically.     Is the patient at risk to self? Yes.    Has the patient been a risk to self in the past 6 months? Yes  Has the patient been a risk to self within the distant past? Yes.    Is the patient a risk to others? No.  Has the patient been a risk to others in the past 6 months? No.  Has the patient been a risk to others within the distant past? No.   Prior Inpatient Therapy:   Prior Outpatient Therapy:    Alcohol Screening: 1. How often do you have a drink containing alcohol?: Never 2. How many drinks containing alcohol do you have on a typical day when you are drinking?: 1 or 2 3. How often do you have six or more drinks on one occasion?: Never AUDIT-C Score: 0 9. Have you or someone else been injured as a result of your drinking?: No 10. Has a relative or friend or a doctor or another health worker been concerned about your drinking or suggested you cut down?: No Alcohol Use Disorder Identification Test Final Score (AUDIT): 0 Intervention/Follow-up: AUDIT Score <7 follow-up not indicated Substance Abuse History in the last 12 months:  Yes.  Consequences of Substance Abuse: NA Previous Psychotropic Medications: Yes  Psychological Evaluations: Yes  Past Medical History:  Past Medical History:  Diagnosis Date  . Anemia   . Anxiety   . Depression   . Elevated bilirubin    slight elevation at 1.3  . Hypertension   . Hypokalemia   . Intracranial hypertension   . Migraines   . Mild intellectual  disability   . Schizoaffective disorder [facility])     Past Surgical History:  Procedure Laterality Date  . NO PAST SURGERIES     Family History:  Family History  Problem Relation Age of Onset  . Schizophrenia Mother   . Hypertension Mother   . Anesthesia problems Neg Hx   . Malignant hyperthermia Neg Hx   . Pseudochol deficiency Neg Hx   . Hypotension Neg Hx   . Migraines Neg Hx    Family Psychiatric  History: Mother has Schizoaffective disorder Tobacco Screening: Have you used any form of tobacco in the last 30 days? (Cigarettes, Smokeless Tobacco, Cigars, and/or Pipes): Yes Tobacco use, Select all that apply: 5 or more cigarettes per day Are you interested in Tobacco Cessation Medications?: Yes, will notify MD for an order Counseled patient on smoking cessation including recognizing danger situations, developing coping skills and basic information about quitting provided: Refused/Declined practical counseling Social History:  Social History   Substance and Sexual Activity  Alcohol Use No   Comment: denied all drug/alcohol use     Social History   Substance and Sexual Activity  Drug Use Yes  . Types: Marijuana   Comment: denied all drug/alcohol use    Additional Social History:       No kids Allergies:   Allergies  Allergen Reactions  . Penicillins Anaphylaxis    Has patient had a PCN reaction causing immediate rash, facial/tongue/throat swelling, SOB or lightheadedness with hypotension: throat and face swelling. YES Has patient had a PCN reaction causing severe rash involving mucus membranes or skin necrosis: Yes Has patient had a PCN reaction that required hospitalization Yes Has patient had a PCN reaction occurring within the last 10 years: No If all of the above answers are "NO", then may proceed with Cephalosporin use.   . Bee Venom Swelling  . Coconut Oil Hives and Swelling    ALLERGIES TO COCONUT   Lab Results:  Results for orders placed or performed during  the hospital encounter of 10/20/17 (from the past 48 hour(s))  Rapid urine drug screen (hospital performed)     Status: None   Collection Time: 10/20/17  6:00 PM  Result Value Ref Range   Opiates NONE DETECTED NONE DETECTED   Cocaine NONE DETECTED NONE DETECTED   Benzodiazepines NONE DETECTED NONE DETECTED   Amphetamines NONE DETECTED NONE DETECTED   Tetrahydrocannabinol NONE DETECTED NONE DETECTED   Barbiturates NONE DETECTED NONE DETECTED    Comment: (NOTE) DRUG SCREEN FOR MEDICAL PURPOSES ONLY.  IF CONFIRMATION IS NEEDED FOR ANY PURPOSE, NOTIFY LAB WITHIN 5 DAYS. LOWEST DETECTABLE LIMITS FOR URINE DRUG SCREEN Drug Class                     Cutoff (ng/mL) Amphetamine and metabolites    1000 Barbiturate and metabolites    200 Benzodiazepine                 035 Tricyclics and metabolites     300 Opiates and metabolites        300 Cocaine and metabolites  300 THC                            50     Blood Alcohol level:  Lab Results  Component Value Date   ETH <10 10/20/2017   ETH <10 95/28/4132    Metabolic Disorder Labs:  Lab Results  Component Value Date   HGBA1C 5.1 07/31/2017   MPG 99.67 07/31/2017   MPG 108 01/04/2017   Lab Results  Component Value Date   PROLACTIN 17.5 07/31/2017   PROLACTIN 18.7 01/04/2017   Lab Results  Component Value Date   CHOL 179 01/04/2017   TRIG 158 (H) 01/04/2017   HDL 58 01/04/2017   CHOLHDL 3.1 01/04/2017   VLDL 32 01/04/2017   LDLCALC 89 01/04/2017   LDLCALC 72 08/19/2015    Current Medications: Current Facility-Administered Medications  Medication Dose Route Frequency Provider Last Rate Last Dose  . acetaminophen (TYLENOL) tablet 650 mg  650 mg Oral Q6H PRN Okonkwo, Justina A, NP      . alum & mag hydroxide-simeth (MAALOX/MYLANTA) 200-200-20 MG/5ML suspension 30 mL  30 mL Oral Q4H PRN Okonkwo, Justina A, NP      . amLODipine (NORVASC) tablet 10 mg  10 mg Oral Daily Okonkwo, Justina A, NP   10 mg at 10/22/17 0935   . [START ON 11/18/2017] ARIPiprazole ER SRER 400 mg  400 mg Intramuscular Q28 days Okonkwo, Justina A, NP      . hydrochlorothiazide (HYDRODIURIL) tablet 25 mg  25 mg Oral Daily Okonkwo, Justina A, NP   25 mg at 10/22/17 0935  . hydrOXYzine (ATARAX/VISTARIL) tablet 25 mg  25 mg Oral Q6H PRN Okonkwo, Justina A, NP      . magnesium hydroxide (MILK OF MAGNESIA) suspension 30 mL  30 mL Oral Daily PRN Okonkwo, Justina A, NP      . nicotine polacrilex (NICORETTE) gum 2 mg  2 mg Oral PRN [name], [name], MD      . sertraline (ZOLOFT) tablet 50 mg  50 mg Oral Daily Okonkwo, Justina A, NP   50 mg at 10/22/17 0935  . traZODone (DESYREL) tablet 150 mg  150 mg Oral QHS PRN Okonkwo, Justina A, NP       PTA Medications: Medications Prior to Admission  Medication Sig Dispense Refill Last Dose  . amLODipine (NORVASC) 10 MG tablet Take 1 tablet (10 mg total) by mouth daily. For high blood pressure 10 tablet 0 10/20/2017 at Unknown time  . ARIPiprazole (ABILIFY) 10 MG tablet Take 1 tablet (10 mg total) by mouth daily. (Patient not taking: Reported on 10/20/2017) 30 tablet 0 Not Taking at Unknown time  . ARIPiprazole ER 400 MG SRER Inject 400 mg into the muscle every 28 (twenty-eight) days. (Due on 08-29-17): For mood control 1 each 0 Middle of December  . hydrochlorothiazide (HYDRODIURIL) 25 MG tablet Take 1 tablet (25 mg total) by mouth daily. For high blood pressure 10 tablet 0 10/20/2017 at Unknown time  . hydrOXYzine (ATARAX/VISTARIL) 25 MG tablet Take 1 tablet (25 mg total) by mouth every 6 (six) hours as needed for anxiety. 75 tablet 0 Past Month at Unknown time  . sertraline (ZOLOFT) 50 MG tablet Take 1 tablet (50 mg total) by mouth daily. (Patient taking differently: Take 50 mg by mouth at bedtime. ) 30 tablet 0 10/19/2017 at Unknown time  . traZODone (DESYREL) 150 MG tablet Take 1 tablet (150 mg total) by mouth at bedtime.  For sleep (Patient taking differently: Take 150 mg by mouth at bedtime as needed for  sleep. ) 30 tablet 0 Past Month at Unknown time    Musculoskeletal: Strength & Muscle Tone: within normal limits Gait & Station: normal Patient leans: N/A  Psychiatric Specialty Exam: Physical Exam  Constitutional: She is oriented to person, place, and time. She appears well-developed and well-nourished.  HENT:  Head: Normocephalic and atraumatic.  Respiratory: Effort normal.  Neurological: She is alert and oriented to person, place, and time.  Psychiatric:  As above.     ROS  Blood pressure (!) 90/58, pulse 98, temperature 98.2 F (36.8 C), temperature source Oral, resp. rate 16, height 5' 5.75" (1.67 m), weight 119.7 kg (264 lb).Body mass index is 42.94 kg/m.  General Appearance: Overweight, hirsute, in hospital clothing. Poor personal hygiene. Withdrawn. Calm and cooperative. Odd giggling.   Eye Contact:  Fair  Speech:  Decreased rate and tone  Volume:  Soft spoken  Mood:  Says she is feeling better now.  Affect:  Odd and has a psychotic quality   Thought Process:  Linear  Orientation:  Full (Time, Place, and Person)  Thought Content:  No delusional theme. No preoccupation with violent thoughts. No negative ruminations. No obsession.  No hallucination in any modality.   Suicidal Thoughts:  None currently  Homicidal Thoughts:  No  Memory:  Immediate;   Good Recent;   Fair Remote;   Fair  Judgement:  Fair  Insight:  Good  Psychomotor Activity:  Decreased  Concentration:  Concentration: Fair and Attention Span: Fair  Recall:  [employer] of Knowledge:  Fair  Language:  Good  Akathisia:  Negative  Handed:    AIMS (if indicated):     Assets:  Housing Intimacy Resilience  ADL's:  Impaired  Cognition:  WNL  Sleep:       Treatment Plan Summary: Relapsed psychosis and depression. Precipitated by partial adherence. Patient has been received her LAI Aripiprazole. She is tolerating it well. We have agreed to titrate her Sertraline to target depression. We would evaluate  her further and gather collateral from her family.  Psychiatric: Schizoaffective disorder Intellectual Disability  Medical: Overweight. Intracranial Hypertension.   Psychosocial:   PLAN: 1. Continue current dose of Abilify Maintenna and Trazodone 2. Increase Sertraline to 100 mg daily 3. Encourage unit groups and therapeutic activities 4. Monitor mood, behavior and interaction with peers 5. SW would gather collateral from her family and coordinate aftercare 6. Continue home medical medications at home dose   Observation Level/Precautions:  15 minute checks  Laboratory:    Psychotherapy:    Medications:    Consultations:    Discharge Concerns:    Estimated LOS:  Other:     Physician Treatment Plan for Primary Diagnosis: Schizoaffective disorder ([place]) Long Term Goal(s): Improvement in symptoms so as ready for discharge  Short Term Goals: Ability to identify changes in lifestyle to reduce recurrence of condition will improve, Ability to verbalize feelings will improve, Ability to disclose and discuss suicidal ideas, Ability to demonstrate self-control will improve, Ability to identify and develop effective coping behaviors will improve, Ability to maintain clinical measurements within normal limits will improve and Compliance with prescribed medications will improve  Physician Treatment Plan for Secondary Diagnosis: Principal Problem:   Schizoaffective disorder ([place])  Long Term Goal(s): Improvement in symptoms so as ready for discharge  Short Term Goals: Ability to identify changes in lifestyle to reduce recurrence of condition will improve, Ability to verbalize  feelings will improve, Ability to disclose and discuss suicidal ideas, Ability to demonstrate self-control will improve, Ability to identify and develop effective coping behaviors will improve, Ability to maintain clinical measurements within normal limits will improve and Compliance with prescribed medications will  improve  I certify that inpatient services furnished can reasonably be expected to improve the patient's condition.    [place], MD 1/16/20194:13 PM

## 2017-10-23 DIAGNOSIS — G4700 Insomnia, unspecified: Secondary | ICD-10-CM

## 2017-10-23 DIAGNOSIS — F419 Anxiety disorder, unspecified: Secondary | ICD-10-CM

## 2017-10-23 DIAGNOSIS — F39 Unspecified mood [affective] disorder: Secondary | ICD-10-CM

## 2017-10-23 MED ORDER — ARIPIPRAZOLE ER 400 MG IM SRER: 400.0000 mg | INTRAMUSCULAR | Status: DC

## 2017-10-23 NOTE — Progress Notes (Signed)
Adult Psychoeducational Group Note  Date:  10/23/2017 Time:  0915 Group Topic/Focus:  TED talk on vulnerability   Participation Level:  Minimal  Participation Quality:  Supportive  Affect:  Appropriate  Cognitive:  Alert and Appropriate  Insight: None  Engagement in Group:  Engaged  Modes of Intervention:  Discussion, Education and Support  Additional Comments:  Pt participated in group discussion after TED talk  Larkin Ina Patience 10/23/2017, 12:10 PM

## 2017-10-23 NOTE — [facility] Suicide Risk Assessment (Signed)
[facility] INPATIENT:  Family/Significant Other Suicide Prevention Education  Suicide Prevention Education:  Contact Attempts: Jarold Motto, husband, [phone] been identified by the patient as the family member/significant other with whom the patient will be residing, and identified as the person(s) who will aid the patient in the event of a mental health crisis.  With written consent from the patient, two attempts were made to provide suicide prevention education, prior to and/or following the patient's discharge.  We were unsuccessful in providing suicide prevention education.  A suicide education pamphlet was given to the patient to share with family/significant other.  Date and time of first attempt:10/23/17, 1235 Date and time of second attempt:  Joanne Chars, LCSW 10/23/2017, 12:35 PM

## 2017-10-23 NOTE — Progress Notes (Signed)
[facility] MD Progress Note  10/23/2017 1:51 PM Sarah [name]  MRN:  [id number]   Subjective:  Patient reports that she is doing good today. SH states that she has been eating good today and had a good nights sleep. She states that she has not had any hallucinations since she has been here and got her Abilify injection. She states that she was established with an ACTT team in November of 2018 but they never came to see her at home. She went to [facility] in December to get her injection. She cannot remember which [facility] she was set up with.   Objective: Patient's chart and findings reviewed and discussed with treatment team. Patient presents in her room lying in bed. After discussing history with patient and reviewing chart, patient seems to decompensate within 3 weeks of her 4 week injection span. Will suggest that patient either continue Abilify Maintena except get the injection every 3 weeks or change to Aristada 882 mg IM every 4 weeks. The patient agrees to either, but will have CSW to establish patient with outpatient provider to ensure compliance.  Principal Problem: Schizoaffective disorder ([place]) Diagnosis:   Patient Active Problem List   Diagnosis Date Noted  . Schizoaffective disorder ([place]) [F25.9] 07/29/2017  . MDD (major depressive disorder), recurrent episode, severe ([place]) [F33.2] 02/19/2017  . Schizoaffective disorder, bipolar type ([place]) [F25.0] 01/06/2017  . 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 IIH (idiopathic intracranial hypertension) [G93.2] 01/24/2016  . Cannabis use disorder, moderate, dependence ([place]) [F12.20] 08/18/2015  . Schizoaffective disorder, depressive type ([place]) [F25.1] 01/15/2012    Class: Acute   Total Time spent with patient: 15 minutes  Past Psychiatric History: See H&P  Past Medical History:  Past Medical History:  Diagnosis Date  . Anemia   . Anxiety   . Depression   . Elevated bilirubin    slight elevation at 1.3  . Hypertension    . Hypokalemia   . Intracranial hypertension   . Migraines   . Mild intellectual disability   . Schizoaffective disorder [facility])     Past Surgical History:  Procedure Laterality Date  . NO PAST SURGERIES     Family History:  Family History  Problem Relation Age of Onset  . Schizophrenia Mother   . Hypertension Mother   . Anesthesia problems Neg Hx   . Malignant hyperthermia Neg Hx   . Pseudochol deficiency Neg Hx   . Hypotension Neg Hx   . Migraines Neg Hx    Family Psychiatric  History: See H&P Social History:  Social History   Substance and Sexual Activity  Alcohol Use No   Comment: denied all drug/alcohol use     Social History   Substance and Sexual Activity  Drug Use Yes  . Types: Marijuana   Comment: denied all drug/alcohol use    Social History   Socioeconomic History  . Marital status: Single    Spouse name: None  . Number of children: 0  . Years of education: 62  . Highest education level: None  Social Needs  . Financial resource strain: None  . Food insecurity - worry: None  . Food insecurity - inability: None  . Transportation needs - medical: None  . Transportation needs - non-medical: None  Occupational History  . Occupation: Unemployed  Tobacco Use  . Smoking status: Current Every Day Smoker    Packs/day: 1.00    Years: 15.00    Pack years: 15.00    Types: Cigarettes  Last attempt to quit: 01/02/2017    Years since quitting: 0.8  . Smokeless tobacco: Never Used  . Tobacco comment: smoked since 42 yrs old  Substance and Sexual Activity  . Alcohol use: No    Comment: denied all drug/alcohol use  . Drug use: Yes    Types: Marijuana    Comment: denied all drug/alcohol use  . Sexual activity: Yes    Birth control/protection: None  Other Topics Concern  . None  Social History Narrative   Lives with fiance, Erlene Quan   Caffeine use: Soda: 1 soda daily   No coffee   No tea   Additional Social History:                          Sleep: Good  Appetite:  Good  Current Medications: Current Facility-Administered Medications  Medication Dose Route Frequency Provider Last Rate Last Dose  . acetaminophen (TYLENOL) tablet 650 mg  650 mg Oral Q6H PRN Okonkwo, Justina A, NP      . alum & mag hydroxide-simeth (MAALOX/MYLANTA) 200-200-20 MG/5ML suspension 30 mL  30 mL Oral Q4H PRN Okonkwo, Justina A, NP      . amLODipine (NORVASC) tablet 10 mg  10 mg Oral Daily Okonkwo, Justina A, NP   10 mg at 10/23/17 0747  . [START ON 11/18/2017] ARIPiprazole ER SRER 400 mg  400 mg Intramuscular Q28 days Okonkwo, Justina A, NP      . hydrochlorothiazide (HYDRODIURIL) tablet 25 mg  25 mg Oral Daily Okonkwo, Justina A, NP   25 mg at 10/23/17 0747  . hydrOXYzine (ATARAX/VISTARIL) tablet 25 mg  25 mg Oral Q6H PRN Okonkwo, Justina A, NP      . magnesium hydroxide (MILK OF MAGNESIA) suspension 30 mL  30 mL Oral Daily PRN Okonkwo, Justina A, NP      . nicotine polacrilex (NICORETTE) gum 2 mg  2 mg Oral PRN [name], [name], MD      . sertraline (ZOLOFT) tablet 100 mg  100 mg Oral Daily Izediuno, Laruth Bouchard, MD   100 mg at 10/23/17 0747  . traZODone (DESYREL) tablet 150 mg  150 mg Oral QHS PRN Lu Duffel, Justina A, NP   150 mg at 10/22/17 2121    Lab Results: No results found for this or any previous visit (from the past 48 hour(s)).  Blood Alcohol level:  Lab Results  Component Value Date   ETH <10 10/20/2017   ETH <10 11/91/4782    Metabolic Disorder Labs: Lab Results  Component Value Date   HGBA1C 5.1 07/31/2017   MPG 99.67 07/31/2017   MPG 108 01/04/2017   Lab Results  Component Value Date   PROLACTIN 17.5 07/31/2017   PROLACTIN 18.7 01/04/2017   Lab Results  Component Value Date   CHOL 179 01/04/2017   TRIG 158 (H) 01/04/2017   HDL 58 01/04/2017   CHOLHDL 3.1 01/04/2017   VLDL 32 01/04/2017   LDLCALC 89 01/04/2017   LDLCALC 72 08/19/2015    Physical Findings: AIMS: Facial and Oral Movements Muscles of Facial  Expression: None, normal Lips and Perioral Area: None, normal Jaw: None, normal Tongue: None, normal,Extremity Movements Upper (arms, wrists, hands, fingers): None, normal Lower (legs, knees, ankles, toes): None, normal, Trunk Movements Neck, shoulders, hips: None, normal, Overall Severity Severity of abnormal movements (highest score from questions above): None, normal Incapacitation due to abnormal movements: None, normal Patient's awareness of abnormal movements (rate only patient's report): No Awareness,  Dental Status Current problems with teeth and/or dentures?: No Does patient usually wear dentures?: No  CIWA:    COWS:     Musculoskeletal: Strength & Muscle Tone: within normal limits Gait & Station: normal Patient leans: N/A  Psychiatric Specialty Exam: Physical Exam  Nursing note and vitals reviewed. Constitutional: She is oriented to person, place, and time. She appears well-developed and well-nourished.  Cardiovascular: Normal rate.  Respiratory: Effort normal.  Musculoskeletal: Normal range of motion.  Neurological: She is alert and oriented to person, place, and time.  Skin: Skin is warm.    Review of Systems  Constitutional: Negative.   HENT: Negative.   Eyes: Negative.   Respiratory: Negative.   Cardiovascular: Negative.   Gastrointestinal: Negative.   Genitourinary: Negative.   Musculoskeletal: Negative.   Skin: Negative.   Neurological: Negative.   Endo/Heme/Allergies: Negative.   Psychiatric/Behavioral: Negative.     Blood pressure 119/74, pulse 94, temperature 98.4 F (36.9 C), temperature source Oral, resp. rate 18, height 5' 5.75" (1.67 m), weight 119.7 kg (264 lb).Body mass index is 42.94 kg/m.  General Appearance: Casual  Eye Contact:  Good  Speech:  Clear and Coherent and Normal Rate  Volume:  Normal  Mood:  Euthymic  Affect:  Appropriate  Thought Process:  Goal Directed and Descriptions of Associations: Intact  Orientation:  Full (Time,  Place, and Person)  Thought Content:  WDL  Suicidal Thoughts:  No  Homicidal Thoughts:  No  Memory:  Immediate;   Good Recent;   Good Remote;   Good  Judgement:  Fair  Insight:  Good  Psychomotor Activity:  Normal  Concentration:  Concentration: Good and Attention Span: Good  Recall:  Good  Fund of Knowledge:  Good  Language:  Good  Akathisia:  No  Handed:  Right  AIMS (if indicated):     Assets:  Communication Skills Desire for Improvement Financial Resources/Insurance Housing Physical Health Social Support Transportation  ADL's:  Intact  Cognition:  WNL  Sleep:  Number of Hours: 6   Problems Addressed: Schizoaffective  Treatment Plan Summary: Daily contact with patient to assess and evaluate symptoms and progress in treatment, Medication management and Plan is to:  -Continue Zoloft 100 mg PO Daily for mood stability -Change Abilify Maintena 400 mg IM Q3 weeks Next dose due 11/11/17 or switch to Aristda 882 mg IM Q4 weeks.  -Continue Trazodone 150 mg PO QHS PRN for insomnia -Continue Vistaril 25 mg PO Q6H PRN for anxiety -Encourage group therapy participation  Lewis Shock, FNP 10/23/2017, 1:51 PM

## 2017-10-23 NOTE — Progress Notes (Signed)
Pt presents with an animated affect and anxious mood on approach. Pt noted to be laughing and smiling during assessment. Pt reports decreased depression and anxiety today.  Pt reports fair sleep last night. Pt denies SI. Pt stated goal today is to work on Radiographer, therapeutic. Pt observed participating in scheduled groups to work on coping skills.  Medications administered as ordered per MD. Verbal support provided. Pt encouraged to attend groups. 15 minute checks performed for safety.  Pt compliant with tx plan.

## 2017-10-23 NOTE — BHH Group Notes (Signed)
[place] LCSW Group Therapy Note  Date/Time: 10/23/17, 1315  Type of Therapy/Topic:  Group Therapy:  Balance in Life  Participation Level:  minimal  Description of Group:    This group will address the concept of balance and how it feels and looks when one is unbalanced. Patients will be encouraged to process areas in their lives that are out of balance, and identify reasons for remaining unbalanced. Facilitators will guide patients utilizing problem- solving interventions to address and correct the stressor making their life unbalanced. Understanding and applying boundaries will be explored and addressed for obtaining  and maintaining a balanced life. Patients will be encouraged to explore ways to assertively make their unbalanced needs known to significant others in their lives, using other group members and facilitator for support and feedback.  Therapeutic Goals: 1. Patient will identify two or more emotions or situations they have that consume much of in their lives. 2. Patient will identify signs/triggers that life has become out of balance:  3. Patient will identify two ways to set boundaries in order to achieve balance in their lives:  4. Patient will demonstrate ability to communicate their needs through discussion and/or role plays  Summary of Patient Progress::Pt identified physical and family as current areas that are out of balance but did not participate in group discussion about how to identify and respond positively in these situations. Pt did respond to CSW questions.         Therapeutic Modalities:   Cognitive Behavioral Therapy Solution-Focused Therapy Assertiveness Training  Lurline Idol, [place]

## 2017-10-23 NOTE — Progress Notes (Signed)
Nursing Progress Note [id number]  D) Patient presents with flat affect but brightens upon interaction. Patient was minimal but cooperative with writer this evening and requested a Trazodone at bedtime. Patient denies SI/HI/AVH or pain. Patient contracts for safety on the unit. Patient denies concerns for writer this evening.  A) Emotional support given. 1:1 interaction and active listening provided. Patient medicated as prescribed. Medications and plan of care reviewed with patient. Patient verbalized understanding without further questions. Snacks and fluids provided. Opportunities for questions or concerns presented to patient. Patient encouraged to continue to work on treatment goals. Labs, vital signs and patient behavior monitored throughout shift. Patient safety maintained with q15 min safety checks. Low fall risk precautions in place and reviewed with patient; patient verbalized understanding.  R) Patient receptive to interaction with nurse. Patient remains safe on the unit at this time. Patient denies any adverse medication reactions at this time. Patient is resting in bed without complaints. Will continue to monitor.

## 2017-10-24 DIAGNOSIS — Z560 Unemployment, unspecified: Secondary | ICD-10-CM

## 2017-10-24 NOTE — BHH Group Notes (Signed)
  [place] LCSW Group Therapy Note  Date/Time: 10/24/17, 1315  Type of Therapy/Topic:  Group Therapy:  Emotion Regulation  Participation Level:  Minimal   Mood: pleasant  Description of Group:    The purpose of this group is to assist patients in learning to regulate negative emotions and experience positive emotions. Patients will be guided to discuss ways in which they have been vulnerable to their negative emotions. These vulnerabilities will be juxtaposed with experiences of positive emotions or situations, and patients challenged to use positive emotions to combat negative ones. Special emphasis will be placed on coping with negative emotions in conflict situations, and patients will process healthy conflict resolution skills.  Therapeutic Goals: 1. Patient will identify two positive emotions or experiences to reflect on in order to balance out negative emotions:  2. Patient will label two or more emotions that they find the most difficult to experience:  3. Patient will be able to demonstrate positive conflict resolution skills through discussion or role plays:   Summary of Patient Progress: Pt identified anger and depression as emotions that are difficult for her to experience.  Pt was mostly quiet during group discussions but did answer CSW questions.       Therapeutic Modalities:   Cognitive Behavioral Therapy Feelings Identification Dialectical Behavioral Therapy  Lurline Idol, LCSW

## 2017-10-24 NOTE — Progress Notes (Signed)
D.  Pt in bed on approach, no complaints voiced.  Pt did not get up to attend evening wrap up group, minimal interaction.  Pt denies SI/HI/AVH at this time.  A.  Support and encouragement offered  R.  Pt remains safe on the unit, will continue to monitor.

## 2017-10-24 NOTE — Progress Notes (Signed)
[facility] MD Progress Note  10/24/2017 2:40 PM Sarah [name]  MRN:  [id number] Subjective:   42 y.o AAF, married ,lives with her husband. Background history of Schizoaffective disorder. Presented to the ER via emergency services. Her husband called after he found patient with belt round her neck. Patient has been off her antipsychotic depot medication for a month. She has been having visual and auditory hallucinations. She denies any stressors. Routine labs significant for anaemia, thrombocytosis and elevated bilirubin.  Toxicology is negative,  UDS negative. No alcohol. Patient was given her Abilify Depot at the ER.  Chart reviewed today. Patient discussed at team today.  Staff reports that she has been at her baseline. Odd mannerisms related to level of intellectual ability. No overt internal stimulation. No behavioral issues. Normal biological functions. Participating at groups. Her husband called earlier today  He is coming to visit her tonight. He wants to know how soon she could come home.  Seen today. Says she is feeling good. She discussed what they did today at groups. She is not expressing any concerns. No abnormal perception. No abnormal belief. She is tolerating her medication well.   Principal Problem: Schizoaffective disorder ([place]) Diagnosis:   Patient Active Problem List   Diagnosis Date Noted  . Schizoaffective disorder ([place]) [F25.9] 07/29/2017  . MDD (major depressive disorder), recurrent episode, severe ([place]) [F33.2] 02/19/2017  . Schizoaffective disorder, bipolar type ([place]) [F25.0] 01/06/2017  . 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 IIH (idiopathic intracranial hypertension) [G93.2] 01/24/2016  . Cannabis use disorder, moderate, dependence ([place]) [F12.20] 08/18/2015  . Schizoaffective disorder, depressive type ([place]) [F25.1] 01/15/2012    Class: Acute   Total Time spent with patient: 20 minutes  Past Psychiatric History: As in H&P  Past Medical History:  Past  Medical History:  Diagnosis Date  . Anemia   . Anxiety   . Depression   . Elevated bilirubin    slight elevation at 1.3  . Hypertension   . Hypokalemia   . Intracranial hypertension   . Migraines   . Mild intellectual disability   . Schizoaffective disorder [facility])     Past Surgical History:  Procedure Laterality Date  . NO PAST SURGERIES     Family History:  Family History  Problem Relation Age of Onset  . Schizophrenia Mother   . Hypertension Mother   . Anesthesia problems Neg Hx   . Malignant hyperthermia Neg Hx   . Pseudochol deficiency Neg Hx   . Hypotension Neg Hx   . Migraines Neg Hx    Family Psychiatric  History: As in H&P Social History:  Social History   Substance and Sexual Activity  Alcohol Use No   Comment: denied all drug/alcohol use     Social History   Substance and Sexual Activity  Drug Use Yes  . Types: Marijuana   Comment: denied all drug/alcohol use    Social History   Socioeconomic History  . Marital status: Single    Spouse name: None  . Number of children: 0  . Years of education: 58  . Highest education level: None  Social Needs  . Financial resource strain: None  . Food insecurity - worry: None  . Food insecurity - inability: None  . Transportation needs - medical: None  . Transportation needs - non-medical: None  Occupational History  . Occupation: Unemployed  Tobacco Use  . Smoking status: Current Every Day Smoker    Packs/day: 1.00    Years: 15.00  Pack years: 15.00    Types: Cigarettes    Last attempt to quit: 01/02/2017    Years since quitting: 0.8  . Smokeless tobacco: Never Used  . Tobacco comment: smoked since 42 yrs old  Substance and Sexual Activity  . Alcohol use: No    Comment: denied all drug/alcohol use  . Drug use: Yes    Types: Marijuana    Comment: denied all drug/alcohol use  . Sexual activity: Yes    Birth control/protection: None  Other Topics Concern  . None  Social History Narrative   Lives  with fiance, Erlene Quan   Caffeine use: Soda: 1 soda daily   No coffee   No tea   Additional Social History:        Sleep: Good  Appetite:  Good  Current Medications: Current Facility-Administered Medications  Medication Dose Route Frequency Provider Last Rate Last Dose  . acetaminophen (TYLENOL) tablet 650 mg  650 mg Oral Q6H PRN Okonkwo, Justina A, NP      . alum & mag hydroxide-simeth (MAALOX/MYLANTA) 200-200-20 MG/5ML suspension 30 mL  30 mL Oral Q4H PRN Okonkwo, Justina A, NP      . amLODipine (NORVASC) tablet 10 mg  10 mg Oral Daily Okonkwo, Justina A, NP   10 mg at 10/24/17 1126  . [START ON 11/11/2017] ARIPiprazole ER SRER 400 mg  400 mg Intramuscular Q21 days Money, Lowry Ram, FNP      . hydrochlorothiazide (HYDRODIURIL) tablet 25 mg  25 mg Oral Daily Okonkwo, Justina A, NP   25 mg at 10/24/17 1127  . hydrOXYzine (ATARAX/VISTARIL) tablet 25 mg  25 mg Oral Q6H PRN Okonkwo, Justina A, NP      . magnesium hydroxide (MILK OF MAGNESIA) suspension 30 mL  30 mL Oral Daily PRN Okonkwo, Justina A, NP      . nicotine polacrilex (NICORETTE) gum 2 mg  2 mg Oral PRN [name], [name], MD      . sertraline (ZOLOFT) tablet 100 mg  100 mg Oral Daily Tashiba Timoney A, MD   100 mg at 10/24/17 1126  . traZODone (DESYREL) tablet 150 mg  150 mg Oral QHS PRN Lu Duffel, Justina A, NP   150 mg at 10/23/17 2100    Lab Results: No results found for this or any previous visit (from the past 48 hour(s)).  Blood Alcohol level:  Lab Results  Component Value Date   ETH <10 10/20/2017   ETH <10 29/93/7169    Metabolic Disorder Labs: Lab Results  Component Value Date   HGBA1C 5.1 07/31/2017   MPG 99.67 07/31/2017   MPG 108 01/04/2017   Lab Results  Component Value Date   PROLACTIN 17.5 07/31/2017   PROLACTIN 18.7 01/04/2017   Lab Results  Component Value Date   CHOL 179 01/04/2017   TRIG 158 (H) 01/04/2017   HDL 58 01/04/2017   CHOLHDL 3.1 01/04/2017   VLDL 32 01/04/2017   LDLCALC 89  01/04/2017   LDLCALC 72 08/19/2015    Physical Findings: AIMS: Facial and Oral Movements Muscles of Facial Expression: None, normal Lips and Perioral Area: None, normal Jaw: None, normal Tongue: None, normal,Extremity Movements Upper (arms, wrists, hands, fingers): None, normal Lower (legs, knees, ankles, toes): None, normal, Trunk Movements Neck, shoulders, hips: None, normal, Overall Severity Severity of abnormal movements (highest score from questions above): None, normal Incapacitation due to abnormal movements: None, normal Patient's awareness of abnormal movements (rate only patient's report): No Awareness, Dental Status Current problems with teeth  and/or dentures?: No Does patient usually wear dentures?: No  CIWA:    COWS:     Musculoskeletal: Strength & Muscle Tone: within normal limits Gait & Station: normal Patient leans: N/A  Psychiatric Specialty Exam: Physical Exam  Constitutional: She appears well-developed and well-nourished.  HENT:  Head: Normocephalic and atraumatic.  Respiratory: Effort normal.  Neurological: She is alert.  Psychiatric:  As above    ROS  Blood pressure 117/71, pulse (!) 111, temperature 98.2 F (36.8 C), temperature source Oral, resp. rate 18, height 5' 5.75" (1.67 m), weight 119.7 kg (264 lb).Body mass index is 42.94 kg/m.  General Appearance: In hospital clothing, pleasant, good relatedness. Appropriate behavior.   Eye Contact:  Good  Speech:  Clear and Coherent and Normal Rate  Volume:  Normal  Mood:  Euthymic  Affect:  Appropriate  Thought Process:  Linear  Orientation:  Full (Time, Place, and Person)  Thought Content:  No delusional theme. No preoccupation with violent thoughts. No negative ruminations. No obsession.  No hallucination in any modality.   Suicidal Thoughts:  No  Homicidal Thoughts:  No  Memory:  Immediate;   Good Recent;   Good Remote;   Good  Judgement:  Good  Insight:  Good  Psychomotor Activity:  Normal   Concentration:  Concentration: Good and Attention Span: Good  Recall:  Good  Fund of Knowledge:  Good  Language:  Good  Akathisia:  Negative  Handed:    AIMS (if indicated):     Assets:  Desire for Improvement Housing Intimacy Resilience  ADL's:  Better  Cognition:  WNL  Sleep:  Number of Hours: 6.75     Treatment Plan Summary: Patient has responded well to treatment. Psychosis seems to have resolved. No dangerousness. Her husband is coming in this evening. He would give [place] feedback. Hopeful discharge tomorrow if she maintains stability.   Psychiatric: Schizoaffective disorder Intellectual Disability  Medical: Overweight. Intracranial Hypertension.   Psychosocial:   PLAN: 1. Continue current regimen 2. Continue to monitor mood, behavior and interaction with peers     Artist Beach, MD 10/24/2017, 2:40 PM

## 2017-10-24 NOTE — Progress Notes (Signed)
Recreation Therapy Notes  Date: 10/24/17 Time: 0930 Location: 300 Hall Dayroom  Group Topic: Stress Management  Goal Area(s) Addresses:  Patient will verbalize importance of using healthy stress management.  Patient will identify positive emotions associated with healthy stress management.   Behavioral Response: Engaged  Intervention: Stress Management  Activity :  Meditation.  LRT introduced the stress management technique of meditation.  LRT played a meditation dealing with gratitude.  Patients were to listen and follow along as the meditation played.  Education:  Stress Management, Discharge Planning.   Education Outcome: Acknowledges edcuation/In group clarification offered/Needs additional education  Clinical Observations/Feedback: Pt attended group.    Victorino Sparrow, LRT/CTRS         [name], Sofija Antwi A 10/24/2017 1:12 PM

## 2017-10-24 NOTE — Progress Notes (Signed)
The patient verbalized in group that she had a good day overall and that she enjoyed the groups. As for the theme of the day, her relapse prevention strategy will be to remain on her medication.

## 2017-10-24 NOTE — Progress Notes (Signed)
CSW spoke to [place] at [facility].  Pt is already active with her program but they have not been able to find her recently.  She will come by on Monday to visit pt and they can resume sservices upon discharge. Winferd Humphrey, MSW, LCSW Clinical Social Worker 10/24/2017 3:19 PM

## 2017-10-25 MED ORDER — NICOTINE POLACRILEX 2 MG MT GUM: 2.0000 mg | CHEWING_GUM | OROMUCOSAL | 0 refills | Status: DC | PRN

## 2017-10-25 MED ORDER — HYDROXYZINE HCL 25 MG PO TABS: 25.0000 mg | ORAL_TABLET | Freq: Four times a day (QID) | ORAL | 0 refills | Status: DC | PRN

## 2017-10-25 MED ORDER — SERTRALINE HCL 100 MG PO TABS: 100.0000 mg | ORAL_TABLET | Freq: Every day | ORAL | 0 refills | Status: DC

## 2017-10-25 MED ORDER — ARIPIPRAZOLE ER 400 MG IM SRER: 400.0000 mg | INTRAMUSCULAR | 0 refills | Status: DC

## 2017-10-25 MED ORDER — TRAZODONE HCL 150 MG PO TABS: 150.0000 mg | ORAL_TABLET | Freq: Every evening | ORAL | 0 refills | Status: DC | PRN

## 2017-10-25 NOTE — BHH Suicide Risk Assessment (Signed)
[facility] Discharge Suicide Risk Assessment   Principal Problem: Schizoaffective disorder [facility]) Discharge Diagnoses:  Patient Active Problem List   Diagnosis Date Noted  . Schizoaffective disorder ([place]) [F25.9] 07/29/2017  . MDD (major depressive disorder), recurrent episode, severe ([place]) [F33.2] 02/19/2017  . Schizoaffective disorder, bipolar type ([place]) [F25.0] 01/06/2017  . 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 IIH (idiopathic intracranial hypertension) [G93.2] 01/24/2016  . Cannabis use disorder, moderate, dependence ([place]) [F12.20] 08/18/2015  . Schizoaffective disorder, depressive type ([place]) [F25.1] 01/15/2012    Class: Acute    Total Time spent with patient: 45 minutes  Musculoskeletal: Strength & Muscle Tone: within normal limits Gait & Station: normal Patient leans: N/A  Psychiatric Specialty Exam: Review of Systems  Constitutional: Negative.   HENT: Negative.   Eyes: Negative.   Respiratory: Negative.   Cardiovascular: Negative.   Gastrointestinal: Negative.   Genitourinary: Negative.   Musculoskeletal: Negative.   Skin: Negative.   Neurological: Negative.   Endo/Heme/Allergies: Negative.   Psychiatric/Behavioral: Negative for depression, hallucinations, memory loss, substance abuse and suicidal ideas. The patient is not nervous/anxious and does not have insomnia.     Blood pressure (!) 154/85, pulse 90, temperature 98.4 F (36.9 C), temperature source Oral, resp. rate 16, height 5' 5.75" (1.67 m), weight 119.7 kg (264 lb).Body mass index is 42.94 kg/m.  General Appearance: In hospital clothing, pleasant, engaging well. Appropriate behavior.   Eye Contact::  Good  Speech:  Spontaneous, normal prosody. Normal tone and rate.   Volume:  Normal  Mood:  Euthymic  Affect:  Appropriate and Full Range  Thought Process:  Linear  Orientation:  Full (Time, Place, and Person)  Thought Content:  No delusional theme. No preoccupation with violent thoughts. No negative  ruminations. No obsession.  No hallucination in any modality.   Suicidal Thoughts:  No  Homicidal Thoughts:  No  Memory:  Immediate;   Good Recent;   Good Remote;   Good  Judgement:  Good  Insight:  Good  Psychomotor Activity:  Normal  Concentration:  Good  Recall:  Good  Fund of Knowledge:Good  Language: Good  Akathisia:  Negative  Handed:    AIMS (if indicated):     Assets:  Communication Skills Desire for Improvement Housing Intimacy Resilience  Sleep:  Number of Hours: 6.75  Cognition: WNL  ADL's:  Fair   Clinical Assessment::   41y.o AAF, married ,lives with her husband. Background history ofSchizoaffective disorder. Presented to the [facility] emergency services. Her husband called after he found patient with belt round her neck. Patient has been off her antipsychotic depot medication for a month. She has been having visual and auditory hallucinations. She denies any stressors. Routine labssignificant for anaemia, thrombocytosis and elevated bilirubin.Toxicology is negative, UDS negative. No alcohol. Patient was given her Abilify Depot at the ER.  Seen today. Says she is back to her normal self. Her husband has been in communication with her. Says he wants her home soon.  Reports that she is in good spirits. Not feeling depressed. Reports normal energy and interest. Has been maintaining normal biological functions. She is able to think clearly. She is able to focus on task. Her thoughts are not crowded or racing. No evidence of mania. No hallucination in any modality. She is not making any delusional statement. No passivity of will/thought. She is fully in touch with reality. No thoughts of suicide. No thoughts of homicide. No violent thoughts. No overwhelming anxiety. No access to weapons. I spoke to her husband Erlene Quan  on [phone]. Says he feels she is back to her normal self. He wants to come pick her up today. No concerns about her safety or the safety of  others.  Nursing staff reports that patient has been appropriate on the unit. Patient has been interacting well with peers. No behavioral issues. Patient has not voiced any suicidal thoughts. Patient has not been observed to be internally stimulated. Patient has been adherent with treatment recommendations. Patient has been tolerating their medication well.   Patient was discussed at team. Team members feels that patient is back to her baseline level of function. Team agrees with plan to discharge patient today.   Demographic Factors:  Low socioeconomic status  Loss Factors: NA  Historical Factors: NA  Risk Reduction Factors:   Sense of responsibility to family, Living with another person, especially a relative, Positive social support, Positive therapeutic relationship and Positive coping skills or problem solving skills  Continued Clinical Symptoms:  As above   Cognitive Features That Contribute To Risk:  None    Suicide Risk:  Minimal: No identifiable suicidal ideation.  Patient is not having any thoughts of suicide at this time. Modifiable risk factors targeted during this admission includes psychosis. Demographical and historical risk factors cannot be modified. Patient is now engaging well. Patient is reliable and is future oriented. We have buffered patient's support structures. At this point, patient is at low risk of suicide. Patient is aware of the effects of psychoactive substances on decision making process. Patient has been provided with emergency contacts. Patient acknowledges to use resources provided if unforseen circumstances changes their current risk stratification.      Plan Of Care/Follow-up recommendations:  1. Continue current psychotropic medications 2. Mental health and addiction follow up as arranged.  3. Discharge in care of her family 4. Provided limited quantity of prescriptions   Artist Beach, MD 10/25/2017, 10:21 AM

## 2017-10-25 NOTE — BHH Suicide Risk Assessment (Signed)
BHH INPATIENT:  Family/Significant Other Suicide Prevention Education  Suicide Prevention Education:  Contact Attempts: Sarah [name], husband, [phone] has been identified by the patient as the family member/significant other with whom the patient will be residing, and identified as the person(s) who will aid the patient in the event of a mental health crisis.  With written consent from the patient, two attempts were made to provide suicide prevention education, prior to and/or following the patient's discharge.  We were unsuccessful in providing suicide prevention education.  A suicide education pamphlet was given to the patient to share with family/significant other.  Date and time of first attempt:   10/23/17, 1235   Date and time of second attempt:  10/25/2017  /12:46 PM   Sarah [name] 10/25/2017, 12:45 PM

## 2017-10-25 NOTE — Discharge Summary (Signed)
Physician Discharge Summary Note  Patient:  Sarah [name] is an 42 y.o., female MRN:  [id number] DOB:  [birth year] Patient phone:  [phone] (home)  Patient address:   [address] [place] [address],  Total Time spent with patient: 45 minutes  Date of Admission:  10/21/2017 Date of Discharge: 10/25/2017  Reason for Admission:   42 y.o AAF, married ,lives with her husband. Background history of Schizoaffective disorder. Presented to the ER via emergency services. Her husband called after he found patient with belt round her neck. Patient has been off her antipsychotic depot medication for a month. She has been having visual and auditory hallucinations. She denies any stressors. Routine labs significant for anaemia, thrombocytosis and elevated bilirubin.  Toxicology is negative,  UDS negative. No alcohol. Patient was given her Abilify Depot at the ER.   At interview, patient tells me that she has been feeling better since she got her medication. Says she had been seeing ghosts at home. They commanded her to end her life. Patient says she has not seen them lately and she has not heard them lately. Says she had been struggling to sleep. She has felt very paranoid. Say she is feeling good now. She slept well last night. Says she does not have any intention to die. Suicide attempt was in context of command hallucination. No command to harm other. No recent substance use. Says she typically uses THC but has not had any in over a month. Denies use of any other substance. No synthetic substance use. . No evidence of mania. No overwhelming anxiety. No evidence of PTSD. No current thoughts of harming others. No thoughts of violence. No access to weapons.     Principal Problem: Schizoaffective disorder [facility]) Discharge Diagnoses: Patient Active Problem List   Diagnosis Date Noted  . Cannabis use disorder, moderate, dependence ([place]) [F12.20] 08/18/2015    Priority: Medium  . Schizoaffective  disorder ([place]) [F25.9] 07/29/2017  . MDD (major depressive disorder), recurrent episode, severe ([place]) [F33.2] 02/19/2017  . Schizoaffective disorder, bipolar type ([place]) [F25.0] 01/06/2017  . 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 IIH (idiopathic intracranial hypertension) [G93.2] 01/24/2016  . Schizoaffective disorder, depressive type ([place]) [F25.1] 01/15/2012    Class: Acute    Past Psychiatric History: see H&P  Past Medical History:  Past Medical History:  Diagnosis Date  . Anemia   . Anxiety   . Depression   . Elevated bilirubin    slight elevation at 1.3  . Hypertension   . Hypokalemia   . Intracranial hypertension   . Migraines   . Mild intellectual disability   . Schizoaffective disorder [facility])     Past Surgical History:  Procedure Laterality Date  . NO PAST SURGERIES     Family History:  Family History  Problem Relation Age of Onset  . Schizophrenia Mother   . Hypertension Mother   . Anesthesia problems Neg Hx   . Malignant hyperthermia Neg Hx   . Pseudochol deficiency Neg Hx   . Hypotension Neg Hx   . Migraines Neg Hx    Family Psychiatric  History: see H&P Social History:  Social History   Substance and Sexual Activity  Alcohol Use No   Comment: denied all drug/alcohol use     Social History   Substance and Sexual Activity  Drug Use Yes  . Types: Marijuana   Comment: denied all drug/alcohol use    Social History   Socioeconomic History  . Marital status: Single  Spouse name: None  . Number of children: 0  . Years of education: 10  . Highest education level: None  Social Needs  . Financial resource strain: None  . Food insecurity - worry: None  . Food insecurity - inability: None  . Transportation needs - medical: None  . Transportation needs - non-medical: None  Occupational History  . Occupation: Unemployed  Tobacco Use  . Smoking status: Current Every Day Smoker    Packs/day: 1.00    Years: 15.00    Pack years: 15.00     Types: Cigarettes    Last attempt to quit: 01/02/2017    Years since quitting: 0.8  . Smokeless tobacco: Never Used  . Tobacco comment: smoked since 42 yrs old  Substance and Sexual Activity  . Alcohol use: No    Comment: denied all drug/alcohol use  . Drug use: Yes    Types: Marijuana    Comment: denied all drug/alcohol use  . Sexual activity: Yes    Birth control/protection: None  Other Topics Concern  . None  Social History Narrative   Lives with fiance, Erlene Quan   Caffeine use: Soda: 1 soda daily   No coffee   No tea    Hospital Course:   Sarah [name] was admitted for Schizoaffective disorder ([place]) , with crisis management.  Pt was treated discharged with the medications listed below under Medication List.  Medical problems were identified and treated as needed.  Home medications were restarted as appropriate.  Improvement was monitored by observation and Sarah [name] 's daily report of symptom reduction.  Emotional and mental status was monitored by daily self-inventory reports completed by Sarah [name] and clinical staff.         Sarah [name] was evaluated by the treatment team for stability and plans for continued recovery upon discharge. Sarah [name] 's motivation was an integral factor for scheduling further treatment. Employment, transportation, bed availability, health status, family support, and any pending legal issues were also considered during hospital stay. Pt was offered further treatment options upon discharge including but not limited to Residential, Intensive Outpatient, and Outpatient treatment.  Sarah [name] will follow up with the services as listed below under Follow Up Information.     Upon completion of this admission the patient was both mentally and medically stable for discharge denying suicidal/homicidal ideation, auditory/visual/tactile hallucinations, delusional thoughts and paranoia.    Sarah [name] responded well to treatment  with abilify, vistaril, nicotine, zoloft, trazodone without adverse effects. Pt demonstrated improvement without reported or observed adverse effects to the point of stability appropriate for outpatient management. Pertinent labs include: none remarkable. Reviewed CBC, CMP, BAL, and UDS; all unremarkable aside from noted exceptions.    Physical Findings: AIMS: Facial and Oral Movements Muscles of Facial Expression: None, normal Lips and Perioral Area: None, normal Jaw: None, normal Tongue: None, normal,Extremity Movements Upper (arms, wrists, hands, fingers): None, normal Lower (legs, knees, ankles, toes): None, normal, Trunk Movements Neck, shoulders, hips: None, normal, Overall Severity Severity of abnormal movements (highest score from questions above): None, normal Incapacitation due to abnormal movements: None, normal Patient's awareness of abnormal movements (rate only patient's report): No Awareness, Dental Status Current problems with teeth and/or dentures?: No Does patient usually wear dentures?: No  CIWA:    COWS:     Musculoskeletal: Strength & Muscle Tone: within normal limits Gait & Station: normal Patient leans: N/A  Psychiatric Specialty Exam: Physical Exam  Review of Systems  Psychiatric/Behavioral: Positive for depression. Negative for hallucinations, substance abuse and suicidal ideas. The patient is nervous/anxious.   All other systems reviewed and are negative.   Blood pressure (!) 154/85, pulse 90, temperature 98.4 F (36.9 C), temperature source Oral, resp. rate 16, height 5' 5.75" (1.67 m), weight 119.7 kg (264 lb).Body mass index is 42.94 kg/m.  SEE MD PSE WITHIN SRA    Have you used any form of tobacco in the last 30 days? (Cigarettes, Smokeless Tobacco, Cigars, and/or Pipes): Yes  Has this patient used any form of tobacco in the last 30 days? (Cigarettes, Smokeless Tobacco, Cigars, and/or Pipes) Yes, Yes, A prescription for an FDA-approved tobacco  cessation medication was offered at discharge and the patient refused  Blood Alcohol level:  Lab Results  Component Value Date   [facility] <10 10/20/2017   ETH <10 76/16/0737    Metabolic Disorder Labs:  Lab Results  Component Value Date   HGBA1C 5.1 07/31/2017   MPG 99.67 07/31/2017   MPG 108 01/04/2017   Lab Results  Component Value Date   PROLACTIN 17.5 07/31/2017   PROLACTIN 18.7 01/04/2017   Lab Results  Component Value Date   CHOL 179 01/04/2017   TRIG 158 (H) 01/04/2017   HDL 58 01/04/2017   CHOLHDL 3.1 01/04/2017   VLDL 32 01/04/2017   LDLCALC 89 01/04/2017   LDLCALC 72 08/19/2015    See Psychiatric Specialty Exam and Suicide Risk Assessment completed by Attending Physician prior to discharge.  Discharge destination:  Home  Is patient on multiple antipsychotic therapies at discharge:  No   Has Patient had three or more failed trials of antipsychotic monotherapy by history:  No  Recommended Plan for Multiple Antipsychotic Therapies: NA   Allergies as of 10/25/2017      Reactions   Penicillins Anaphylaxis   Has patient had a PCN reaction causing immediate rash, facial/tongue/throat swelling, SOB or lightheadedness with hypotension: throat and face swelling. YES Has patient had a PCN reaction causing severe rash involving mucus membranes or skin necrosis: Yes Has patient had a PCN reaction that required hospitalization Yes Has patient had a PCN reaction occurring within the last 10 years: No If all of the above answers are "NO", then may proceed with Cephalosporin use.   Bee Venom Swelling   Coconut Oil Hives, Swelling   ALLERGIES TO COCONUT      Medication List    TAKE these medications     Indication  amLODipine 10 MG tablet Commonly known as:  NORVASC Take 1 tablet (10 mg total) by mouth daily. For high blood pressure  Indication:  High Blood Pressure Disorder   ARIPiprazole ER 400 MG Srer Inject 400 mg into the muscle every 21 ( twenty-one)  days. Start taking on:  11/11/2017 What changed:    when to take this  additional instructions  Another medication with the same name was removed. Continue taking this medication, and follow the directions you see here.  Indication:  Mood control   hydrochlorothiazide 25 MG tablet Commonly known as:  HYDRODIURIL Take 1 tablet (25 mg total) by mouth daily. For high blood pressure  Indication:  High Blood Pressure Disorder   hydrOXYzine 25 MG tablet Commonly known as:  ATARAX/VISTARIL Take 1 tablet (25 mg total) by mouth every 6 (six) hours as needed for anxiety.  Indication:  Feeling Anxious   nicotine polacrilex 2 MG gum Commonly known as:  NICORETTE Take 1 each (2 mg total) by mouth as needed for smoking  cessation.  Indication:  Nicotine Addiction   sertraline 100 MG tablet Commonly known as:  ZOLOFT Take 1 tablet (100 mg total) by mouth daily. Start taking on:  10/26/2017 What changed:    medication strength  how much to take  Indication:  Major Depressive Disorder   traZODone 150 MG tablet Commonly known as:  DESYREL Take 1 tablet (150 mg total) by mouth at bedtime as needed for sleep.  Indication:  Trouble Sleeping      Follow-up Information    [facility] Follow up.   Why:  Your ACTT Team is trying to get in touch with you.  Weekend Education officer, museum contacted them and gave them your address [place] and phone number [phone].  They will follow up with 3 or more weekly visits starting on 10/27/17, Contact information: [place] [address] [phone]           Follow-up recommendations:  Activity:  As tolerated Diet:  Heart healthy with low sodium  Comments:  Take all medications as prescribed. Keep all follow-up appointments as scheduled.  Do not consume alcohol or use illegal drugs while on prescription medications. Report any adverse effects from your medications to your primary care  provider promptly.  In the event of recurrent symptoms or worsening symptoms, call 911, a crisis hotline, or go to the nearest emergency department for evaluation.    Signed: Benjamine Mola, FNP 10/25/2017, 3:43 PM

## 2017-10-25 NOTE — Progress Notes (Signed)
D pt is prepared for dc  By this Probation officer as dc instructions are reviewed with her.SHe says " yes" in response to writer asking her if she understands and can comply with these instructions. A SHe is given cc of dc paperwork ( SRA , AVS, SSP, and transition record. ). Allbelongings are returned to pt and SW has given pt bus ticket for transportation to her home. Pt is escorted to the bldg and dc'd per MD order.

## 2017-10-25 NOTE — [facility] Group Notes (Signed)
LCSW Group Therapy Note  10/25/2017     10:15-11:15AM  Type of Therapy and Topic:  Group Therapy:  Decisional Balance/Substance Use  Participation Level:  Minimal        Description of Group:  The main focus of today's process group was learning how to use a decisional balance exercise to make a decision about whether to change an unhealthy coping skill, as well as how to use the information gathered in the actual process of planning that change.  Patients listed some of their most frequently utilized unhealthy coping techniques and CSW pointed out the similarities.  Motivational Interviewing was utilized to help patients explore in-depth the perceived benefits and costs of unhealthy coping techniques (with an emphasis on drinking & drugging) as well as the benefits and costs of replacing that with other, healthy coping skills.  A handout was distributed for patients to be able to do this exercise for themselves.     Therapeutic Goals 1. Patient will be able to utilize the decision balance exercise on their own 2. Patient will list coping skills they use to fulfill their needs 3. Patient will identify the differences between healthy and  unhealthy coping skills 4. Patient will verbalize the costs and benefits of drinking/drugging versus making the choice to change 5. Patient will learn how about how using this exercise on an ongoing basis can help to make decisions about whether to make a change, and how everyone is at a different place in that decision-making process.  Summary of Patient Progress: During group, patient expressed she stopped her medications in a healthy uncoping technique.  She spoke [place] in group and seemed confused at times.   Therapeutic Modalities Cognitive Behavioral Therapy Motivational Interviewing  Selmer Dominion, LCSW 10/25/2017, 1:58 PM

## 2017-10-25 NOTE — Progress Notes (Signed)
D Patient is observed standing at the med counter this morning. She calls Probation officer by writer's name. She is flat and blunted but makes direct eye contact. She takes her scheduled medications as planned. A She completed her daily assessment and on this she wrote she deneid SI today and she rated her depression, hopelessness and anxiety " 0/0/0/", respectively. She is observed taking a shower and writer changed her bed linens. Writer offered encouragement to patient re taking responsibility for her ADL's. Pt smiled broadly. Safety cont and therapeutic relationship is fostered.

## 2017-10-25 NOTE — Progress Notes (Signed)
  [facility] Adult Case Management Discharge Plan :  Will you be returning to the same living situation after discharge:  Yes,  with husband and mother-in-law At discharge, do you have transportation home?: Yes,  bus pass provided Do you have the ability to pay for your medications: Yes,  Medicaid  Release of information consent forms completed and turned in to Medical Records.  Patient to Follow up at: Follow-up Information    [facility] Follow up.   Why:  Your ACTT Team is trying to get in touch with you.  Weekend Education officer, museum contacted them and gave them your address [place] and phone number [phone].  They will follow up with 3 or more weekly visits starting on 10/27/17, Contact information: [place] [address] [phone]           Next level of care provider has access to [place] and Suicide Prevention discussed: No.  2 attempts made  Have you used any form of tobacco in the last 30 days? (Cigarettes, Smokeless Tobacco, Cigars, and/or Pipes): Yes  Has patient been referred to the Quitline?: Patient refused referral  Patient has been referred for addiction treatment: N/A  Maretta Los, LCSW 10/25/2017, 12:40 PM

## 2017-10-27 NOTE — Progress Notes (Signed)
CSW spoke with [facility] to confirm that they had the correct address for pt.   [address], [place], [phone].  This was the information they have.  They will reach out to client. Winferd Humphrey, MSW, LCSW Clinical Social Worker 10/27/2017 11:41 AM

## 2018-02-11 ENCOUNTER — Other Ambulatory Visit: Payer: Self-pay

## 2018-02-11 ENCOUNTER — Emergency Department (HOSPITAL_COMMUNITY)
Admission: EM | Admit: 2018-02-11 | Discharge: 2018-02-12 | Disposition: A | Discharge status: Home / Self Care | Payer: Medicaid Other | Attending: Emergency Medicine | Admitting: Emergency Medicine

## 2018-02-11 ENCOUNTER — Encounter (HOSPITAL_COMMUNITY): Payer: Self-pay | Admitting: Emergency Medicine

## 2018-02-11 DIAGNOSIS — F17210 Nicotine dependence, cigarettes, uncomplicated: Secondary | ICD-10-CM | POA: Diagnosis not present

## 2018-02-11 DIAGNOSIS — Z79899 Other long term (current) drug therapy: Secondary | ICD-10-CM | POA: Insufficient documentation

## 2018-02-11 DIAGNOSIS — I10 Essential (primary) hypertension: Secondary | ICD-10-CM | POA: Diagnosis not present

## 2018-02-11 DIAGNOSIS — F251 Schizoaffective disorder, depressive type: Secondary | ICD-10-CM | POA: Diagnosis present

## 2018-02-11 DIAGNOSIS — R45851 Suicidal ideations: Secondary | ICD-10-CM | POA: Diagnosis not present

## 2018-02-11 LAB — RAPID URINE DRUG SCREEN, HOSP PERFORMED
AMPHETAMINES: NOT DETECTED
BARBITURATES: NOT DETECTED
BENZODIAZEPINES: NOT DETECTED
Cocaine: NOT DETECTED
OPIATES: NOT DETECTED
TETRAHYDROCANNABINOL: NOT DETECTED

## 2018-02-11 LAB — COMPREHENSIVE METABOLIC PANEL
ALBUMIN: 3.8 g/dL (ref 3.5–5.0)
ALT: 16 U/L (ref 14–54)
ANION GAP: 9 (ref 5–15)
AST: 19 U/L (ref 15–41)
Alkaline Phosphatase: 71 U/L (ref 38–126)
BUN: 17 mg/dL (ref 6–20)
CALCIUM: 9 mg/dL (ref 8.9–10.3)
CHLORIDE: 109 mmol/L (ref 101–111)
CO2: 24 mmol/L (ref 22–32)
Creatinine, Ser: 0.68 mg/dL (ref 0.44–1.00)
GFR calc Af Amer: >60 mL/min (ref >60)
GFR calc non Af Amer: >60 mL/min (ref >60)
GLUCOSE: 101 mg/dL — AB (ref 65–99)
Potassium: 3.3 mmol/L — ABNORMAL LOW (ref 3.5–5.1)
SODIUM: 142 mmol/L (ref 135–145)
Total Bilirubin: 0.8 mg/dL (ref 0.3–1.2)
Total Protein: 7.9 g/dL (ref 6.5–8.1)

## 2018-02-11 LAB — CBC
HEMATOCRIT: 32.8 % — AB (ref 36.0–46.0)
HEMOGLOBIN: 10 g/dL — AB (ref 12.0–15.0)
MCH: 24.8 pg — ABNORMAL LOW (ref 26.0–34.0)
MCHC: 30.5 g/dL (ref 30.0–36.0)
MCV: 81.4 fL (ref 78.0–100.0)
Platelets: 458 10*3/uL — ABNORMAL HIGH (ref 150–400)
RBC: 4.03 MIL/uL (ref 3.87–5.11)
RDW: 18.1 % — ABNORMAL HIGH (ref 11.5–15.5)
WBC: 7.4 10*3/uL (ref 4.0–10.5)

## 2018-02-11 LAB — I-STAT BETA HCG BLOOD, ED (MC, WL, AP ONLY): I-stat hCG, quantitative: <5 m[IU]/mL (ref <5)

## 2018-02-11 LAB — ACETAMINOPHEN LEVEL

## 2018-02-11 LAB — SALICYLATE LEVEL

## 2018-02-11 LAB — ETHANOL: Alcohol, Ethyl (B): <10 mg/dL (ref <10)

## 2018-02-11 NOTE — ED Triage Notes (Signed)
Pt reports being depressed for the last 2 days and having thoughts of overdosing.

## 2018-02-11 NOTE — BH Assessment (Addendum)
Assessment Note  Sarah [name] is an 42 y.o. female, who presents voluntary and unaccompanied to [facility]. Clinician asked the pt, "what brought you to the hospital?" Pt replied, "suicidal thoughts for two days, depression, thoughts of overdosing on pills." Pt reported she has been depressed for two days. Pt reported, not knowing the triggers of her depression. Pt reported, for the past two days she heard voices telling her to hurt herself. Pt reported, having access to knives. Clinician observed during the latter end of the assessment pt began responding, "mmmm hmm," to questions. Pt denies, HI and self-injurious behaviors.  Pt denies abuse. Pt reported, smoking a pack of cigarettes, daily. Pt denies being linked to OPT resources (medication management and/or counseling.) Pt has previous inpatient admissions.   Pt presents quiet/awake in scrubs with logical/coherent speech. Pt's eye contact was fair. Pt's mood was sad. Pt's affect was appropriate to circumstance. Pt's thought process was coherent/relevant. Pt's judgement was unimpaired. Pt was oriented x4. Pt's concentration was normal. Pt's insight and impulse control are fair. Pt reported, if discharged from [facility] she could not contract for safety. Pt reported, if impatent treatment was recommended she would sign-in voluntarily.   Diagnosis: F33.3 Major Depressive Disorder, recurrent with psychotic features.  Past Medical History:  Past Medical History:  Diagnosis Date  . Anemia   . Anxiety   . Depression   . Elevated bilirubin    slight elevation at 1.3  . Hypertension   . Hypokalemia   . Intracranial hypertension   . Migraines   . Mild intellectual disability   . Schizoaffective disorder [facility])     Past Surgical History:  Procedure Laterality Date  . NO PAST SURGERIES      Family History:  Family History  Problem Relation Age of Onset  . Schizophrenia Mother   . Hypertension Mother   . Anesthesia problems Neg Hx   . Malignant  hyperthermia Neg Hx   . Pseudochol deficiency Neg Hx   . Hypotension Neg Hx   . Migraines Neg Hx     Social History:  reports that she has been smoking cigarettes.  She has a 15.00 pack-year smoking history. She has never used smokeless tobacco. She reports that she has current or past drug history. Drug: Marijuana. She reports that she does not drink alcohol.  Additional Social History:  Alcohol / Drug Use Pain Medications: See MAR Prescriptions: See MAR Over the Counter: See MAR History of alcohol / drug use?: Yes Substance #1 Name of Substance 1: Cigarettes. 1 - Age of First Use: UTA 1 - Amount (size/oz): Pt reported, smoking a pack of cigarettes, daily.  1 - Frequency: Daily.  1 - Duration: Ongoing.  1 - Last Use / Amount: Pt reported, daily.   CIWA: CIWA-Ar BP: 140/69 Pulse Rate: 67 COWS:    Allergies:  Allergies  Allergen Reactions  . Penicillins Anaphylaxis    Has patient had a PCN reaction causing immediate rash, facial/tongue/throat swelling, SOB or lightheadedness with hypotension: throat and face swelling. YES Has patient had a PCN reaction causing severe rash involving mucus membranes or skin necrosis: Yes Has patient had a PCN reaction that required hospitalization Yes Has patient had a PCN reaction occurring within the last 10 years: No If all of the above answers are "NO", then may proceed with Cephalosporin use.   . Bee Venom Swelling  . Coconut Oil Hives and Swelling    ALLERGIES TO COCONUT    Home Medications:  (Not in a  hospital admission)  OB/GYN Status:  Patient's last menstrual period was 01/05/2018.  General Assessment Data [facility]: [facility] TTS Assessment: In system Is this a Tele or Face-to-Face Assessment?: Face-to-Face Is this an Initial Assessment or a Re-assessment for this encounter?: Initial Assessment Marital status: Married [place] name: Herrod Is patient pregnant?: No Pregnancy Status: No Living Arrangements:  Spouse/significant other Can pt return to current living arrangement?: Yes Admission Status: Voluntary Is patient capable of signing voluntary admission?: Yes Referral Source: Self/Family/Friend Insurance type: Medicaid.     Crisis Care Plan Living Arrangements: Spouse/significant other Legal Guardian: Other:(Self.) Name of Psychiatrist: NA Name of Therapist: NA  Education Status Is patient currently in school?: No Is the patient employed, unemployed or receiving disability?: Unemployed  Risk to self with the past 6 months Suicidal Ideation: Yes-Currently Present Has patient been a risk to self within the past 6 months prior to admission? : Yes Suicidal Intent: Yes-Currently Present Has patient had any suicidal intent within the past 6 months prior to admission? : Yes Is patient at risk for suicide?: Yes Suicidal Plan?: Yes-Currently Present Has patient had any suicidal plan within the past 6 months prior to admission? : Yes Specify Current Suicidal Plan: Pt reported, overdosing on pills.  Access to Means: Yes Specify Access to Suicidal Means: Pt reported, having access to pills.  What has been your use of drugs/alcohol within the last 12 months?: Negative.  Previous Attempts/Gestures: Yes How many times?: 3 Other Self Harm Risks: Pt denies.  Triggers for Past Attempts: Unknown Intentional Self Injurious Behavior: None(Pt denies. ) Family Suicide History: No Recent stressful life event(s): (Depression.) Persecutory voices/beliefs?: Yes Depression: Yes Depression Symptoms: Feeling angry/irritable, Feeling worthless/self pity, Guilt, Loss of interest in usual pleasures, Fatigue, Isolating Substance abuse history and/or treatment for substance abuse?: No Suicide prevention information given to non-admitted patients: Not applicable  Risk to Others within the past 6 months Homicidal Ideation: No(Pt denies. ) Does patient have any lifetime risk of violence toward others beyond  the six months prior to admission? : No(Pt denies. ) Thoughts of Harm to Others: No(Pt denies. ) Current Homicidal Intent: No(Pt denies. ) Current Homicidal Plan: No(Pt denies. ) Access to Homicidal Means: No(Pt denies. ) Identified Victim: NA History of harm to others?: No Assessment of Violence: None Noted Violent Behavior Description: NA Does patient have access to weapons?: Yes (Comment)(Knives.) Criminal Charges Pending?: No Does patient have a court date: No Is patient on probation?: No  Psychosis Hallucinations: Auditory, With command Delusions: None noted  Mental Status Report Appearance/Hygiene: In scrubs Eye Contact: Fair Motor Activity: Unremarkable Speech: Logical/coherent Level of Consciousness: Quiet/awake Mood: Sad Affect: Appropriate to circumstance Anxiety Level: None Thought Processes: Coherent, Relevant Judgement: Unimpaired Orientation: Person, Place, Time, Situation Obsessive Compulsive Thoughts/Behaviors: None  Cognitive Functioning Concentration: Normal Memory: Recent Intact Is patient IDD: No Is patient DD?: No Insight: Fair Impulse Control: Fair Appetite: Good Sleep: No Change Total Hours of Sleep: 8 Vegetative Symptoms: None  ADLScreening [facility] Assessment Services) Patient's cognitive ability adequate to safely complete daily activities?: Yes Patient able to express need for assistance with ADLs?: Yes Independently performs ADLs?: Yes (appropriate for developmental age)  Prior Inpatient Therapy Prior Inpatient Therapy: Yes Prior Therapy Dates: 02/18/2018 Prior Therapy Facilty/Provider(s): [facility].  Reason for Treatment: Overdosing on Tylenol.   Prior Outpatient Therapy Prior Outpatient Therapy: No Does patient have an ACCT team?: No Does patient have Intensive In-House Services?  : No Does patient have [facility] services? : No  Does patient have P4CC services?: No  ADL Screening (condition at time of admission) Patient's cognitive  ability adequate to safely complete daily activities?: Yes Is the patient deaf or have difficulty hearing?: No Does the patient have difficulty seeing, even when wearing glasses/contacts?: No Does the patient have difficulty concentrating, remembering, or making decisions?: No Patient able to express need for assistance with ADLs?: Yes Does the patient have difficulty dressing or bathing?: No Independently performs ADLs?: Yes (appropriate for developmental age) Does the patient have difficulty walking or climbing stairs?: No Weakness of Legs: None Weakness of Arms/Hands: None  Home Assistive Devices/Equipment Home Assistive Devices/Equipment: None    Abuse/Neglect Assessment (Assessment to be complete while patient is alone) Abuse/Neglect Assessment Can Be Completed: Yes Physical Abuse: Denies(Pt denies. ) Verbal Abuse: Denies(Pt denies. ) Sexual Abuse: Denies(Pt denies. ) Exploitation of patient/patient's resources: Denies(Pt denies. ) Self-Neglect: Denies(Pt denies. )     Advance Directives (For Healthcare) Does Patient Have a Medical Advance Directive?: No    Additional Information 1:1 In Past 12 Months?: No CIRT Risk: No Elopement Risk: No Does patient have medical clearance?: Yes    Disposition: Patriciaann Clan, PA recommends inpatient treatment. Per Herbert Spires, [facility] no appropriate beds available. Discussed with Darryll Capers, RN. TTS to seek placement.   Disposition Initial Assessment Completed for this Encounter: Yes Disposition of Patient: (inpatient treatment. ) Patient refused recommended treatment: No Mode of transportation if patient is discharged?: N/A  On Site Evaluation by: Vertell Novak, MS, LPC, CRC. Reviewed with Physician:  Jillyn Ledger, [place] and Patriciaann Clan, PA.  Vertell Novak 02/12/2018 12:24 AM   Vertell Novak, MS, [facility], [place] Triage Specialist [phone]

## 2018-02-11 NOTE — ED Notes (Signed)
Pt presents with SI, plan to overdose on pills.  Depression for the past 2 days.  Pt reports she overdosed on pills x 6 mos ago.  Denies HI, Hearing voices telling her to harm herself.  Feeling hopeless.  Pt reports diagnosed with Bipolar DO, Schizophrenia.  A&O x 3, no distress noted, calm & cooperative.  Monitoring for safety, Q 15 min checks in effect.

## 2018-02-11 NOTE — ED Notes (Signed)
Pt have one bag of belongings in locker #27

## 2018-02-11 NOTE — ED Notes (Signed)
Bed: WA27 Expected date:  Expected time:  Means of arrival:  Comments: [place]

## 2018-02-11 NOTE — ED Provider Notes (Signed)
[facility] DEPT Provider Note   CSN: [id number] Arrival date & time: 02/11/18  1951     History   Chief Complaint Chief Complaint  Patient presents with  . Suicidal    HPI Sarah [name] is a 42 y.o. female presenting for evaluation of depression and suicidal thoughts.  Patient states for the past 2 days, she has had worsening depression and suicidal thoughts.  She states she just woke up like this 2 days ago.  There is no trigger.  She states she has thought about overdosing on pills that she has access to, but has not done so.  She denies attempts at self-harm in the past several days.  She denies HI.  She reports seeing ghosts and hearing voices that tell her to kill herself, these are not new for her.  She has been taking all her medications as prescribed.  No changes recently.  She has a history of high blood pressure, no other medical problems.  Denies recent fevers, chills, chest pain, shortness of breath, nausea, vomiting, abdominal pain, urinary symptoms, normal bowel movements.  She smokes cigarettes daily, denies alcohol or drug use.  She states she has been sleeping poorly, no change in appetite or energy levels.  HPI  Past Medical History:  Diagnosis Date  . Anemia   . Anxiety   . Depression   . Elevated bilirubin    slight elevation at 1.3  . Hypertension   . Hypokalemia   . Intracranial hypertension   . Migraines   . Mild intellectual disability   . Schizoaffective disorder [facility])     Patient Active Problem List   Diagnosis Date Noted  . Schizoaffective disorder ([place]) 07/29/2017  . MDD (major depressive disorder), recurrent episode, severe ([place]) 02/19/2017  . Schizoaffective disorder, bipolar type ([place]) 01/06/2017  . Mild intellectual disability 01/06/2017  . IIH (idiopathic intracranial hypertension) 01/24/2016  . Cannabis use disorder, moderate, dependence ([place]) 08/18/2015  . Schizoaffective disorder, depressive type ([place])  01/15/2012    Class: Acute    Past Surgical History:  Procedure Laterality Date  . NO PAST SURGERIES       OB History   None      Home Medications    Prior to Admission medications   Medication Sig Start Date End Date Taking? Authorizing Provider  amLODipine (NORVASC) 10 MG tablet Take 1 tablet (10 mg total) by mouth daily. For high blood pressure 08/01/17  Yes Nwoko, Agnes I, NP  ARIPiprazole ER 400 MG SRER Inject 400 mg into the muscle every 21 ( twenty-one) days. 11/11/17  Yes Withrow, Elyse Jarvis, FNP  hydrochlorothiazide (HYDRODIURIL) 25 MG tablet Take 1 tablet (25 mg total) by mouth daily. For high blood pressure 08/02/17  Yes Nwoko, Herbert Pun I, NP  hydrOXYzine (ATARAX/VISTARIL) 25 MG tablet Take 1 tablet (25 mg total) by mouth every 6 (six) hours as needed for anxiety. 10/25/17  Yes Withrow, Elyse Jarvis, FNP  lamoTRIgine (LAMICTAL) 25 MG tablet Take 25 mg by mouth 2 (two) times daily.   Yes [provider]  sertraline (ZOLOFT) 100 MG tablet Take 1 tablet (100 mg total) by mouth daily. 10/26/17  Yes Withrow, Elyse Jarvis, FNP  traZODone (DESYREL) 150 MG tablet Take 1 tablet (150 mg total) by mouth at bedtime as needed for sleep. 10/25/17  Yes Withrow, Elyse Jarvis, FNP  nicotine polacrilex (NICORETTE) 2 MG gum Take 1 each (2 mg total) by mouth as needed for smoking cessation. Patient not taking: Reported on  02/11/2018 10/25/17   Withrow, Elyse Jarvis, FNP    Family History Family History  Problem Relation Age of Onset  . Schizophrenia Mother   . Hypertension Mother   . Anesthesia problems Neg Hx   . Malignant hyperthermia Neg Hx   . Pseudochol deficiency Neg Hx   . Hypotension Neg Hx   . Migraines Neg Hx     Social History Social History   Tobacco Use  . Smoking status: Current Every Day Smoker    Packs/day: 1.00    Years: 15.00    Pack years: 15.00    Types: Cigarettes    Last attempt to quit: 01/02/2017    Years since quitting: 1.1  . Smokeless tobacco: Never Used  . Tobacco comment:  smoked since 42 yrs old  Substance Use Topics  . Alcohol use: No    Comment: denied all drug/alcohol use  . Drug use: Yes    Types: Marijuana    Comment: denied all drug/alcohol use     Allergies   Penicillins; Bee venom; and Coconut oil   Review of Systems Review of Systems  Psychiatric/Behavioral: Positive for dysphoric mood and suicidal ideas.  All other systems reviewed and are negative.    Physical Exam Updated Vital Signs BP (!) 164/104 (BP Location: Left Arm)   Pulse (!) 58   Temp 98.5 F (36.9 C) (Oral)   Resp 17   Ht 5\' 7"  (1.702 m)   Wt 120.2 kg (265 lb)   LMP 01/05/2018   SpO2 100%   BMI 41.50 kg/m   Physical Exam  Constitutional: She is oriented to person, place, and time. She appears well-developed and well-nourished. No distress.  HENT:  Head: Normocephalic and atraumatic.  Eyes: EOM are normal.  Neck: Normal range of motion.  Cardiovascular: Normal rate, regular rhythm and intact distal pulses.  Pulmonary/Chest: Effort normal and breath sounds normal. No respiratory distress. She has no wheezes.  Abdominal: She exhibits no distension.  Musculoskeletal: Normal range of motion.  Neurological: She is alert and oriented to person, place, and time.  Skin: Skin is warm. No rash noted.  Psychiatric: She is withdrawn and actively hallucinating. She exhibits a depressed mood. She expresses suicidal ideation. She expresses no homicidal ideation. She expresses suicidal plans. She expresses no homicidal plans.  Patient calm and cooperative.  Behavior is withdrawn.  Reports suicidal thoughts with plan.  Denies HI.  Visual and auditory hallucinations, which are not new for patient.  She is not obviously responding to internal stimuli.  Nursing note and vitals reviewed.    ED Treatments / Results  Labs (all labs ordered are listed, but only abnormal results are displayed) Labs Reviewed  COMPREHENSIVE METABOLIC PANEL - Abnormal; Notable for the following  components:      Result Value   Potassium 3.3 (*)    Glucose, Bld 101 (*)    All other components within normal limits  ACETAMINOPHEN LEVEL - Abnormal; Notable for the following components:   Acetaminophen (Tylenol), Serum <10 (*)    All other components within normal limits  CBC - Abnormal; Notable for the following components:   Hemoglobin 10.0 (*)    HCT 32.8 (*)    MCH 24.8 (*)    RDW 18.1 (*)    Platelets 458 (*)    All other components within normal limits  ETHANOL  SALICYLATE LEVEL  RAPID URINE DRUG SCREEN, HOSP PERFORMED  I-STAT BETA HCG BLOOD, ED ([facility], [facility], [facility] ONLY)    EKG None  Radiology No results found.  Procedures Procedures (including critical care time)  Medications Ordered in ED Medications - No data to display   Initial Impression / Assessment and Plan / ED Course  I have reviewed the triage vital signs and the nursing notes.  Pertinent labs & imaging results that were available during my care of the patient were reviewed by me and considered in my medical decision making (see chart for details).     Patient presenting for evaluation of suicidal thoughts and depressed mood.  Physical exam shows patient without acute abnormalities.  Labs reassuring, at baseline.  Hemoglobin stable.  At this time, patient is medically cleared for TTS eval.   Final Clinical Impressions(s) / ED Diagnoses   Final diagnoses:  None    ED Discharge Orders    None       Franchot Heidelberg, PA-C 02/11/18 [id number]    Tegeler, Gwenyth Allegra, MD 02/11/18 2352

## 2018-02-11 NOTE — ED Notes (Signed)
Bed: [facility] Expected date:  Expected time:  Means of arrival:  Comments: [place], rm 27

## 2018-02-11 NOTE — ED Notes (Signed)
Pt not in room.

## 2018-02-12 MED ORDER — TRAZODONE HCL 50 MG PO TABS: 150.0000 mg | ORAL_TABLET | Freq: Every evening | ORAL | Status: DC | PRN

## 2018-02-12 MED ORDER — HYDROXYZINE HCL 25 MG PO TABS: 25.0000 mg | ORAL_TABLET | Freq: Four times a day (QID) | ORAL | Status: DC | PRN

## 2018-02-12 MED ORDER — SERTRALINE HCL 50 MG PO TABS
100.0000 mg | ORAL_TABLET | Freq: Every day | ORAL | Status: DC
  Administered 2018-02-12: 100 mg via ORAL
  Filled 2018-02-12: qty 2

## 2018-02-12 MED ORDER — LAMOTRIGINE 25 MG PO TABS
25.0000 mg | ORAL_TABLET | Freq: Two times a day (BID) | ORAL | Status: DC
  Administered 2018-02-12: 25 mg via ORAL
  Filled 2018-02-12: qty 1

## 2018-02-12 MED ORDER — AMLODIPINE BESYLATE 5 MG PO TABS
10.0000 mg | ORAL_TABLET | Freq: Every day | ORAL | Status: DC
  Administered 2018-02-12: 10 mg via ORAL
  Filled 2018-02-12: qty 2

## 2018-02-12 MED ORDER — HYDROCHLOROTHIAZIDE 25 MG PO TABS
25.0000 mg | ORAL_TABLET | Freq: Every day | ORAL | Status: DC
  Administered 2018-02-12: 25 mg via ORAL
  Filled 2018-02-12: qty 1

## 2018-02-12 NOTE — BHH Suicide Risk Assessment (Signed)
Suicide Risk Assessment  Discharge Assessment   [facility] Discharge Suicide Risk Assessment   Principal Problem: Schizoaffective disorder, depressive type [facility]) Discharge Diagnoses:  Patient Active Problem List   Diagnosis Date Noted  . Schizoaffective disorder, depressive type ([place]) [F25.1] 01/15/2012    Priority: High    Class: Acute  . Schizoaffective disorder ([place]) [F25.9] 07/29/2017  . MDD (major depressive disorder), recurrent episode, severe ([place]) [F33.2] 02/19/2017  . Schizoaffective disorder, bipolar type ([place]) [F25.0] 01/06/2017  . 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 IIH (idiopathic intracranial hypertension) [G93.2] 01/24/2016  . Cannabis use disorder, moderate, dependence ([place]) [F12.20] 08/18/2015    Total Time spent with patient: 45 minutes  Musculoskeletal: Strength & Muscle Tone: within normal limits Gait & Station: normal Patient leans: N/A  Psychiatric Specialty Exam:   Blood pressure (!) 158/95, pulse 60, temperature 98.2 F (36.8 C), resp. rate 20, height 5\' 7"  (1.702 m), weight 120.2 kg (265 lb), last menstrual period 01/05/2018, SpO2 98 %.Body mass index is 41.5 kg/m.  General Appearance: Casual  Eye Contact::  Good  Speech:  Normal Rate409  Volume:  Normal  Mood:  Depressed, mild  Affect:  Non-Congruent  Thought Process:  Coherent and Descriptions of Associations: Intact  Orientation:  Full (Time, Place, and Person)  Thought Content:  WDL and Logical  Suicidal Thoughts:  No  Homicidal Thoughts:  No  Memory:  Immediate;   Fair Recent;   Fair Remote;   Fair  Judgement:  Good  Insight:  Fair  Psychomotor Activity:  Normal  Concentration:  Good  Recall:  [place] of Knowledge:Fair  Language: Good  Akathisia:  No  Handed:  Right  AIMS (if indicated):     Assets:  Housing Intimacy Leisure Time Physical Health Resilience Social Support  Sleep:     Cognition: Impaired,  Mild  ADL's:  Intact   Mental Status Per Nursing  Assessment::   On Admission:   42 yo female who presented to the ED after "waking up with suicidal ideations."  On assessment, she denies suicidal/homicidal ideations, hallucinations, and substance abuse.  She lives with her husband and has an ACT team, Teacher, music.  L[place]ell appears to like being at [facility].  She smiles on assessment with reports she is not taking all of her medications.  Notified her ACT team, stable for discharge.  Demographic Factors:  NA  Loss Factors: NA  Historical Factors: NA  Risk Reduction Factors:   Sense of responsibility to family, Living with another person, especially a relative, Positive social support and Positive therapeutic relationship  Continued Clinical Symptoms:  Depression, mild  Cognitive Features That Contribute To Risk:  None    Suicide Risk:  Minimal: No identifiable suicidal ideation.  Patients presenting with no risk factors but with morbid ruminations; may be classified as minimal risk based on the severity of the depressive symptoms    Plan Of Care/Follow-up recommendations:  Activity:  as tolerated Diet:  heart healthy diet  Maverik Foot, NP 02/12/2018, 10:55 AM

## 2018-02-12 NOTE — Discharge Instructions (Signed)
For your behavioral health needs, you are advised to continue treatment with the [facility] ACT Team: ° °     [facility] °     [address] °     , [place] [address] °     [phone] °

## 2018-02-12 NOTE — BH Assessment (Signed)
[facility] Assessment Progress Note  Per Buford Dresser, DO, this pt does not require psychiatric hospitalization at this time.  Pt is to be discharged from [facility] with recommendation to continue treatment with the [facility] ACT Team.  This has been included in pt's discharge instructions.  At 10:45 I called [facility] and spoke to [place].  She agrees to call be back with an approximate arrival time.  Pt's nurse, Sharyn Lull, has been notified.  Jalene Mullet, [place] Triage Specialist [phone]

## 2018-02-12 NOTE — ED Notes (Signed)
Patient resting in bed with eyes closed; no distress noted at this time. Respirations regular and unlabored.

## 2018-02-12 NOTE — ED Notes (Signed)
Patient with dull, flat affect and is endorsing depression. When asked about having suicidal thoughts, she states "Yeah, I'm still having those thoughts, but I ain't gonna hurt myself or nothing". Pt verbally contracts for safety. No distress noted at this time.

## 2018-03-08 ENCOUNTER — Other Ambulatory Visit: Payer: Self-pay

## 2018-03-08 ENCOUNTER — Emergency Department: Payer: Medicaid Other

## 2018-03-08 ENCOUNTER — Emergency Department
Admission: EM | Admit: 2018-03-08 | Discharge: 2018-03-08 | Disposition: A | Discharge status: Home / Self Care | Payer: Medicaid Other | Attending: Emergency Medicine | Admitting: Emergency Medicine

## 2018-03-08 DIAGNOSIS — F17210 Nicotine dependence, cigarettes, uncomplicated: Secondary | ICD-10-CM | POA: Diagnosis not present

## 2018-03-08 DIAGNOSIS — R61 Generalized hyperhidrosis: Secondary | ICD-10-CM | POA: Insufficient documentation

## 2018-03-08 DIAGNOSIS — F1210 Cannabis abuse, uncomplicated: Secondary | ICD-10-CM | POA: Diagnosis not present

## 2018-03-08 DIAGNOSIS — R42 Dizziness and giddiness: Secondary | ICD-10-CM | POA: Insufficient documentation

## 2018-03-08 DIAGNOSIS — I10 Essential (primary) hypertension: Secondary | ICD-10-CM | POA: Diagnosis not present

## 2018-03-08 DIAGNOSIS — R1011 Right upper quadrant pain: Secondary | ICD-10-CM | POA: Diagnosis not present

## 2018-03-08 DIAGNOSIS — Z79899 Other long term (current) drug therapy: Secondary | ICD-10-CM | POA: Insufficient documentation

## 2018-03-08 DIAGNOSIS — R55 Syncope and collapse: Secondary | ICD-10-CM | POA: Diagnosis present

## 2018-03-08 DIAGNOSIS — N390 Urinary tract infection, site not specified: Secondary | ICD-10-CM | POA: Insufficient documentation

## 2018-03-08 LAB — URINALYSIS, COMPLETE (UACMP) WITH MICROSCOPIC
Glucose, UA: NEGATIVE mg/dL
Ketones, ur: 5 mg/dL — AB
Leukocytes, UA: NEGATIVE
Nitrite: NEGATIVE
PH: 5 (ref 5.0–8.0)
Protein, ur: 30 mg/dL — AB
SPECIFIC GRAVITY, URINE: 1.031 — AB (ref 1.005–1.030)

## 2018-03-08 LAB — BASIC METABOLIC PANEL
Anion gap: 9 (ref 5–15)
BUN: 17 mg/dL (ref 6–20)
CO2: 24 mmol/L (ref 22–32)
CREATININE: 0.9 mg/dL (ref 0.44–1.00)
Calcium: 8.7 mg/dL — ABNORMAL LOW (ref 8.9–10.3)
Chloride: 108 mmol/L (ref 101–111)
GFR calc Af Amer: >60 mL/min (ref >60)
GFR calc non Af Amer: >60 mL/min (ref >60)
GLUCOSE: 91 mg/dL (ref 65–99)
POTASSIUM: 3.5 mmol/L (ref 3.5–5.1)
SODIUM: 141 mmol/L (ref 135–145)

## 2018-03-08 LAB — HEPATIC FUNCTION PANEL
ALBUMIN: 3.9 g/dL (ref 3.5–5.0)
ALK PHOS: 79 U/L (ref 38–126)
ALT: 13 U/L — AB (ref 14–54)
AST: 22 U/L (ref 15–41)
Bilirubin, Direct: <0.1 mg/dL — ABNORMAL LOW (ref 0.1–0.5)
TOTAL PROTEIN: 7.7 g/dL (ref 6.5–8.1)
Total Bilirubin: 0.6 mg/dL (ref 0.3–1.2)

## 2018-03-08 LAB — CBC
HEMATOCRIT: 28.3 % — AB (ref 35.0–47.0)
Hemoglobin: 9.4 g/dL — ABNORMAL LOW (ref 12.0–16.0)
MCH: 26.4 pg (ref 26.0–34.0)
MCHC: 33.1 g/dL (ref 32.0–36.0)
MCV: 79.7 fL — AB (ref 80.0–100.0)
PLATELETS: 423 10*3/uL (ref 150–440)
RBC: 3.55 MIL/uL — ABNORMAL LOW (ref 3.80–5.20)
RDW: 20.3 % — AB (ref 11.5–14.5)
WBC: 8.1 10*3/uL (ref 3.6–11.0)

## 2018-03-08 LAB — LIPASE, BLOOD: Lipase: 25 U/L (ref 11–51)

## 2018-03-08 LAB — POCT PREGNANCY, URINE: PREG TEST UR: NEGATIVE

## 2018-03-08 LAB — TROPONIN I

## 2018-03-08 MED ORDER — SODIUM CHLORIDE 0.9 % IV BOLUS
1000.0000 mL | Freq: Once | INTRAVENOUS | Status: AC
  Administered 2018-03-08: 1000 mL via INTRAVENOUS

## 2018-03-08 MED ORDER — FOSFOMYCIN TROMETHAMINE 3 G PO PACK
3.0000 g | PACK | Freq: Once | ORAL | Status: AC
  Administered 2018-03-08: 3 g via ORAL
  Filled 2018-03-08 (×2): qty 3

## 2018-03-08 NOTE — ED Provider Notes (Signed)
[facility] Emergency Department Provider Note  Time seen: 4:03 PM  I have reviewed the triage vital signs and the nursing notes.   HISTORY  Chief Complaint Loss of Consciousness    HPI Sarah [name] is a 42 y.o. female with a past medical history of anemia, hypertension, schizophrenia, presents to the emergency department after syncopal episode.  According to the patient for the past 3 days she has been passing out about 1 time each day.  States before 3 days ago she has never passed out.  Denies any chest pain or trouble breathing.  Patient states she will begin feeling lightheaded and sometimes will feel sweaty and then she passes out.  Denies any palpitations.  Does states she has been expensing right upper quadrant abdominal pain for the past 2 to 3 days.  Denies any fever, no nausea or vomiting.  No diarrhea.  No dysuria.  Describes her right upper quadrant pain is mild dull pain currently.   Past Medical History:  Diagnosis Date  . Anemia   . Anxiety   . Depression   . Elevated bilirubin    slight elevation at 1.3  . Hypertension   . Hypokalemia   . Intracranial hypertension   . Migraines   . Mild intellectual disability   . Schizoaffective disorder [facility])     Patient Active Problem List   Diagnosis Date Noted  . Schizoaffective disorder ([place]) 07/29/2017  . MDD (major depressive disorder), recurrent episode, severe ([place]) 02/19/2017  . Schizoaffective disorder, bipolar type ([place]) 01/06/2017  . Mild intellectual disability 01/06/2017  . IIH (idiopathic intracranial hypertension) 01/24/2016  . Cannabis use disorder, moderate, dependence ([place]) 08/18/2015  . Schizoaffective disorder, depressive type ([place]) 01/15/2012    Class: Acute    Past Surgical History:  Procedure Laterality Date  . NO PAST SURGERIES      Prior to Admission medications   Medication Sig Start Date End Date Taking? Authorizing Provider  amLODipine (NORVASC) 10 MG tablet  Take 1 tablet (10 mg total) by mouth daily. For high blood pressure 08/01/17   Nwoko, Herbert Pun I, NP  ARIPiprazole ER 400 MG SRER Inject 400 mg into the muscle every 21 ( twenty-one) days. 11/11/17   Withrow, Elyse Jarvis, FNP  hydrochlorothiazide (HYDRODIURIL) 25 MG tablet Take 1 tablet (25 mg total) by mouth daily. For high blood pressure 08/02/17   Lindell Spar I, NP  hydrOXYzine (ATARAX/VISTARIL) 25 MG tablet Take 1 tablet (25 mg total) by mouth every 6 (six) hours as needed for anxiety. 10/25/17   Withrow, Elyse Jarvis, FNP  lamoTRIgine (LAMICTAL) 25 MG tablet Take 25 mg by mouth 2 (two) times daily.    [provider]  nicotine polacrilex (NICORETTE) 2 MG gum Take 1 each (2 mg total) by mouth as needed for smoking cessation. Patient not taking: Reported on 02/11/2018 10/25/17   Benjamine Mola, FNP  sertraline (ZOLOFT) 100 MG tablet Take 1 tablet (100 mg total) by mouth daily. 10/26/17   Withrow, Elyse Jarvis, FNP  traZODone (DESYREL) 150 MG tablet Take 1 tablet (150 mg total) by mouth at bedtime as needed for sleep. 10/25/17   Withrow, Elyse Jarvis, FNP    Allergies  Allergen Reactions  . Penicillins Anaphylaxis    Has patient had a PCN reaction causing immediate rash, facial/tongue/throat swelling, SOB or lightheadedness with hypotension: throat and face swelling. YES Has patient had a PCN reaction causing severe rash involving mucus membranes or skin necrosis: Yes Has patient had a  PCN reaction that required hospitalization Yes Has patient had a PCN reaction occurring within the last 10 years: No If all of the above answers are "NO", then may proceed with Cephalosporin use.   . Bee Venom Swelling  . Coconut Oil Hives and Swelling    ALLERGIES TO COCONUT    Family History  Problem Relation Age of Onset  . Schizophrenia Mother   . Hypertension Mother   . Anesthesia problems Neg Hx   . Malignant hyperthermia Neg Hx   . Pseudochol deficiency Neg Hx   . Hypotension Neg Hx   . Migraines Neg Hx      Social History Social History   Tobacco Use  . Smoking status: Current Every Day Smoker    Packs/day: 1.00    Years: 15.00    Pack years: 15.00    Types: Cigarettes    Last attempt to quit: 01/02/2017    Years since quitting: 1.1  . Smokeless tobacco: Never Used  . Tobacco comment: smoked since 42 yrs old  Substance Use Topics  . Alcohol use: No    Comment: denied all drug/alcohol use  . Drug use: Yes    Types: Marijuana    Comment: denied all drug/alcohol use    Review of Systems Constitutional: Negative for fever.  Positive for loss of consciousness x3 over the past 3 days. Eyes: Negative for visual complaints ENT: Negative for recent illness/congestion Cardiovascular: Negative for chest pain. Respiratory: Negative for shortness of breath. Gastrointestinal: Mild right upper quadrant pain.  Negative for nausea vomiting or diarrhea. Genitourinary: Negative for urinary compaints Musculoskeletal: Negative for musculoskeletal complaints Skin: Negative for skin complaints  Neurological: Negative for headache All other ROS negative  ____________________________________________   PHYSICAL EXAM:  VITAL SIGNS: ED Triage Vitals  Enc Vitals Group     BP 03/08/18 1421 120/61     Pulse Rate 03/08/18 1421 77     Resp 03/08/18 1421 16     Temp 03/08/18 1421 98.3 F (36.8 C)     Temp Source 03/08/18 1421 Oral     SpO2 03/08/18 1421 99 %     Weight 03/08/18 1426 235 lb (106.6 kg)     Height 03/08/18 1426 5\' 7"  (1.702 m)     Head Circumference --      Peak Flow --      Pain Score 03/08/18 1426 9     Pain Loc --      Pain Edu? --      Excl. in [place]? --    Constitutional: Alert and oriented. Well appearing and in no distress. Eyes: Normal exam ENT   Head: Normocephalic and atraumatic.   Mouth/Throat: Mucous membranes are moist. Cardiovascular: Normal rate, regular rhythm. No murmur Respiratory: Normal respiratory effort without tachypnea nor retractions. Breath  sounds are clear Gastrointestinal: Soft, patient states mild pain to right upper quadrant palpation, no reaction to palpation. Musculoskeletal: Nontender with normal range of motion in all extremities. Neurologic:  Normal speech and language. No gross focal neurologic deficits Skin:  Skin is warm, dry and intact.  Psychiatric: Mood and affect are normal.  ____________________________________________    EKG  EKG reviewed and interpreted by myself shows normal sinus rhythm at 69 beats with a narrow QRS, mild right axis deviation, largely normal intervals.  Patient does have inferolateral T wave inversions.  In comparing to prior EKGs her lateral T wave inversions are old, however there appears to be new morphology to the inferior T waves.  ____________________________________________  RADIOLOGY  Ultrasound consistent with fatty liver.  ____________________________________________   INITIAL IMPRESSION / ASSESSMENT AND PLAN / ED COURSE  Pertinent labs & imaging results that were available during my care of the patient were reviewed by me and considered in my medical decision making (see chart for details).  Patient presents to the emergency department after syncopal episodes.  Patient states over the past 3 days she has been experiencing syncopal episodes.  Differential would include ACS, dehydration, vasovagal, orthostatic, arrhythmia.  We will check labs, IV hydrate and closely monitor.  Patient is EKG does show what appear to be new changes to her inferior leads compared to 06/25/2017.  I have added on a troponin to the patient's blood work as a Geographical information systems officer.  Patient denies any chest pain now or at any point.  Denies any trouble breathing.  Patient does have old lateral T wave inversions, and somewhat abnormal appearing inferior leads and her prior EKG 06/25/2017.  I reviewed the patient's records she has been admitted multiple times for psychiatric purposes but do not see any recent or  relevant medical admissions.  Ultrasound consistent with fatty liver.  Labs are largely within normal limits.  Possible urinary tract infection.  We will dose with a one-time dose of fosfomycin and a urine culture has been sent.  Patient does have EKG inversions which are possibly new compared to 2018.  Her troponin is reassuringly negative.  Patient has no chest pain complaints.  We will have the patient follow-up with cardiology.  I instructed the patient to call tomorrow morning for the next available appointment.  Patient agreeable to this plan of care.  I also discussed return precautions for development of any chest pain or trouble breathing as well as any further syncopal events.  Patient agreeable to plan of care.  ____________________________________________   FINAL CLINICAL IMPRESSION(S) / ED DIAGNOSES  Syncope   Harvest Dark, MD 03/08/18 [phone]

## 2018-03-08 NOTE — ED Triage Notes (Signed)
Pt c/o having syncopal episodes for the past 3 days, states just once each day while outside walking. States her husband witnessed the events and states she was out of it for about 63minutes each time. Pt c/o RUQ pain states for the past 2 days, denies N/V/D.Marland Kitchen

## 2018-03-08 NOTE — Discharge Instructions (Addendum)
Please call the number provided for cardiology tomorrow morning to arrange next available appointment for further evaluation possible Holter monitoring and for an abnormal appearing EKG.  Return to the emergency department for any further episodes of passing out, or if you develop any chest pain, trouble breathing, or any other symptom personally concerning to yourself.  Your work-up today does show a mild urinary tract infection you have been treated with a one-time dose of antibiotics, no further antibiotics are necessary.

## 2018-03-08 NOTE — ED Notes (Signed)
First Nurse Note:  Presents to the ED via EMS for right sided hip pain and frequent falls due to syncopal episodes.  Patient states she has been weak and fainting for the past 3 days.

## 2018-03-10 LAB — URINE CULTURE

## 2018-03-21 ENCOUNTER — Encounter (HOSPITAL_COMMUNITY): Payer: Self-pay | Admitting: *Deleted

## 2018-03-21 ENCOUNTER — Emergency Department (HOSPITAL_COMMUNITY)
Admission: EM | Admit: 2018-03-21 | Discharge: 2018-03-21 | Disposition: A | Discharge status: Other Acute Inpatient Hospital | Payer: Medicaid Other | Attending: Emergency Medicine | Admitting: Emergency Medicine

## 2018-03-21 ENCOUNTER — Other Ambulatory Visit: Payer: Self-pay

## 2018-03-21 ENCOUNTER — Inpatient Hospital Stay (HOSPITAL_COMMUNITY)
Admission: AD | Admit: 2018-03-21 | Discharge: 2018-03-26 | DRG: 885 | Disposition: A | Discharge status: Home / Self Care | Payer: Medicaid Other | Source: Intra-hospital | Attending: Psychiatry | Admitting: Psychiatry

## 2018-03-21 DIAGNOSIS — F419 Anxiety disorder, unspecified: Secondary | ICD-10-CM | POA: Diagnosis present

## 2018-03-21 DIAGNOSIS — Z91018 Allergy to other foods: Secondary | ICD-10-CM | POA: Diagnosis not present

## 2018-03-21 DIAGNOSIS — Z880 Allergy status to penicillin: Secondary | ICD-10-CM

## 2018-03-21 DIAGNOSIS — Z79899 Other long term (current) drug therapy: Secondary | ICD-10-CM | POA: Insufficient documentation

## 2018-03-21 DIAGNOSIS — Z818 Family history of other mental and behavioral disorders: Secondary | ICD-10-CM | POA: Diagnosis not present

## 2018-03-21 DIAGNOSIS — Z91138 Patient's unintentional underdosing of medication regimen for other reason: Secondary | ICD-10-CM | POA: Diagnosis not present

## 2018-03-21 DIAGNOSIS — F251 Schizoaffective disorder, depressive type: Principal | ICD-10-CM | POA: Diagnosis present

## 2018-03-21 DIAGNOSIS — T43596A Underdosing of other antipsychotics and neuroleptics, initial encounter: Secondary | ICD-10-CM | POA: Diagnosis present

## 2018-03-21 DIAGNOSIS — I10 Essential (primary) hypertension: Secondary | ICD-10-CM | POA: Diagnosis present

## 2018-03-21 DIAGNOSIS — F17210 Nicotine dependence, cigarettes, uncomplicated: Secondary | ICD-10-CM | POA: Insufficient documentation

## 2018-03-21 DIAGNOSIS — F70 Mild intellectual disabilities: Secondary | ICD-10-CM | POA: Diagnosis present

## 2018-03-21 DIAGNOSIS — R440 Auditory hallucinations: Secondary | ICD-10-CM | POA: Insufficient documentation

## 2018-03-21 DIAGNOSIS — G4700 Insomnia, unspecified: Secondary | ICD-10-CM | POA: Diagnosis present

## 2018-03-21 DIAGNOSIS — Z915 Personal history of self-harm: Secondary | ICD-10-CM | POA: Diagnosis not present

## 2018-03-21 DIAGNOSIS — F79 Unspecified intellectual disabilities: Secondary | ICD-10-CM | POA: Insufficient documentation

## 2018-03-21 DIAGNOSIS — Z8249 Family history of ischemic heart disease and other diseases of the circulatory system: Secondary | ICD-10-CM | POA: Diagnosis not present

## 2018-03-21 DIAGNOSIS — Z87892 Personal history of anaphylaxis: Secondary | ICD-10-CM | POA: Diagnosis not present

## 2018-03-21 DIAGNOSIS — T1491XA Suicide attempt, initial encounter: Secondary | ICD-10-CM | POA: Diagnosis not present

## 2018-03-21 DIAGNOSIS — T50902A Poisoning by unspecified drugs, medicaments and biological substances, intentional self-harm, initial encounter: Secondary | ICD-10-CM | POA: Diagnosis not present

## 2018-03-21 DIAGNOSIS — F209 Schizophrenia, unspecified: Secondary | ICD-10-CM

## 2018-03-21 DIAGNOSIS — R45851 Suicidal ideations: Secondary | ICD-10-CM | POA: Insufficient documentation

## 2018-03-21 DIAGNOSIS — T39312A Poisoning by propionic acid derivatives, intentional self-harm, initial encounter: Secondary | ICD-10-CM | POA: Diagnosis not present

## 2018-03-21 DIAGNOSIS — T391X2A Poisoning by 4-Aminophenol derivatives, intentional self-harm, initial encounter: Secondary | ICD-10-CM | POA: Diagnosis not present

## 2018-03-21 DIAGNOSIS — Z91030 Bee allergy status: Secondary | ICD-10-CM

## 2018-03-21 LAB — COMPREHENSIVE METABOLIC PANEL
ALK PHOS: 73 U/L (ref 38–126)
ALT: 16 U/L (ref 14–54)
AST: 19 U/L (ref 15–41)
Albumin: 3.8 g/dL (ref 3.5–5.0)
Anion gap: 8 (ref 5–15)
BILIRUBIN TOTAL: 0.5 mg/dL (ref 0.3–1.2)
BUN: 13 mg/dL (ref 6–20)
CALCIUM: 8.3 mg/dL — AB (ref 8.9–10.3)
CO2: 26 mmol/L (ref 22–32)
Chloride: 109 mmol/L (ref 101–111)
Creatinine, Ser: 0.65 mg/dL (ref 0.44–1.00)
GFR calc Af Amer: >60 mL/min (ref >60)
Glucose, Bld: 83 mg/dL (ref 65–99)
POTASSIUM: 3.7 mmol/L (ref 3.5–5.1)
Sodium: 143 mmol/L (ref 135–145)
TOTAL PROTEIN: 7.2 g/dL (ref 6.5–8.1)

## 2018-03-21 LAB — CBC
HCT: 31.9 % — ABNORMAL LOW (ref 36.0–46.0)
Hemoglobin: 9.9 g/dL — ABNORMAL LOW (ref 12.0–15.0)
MCH: 25.6 pg — AB (ref 26.0–34.0)
MCHC: 31 g/dL (ref 30.0–36.0)
MCV: 82.6 fL (ref 78.0–100.0)
PLATELETS: 467 10*3/uL — AB (ref 150–400)
RBC: 3.86 MIL/uL — ABNORMAL LOW (ref 3.87–5.11)
RDW: 18.9 % — AB (ref 11.5–15.5)
WBC: 8.4 10*3/uL (ref 4.0–10.5)

## 2018-03-21 LAB — ACETAMINOPHEN LEVEL: Acetaminophen (Tylenol), Serum: <10 ug/mL — ABNORMAL LOW (ref 10–30)

## 2018-03-21 LAB — RAPID URINE DRUG SCREEN, HOSP PERFORMED
Amphetamines: NOT DETECTED
BENZODIAZEPINES: NOT DETECTED
Cocaine: NOT DETECTED
Opiates: NOT DETECTED
Tetrahydrocannabinol: NOT DETECTED

## 2018-03-21 LAB — I-STAT BETA HCG BLOOD, ED (MC, WL, AP ONLY)

## 2018-03-21 LAB — SALICYLATE LEVEL: Salicylate Lvl: <7 mg/dL (ref 2.8–30.0)

## 2018-03-21 LAB — ETHANOL

## 2018-03-21 MED ORDER — SERTRALINE HCL 50 MG PO TABS
100.0000 mg | ORAL_TABLET | Freq: Every day | ORAL | Status: DC
  Administered 2018-03-21: 100 mg via ORAL
  Filled 2018-03-21: qty 2

## 2018-03-21 MED ORDER — HYDROXYZINE HCL 25 MG PO TABS
25.0000 mg | ORAL_TABLET | Freq: Four times a day (QID) | ORAL | Status: DC | PRN
  Administered 2018-03-21: 25 mg via ORAL
  Filled 2018-03-21: qty 1

## 2018-03-21 MED ORDER — TRAZODONE HCL 50 MG PO TABS: 150.0000 mg | ORAL_TABLET | Freq: Every evening | ORAL | Status: DC | PRN

## 2018-03-21 MED ORDER — LAMOTRIGINE 25 MG PO TABS
25.0000 mg | ORAL_TABLET | Freq: Two times a day (BID) | ORAL | Status: DC
  Administered 2018-03-21: 25 mg via ORAL
  Filled 2018-03-21: qty 1

## 2018-03-21 MED ORDER — HYDROCHLOROTHIAZIDE 25 MG PO TABS
25.0000 mg | ORAL_TABLET | Freq: Every day | ORAL | Status: DC
  Administered 2018-03-22 – 2018-03-26 (×5): 25 mg via ORAL
  Filled 2018-03-21 (×6): qty 1

## 2018-03-21 MED ORDER — MAGNESIUM HYDROXIDE 400 MG/5ML PO SUSP: 30.0000 mL | Freq: Every day | ORAL | Status: DC | PRN

## 2018-03-21 MED ORDER — AMLODIPINE BESYLATE 10 MG PO TABS
10.0000 mg | ORAL_TABLET | Freq: Every day | ORAL | Status: DC
  Administered 2018-03-22 – 2018-03-26 (×5): 10 mg via ORAL
  Filled 2018-03-21 (×6): qty 1

## 2018-03-21 MED ORDER — HYDROXYZINE HCL 25 MG PO TABS
25.0000 mg | ORAL_TABLET | Freq: Four times a day (QID) | ORAL | Status: DC | PRN
  Administered 2018-03-22 – 2018-03-26 (×5): 25 mg via ORAL
  Filled 2018-03-21 (×4): qty 1

## 2018-03-21 MED ORDER — ACETAMINOPHEN 325 MG PO TABS: 650.0000 mg | ORAL_TABLET | Freq: Four times a day (QID) | ORAL | Status: DC | PRN

## 2018-03-21 MED ORDER — TRAZODONE HCL 150 MG PO TABS
150.0000 mg | ORAL_TABLET | Freq: Every evening | ORAL | Status: DC | PRN
  Administered 2018-03-21: 150 mg via ORAL
  Filled 2018-03-21: qty 1

## 2018-03-21 MED ORDER — ALUM & MAG HYDROXIDE-SIMETH 200-200-20 MG/5ML PO SUSP: 30.0000 mL | ORAL | Status: DC | PRN

## 2018-03-21 MED ORDER — HYDROCHLOROTHIAZIDE 25 MG PO TABS
25.0000 mg | ORAL_TABLET | Freq: Every day | ORAL | Status: DC
  Administered 2018-03-21: 25 mg via ORAL
  Filled 2018-03-21: qty 1

## 2018-03-21 MED ORDER — SERTRALINE HCL 100 MG PO TABS
100.0000 mg | ORAL_TABLET | Freq: Every day | ORAL | Status: DC
  Administered 2018-03-22 – 2018-03-26 (×5): 100 mg via ORAL
  Filled 2018-03-21 (×6): qty 1

## 2018-03-21 MED ORDER — AMLODIPINE BESYLATE 5 MG PO TABS
10.0000 mg | ORAL_TABLET | Freq: Every day | ORAL | Status: DC
  Administered 2018-03-21: 10 mg via ORAL
  Filled 2018-03-21 (×2): qty 2

## 2018-03-21 MED ORDER — LAMOTRIGINE 25 MG PO TABS
25.0000 mg | ORAL_TABLET | Freq: Two times a day (BID) | ORAL | Status: DC
  Administered 2018-03-21 – 2018-03-22 (×2): 25 mg via ORAL
  Filled 2018-03-21 (×7): qty 1

## 2018-03-21 NOTE — ED Notes (Signed)
Poison control has cleared patient per Rose.  If labs are not within normal limits, please call back.

## 2018-03-21 NOTE — Tx Team (Signed)
Initial Treatment Plan 03/21/2018 5:58 PM Sarah [name] HKU:[id number]    PATIENT STRESSORS: Health problems   PATIENT STRENGTHS: Ability for insight General fund of knowledge   PATIENT IDENTIFIED PROBLEMS: Depression                      DISCHARGE CRITERIA:  Need for constant or close observation no longer present  PRELIMINARY DISCHARGE PLAN: Return to previous living arrangement  PATIENT/FAMILY INVOLVEMENT: This treatment plan has been presented to and reviewed with the patient, [name], and/or family member.  The patient and family have been given the opportunity to ask questions and make suggestions.  Ventura Sellers, RN 03/21/2018, 5:58 PM

## 2018-03-21 NOTE — ED Triage Notes (Signed)
Pt states she took ~15 different pain medications in an attempt to kill herself yesterday morning. Pt states she took Tylenol, Advil, and some prescription pain medication. Pt states she vomited a couple of hours after taking the medication. Pt states she is hearing voices telling her to kill herself.

## 2018-03-21 NOTE — Progress Notes (Signed)
Patient ID: Sarah [name], female   DOB: December 29, 1975, 42 y.o.   MRN: [id number]    42 year old black female admitted after reported to [facility] reporting that she overdosed on Advil and Tylenol. At time of admission pt reported that she did not know what she took, but believes it was OTC pain medication. Pt reported that at present she was negative SI/HI, but knew she needed help with her depression. Pt reported that she was depressed but did not identify what her stressors were. Pt reported that her depression was a 9, her hopelessness was a 0, and her anxiety was a 9. Pt reported that she was also positive for AH/VH. Pt reported that she takes blood pressure medications, Zoloft, Abilify injection which she had May 15th. Pt reported that she just needed help. No other issues or concerns noted.

## 2018-03-21 NOTE — BH Assessment (Signed)
[place] Assessment Progress Note Case was staffed with Reita Cliche DNP who recommended patient be admitted inpatient as appropriate bed placement is investigated

## 2018-03-21 NOTE — ED Notes (Signed)
Bed: [facility] Expected date:  Expected time:  Means of arrival:  Comments: 

## 2018-03-21 NOTE — ED Notes (Signed)
[facility] nurse was contacted as well as Betsy Pries

## 2018-03-21 NOTE — ED Notes (Signed)
Food tray given to patient 

## 2018-03-21 NOTE — ED Notes (Signed)
Per Cyndia Bent, RN at [facility].  Request MD give pt BP medication and then monitor her for one  Hour before transferring her over to [facility]

## 2018-03-21 NOTE — ED Notes (Signed)
Poison control was released the case per Kalman Shan, RN

## 2018-03-21 NOTE — ED Notes (Signed)
Pt was transported to [facility] via [employer]

## 2018-03-21 NOTE — ED Provider Notes (Signed)
[place] DEPT Provider Note   CSN: [id number] Arrival date & time: 03/21/18  3810     History   Chief Complaint Chief Complaint  Patient presents with  . Suicide Attempt  . Hallucinations    HPI Sarah [name] is a 42 y.o. female.  42 yo F with a chief complaint of suicidal ideation.  The patient took multiple medications yesterday and attempt to take her life.  She was unable to keep them down.  She is continued to be suicidal.  She says the voices in her head are telling her herself.  She told me that she has a history of depression and she is on Abilify.  States compliance with medication.  Unable to tell me of a recent stressor causing her to be suicidal.  She denies chest pain abdominal pain vomiting diarrhea fevers chills.  The history is provided by the patient.  Illness  This is a new problem. The current episode started yesterday. The problem occurs constantly. The problem has not changed since onset.Pertinent negatives include no chest pain, no headaches and no shortness of breath. Nothing aggravates the symptoms. Nothing relieves the symptoms. She has tried nothing for the symptoms. The treatment provided no relief.    Past Medical History:  Diagnosis Date  . Anemia   . Anxiety   . Depression   . Elevated bilirubin    slight elevation at 1.3  . Hypertension   . Hypokalemia   . Intracranial hypertension   . Migraines   . Mild intellectual disability   . Schizoaffective disorder [facility])     Patient Active Problem List   Diagnosis Date Noted  . Schizoaffective disorder ([place]) 07/29/2017  . MDD (major depressive disorder), recurrent episode, severe ([place]) 02/19/2017  . Schizoaffective disorder, bipolar type ([place]) 01/06/2017  . Mild intellectual disability 01/06/2017  . IIH (idiopathic intracranial hypertension) 01/24/2016  . Cannabis use disorder, moderate, dependence ([place]) 08/18/2015  . Schizoaffective disorder, depressive  type ([place]) 01/15/2012    Class: Acute    Past Surgical History:  Procedure Laterality Date  . NO PAST SURGERIES       OB History   None      Home Medications    Prior to Admission medications   Medication Sig Start Date End Date Taking? Authorizing Provider  amLODipine (NORVASC) 10 MG tablet Take 1 tablet (10 mg total) by mouth daily. For high blood pressure 08/01/17   Nwoko, Herbert Pun I, NP  ARIPiprazole ER 400 MG SRER Inject 400 mg into the muscle every 21 ( twenty-one) days. 11/11/17   Withrow, Elyse Jarvis, FNP  hydrochlorothiazide (HYDRODIURIL) 25 MG tablet Take 1 tablet (25 mg total) by mouth daily. For high blood pressure 08/02/17   Lindell Spar I, NP  hydrOXYzine (ATARAX/VISTARIL) 25 MG tablet Take 1 tablet (25 mg total) by mouth every 6 (six) hours as needed for anxiety. 10/25/17   Withrow, Elyse Jarvis, FNP  lamoTRIgine (LAMICTAL) 25 MG tablet Take 25 mg by mouth 2 (two) times daily.    [provider]  nicotine polacrilex (NICORETTE) 2 MG gum Take 1 each (2 mg total) by mouth as needed for smoking cessation. Patient not taking: Reported on 02/11/2018 10/25/17   Benjamine Mola, FNP  sertraline (ZOLOFT) 100 MG tablet Take 1 tablet (100 mg total) by mouth daily. 10/26/17   Withrow, Elyse Jarvis, FNP  traZODone (DESYREL) 150 MG tablet Take 1 tablet (150 mg total) by mouth at bedtime as needed for sleep. 10/25/17  Withrow, Elyse Jarvis, FNP    Family History Family History  Problem Relation Age of Onset  . Schizophrenia Mother   . Hypertension Mother   . Anesthesia problems Neg Hx   . Malignant hyperthermia Neg Hx   . Pseudochol deficiency Neg Hx   . Hypotension Neg Hx   . Migraines Neg Hx     Social History Social History   Tobacco Use  . Smoking status: Current Every Day Smoker    Packs/day: 1.00    Years: 15.00    Pack years: 15.00    Types: Cigarettes    Last attempt to quit: 01/02/2017    Years since quitting: 1.2  . Smokeless tobacco: Never Used  . Tobacco comment: smoked  since 42 yrs old  Substance Use Topics  . Alcohol use: No    Comment: denied all drug/alcohol use  . Drug use: Yes    Types: Marijuana    Comment: denied all drug/alcohol use     Allergies   Penicillins; Bee venom; and Coconut oil   Review of Systems Review of Systems  Constitutional: Negative for chills and fever.  HENT: Negative for congestion and rhinorrhea.   Eyes: Negative for redness and visual disturbance.  Respiratory: Negative for shortness of breath and wheezing.   Cardiovascular: Negative for chest pain and palpitations.  Gastrointestinal: Negative for nausea and vomiting.  Genitourinary: Negative for dysuria and urgency.  Musculoskeletal: Negative for arthralgias and myalgias.  Skin: Negative for pallor and wound.  Neurological: Negative for dizziness and headaches.     Physical Exam Updated Vital Signs BP (!) 179/79 (BP Location: Right Arm)   Pulse 60   Temp 98.1 F (36.7 C) (Oral)   Resp 18   LMP 03/07/2018   SpO2 100%   Physical Exam  Constitutional: She is oriented to person, place, and time. She appears well-developed and well-nourished. No distress.  HENT:  Head: Normocephalic and atraumatic.  Eyes: Pupils are equal, round, and reactive to light. EOM are normal.  Neck: Normal range of motion. Neck supple.  Cardiovascular: Normal rate and regular rhythm. Exam reveals no gallop and no friction rub.  No murmur heard. Pulmonary/Chest: Effort normal. She has no wheezes. She has no rales.  Abdominal: Soft. She exhibits no distension. There is no tenderness.  Musculoskeletal: She exhibits no edema or tenderness.  Neurological: She is alert and oriented to person, place, and time.  Skin: Skin is warm and dry. She is not diaphoretic.  Psychiatric: She has a normal mood and affect. Her behavior is normal.  Nursing note and vitals reviewed.    ED Treatments / Results  Labs (all labs ordered are listed, but only abnormal results are displayed) Labs  Reviewed  COMPREHENSIVE METABOLIC PANEL - Abnormal; Notable for the following components:      Result Value   Calcium 8.3 (*)    All other components within normal limits  ACETAMINOPHEN LEVEL - Abnormal; Notable for the following components:   Acetaminophen (Tylenol), Serum <10 (*)    All other components within normal limits  CBC - Abnormal; Notable for the following components:   RBC 3.86 (*)    Hemoglobin 9.9 (*)    HCT 31.9 (*)    MCH 25.6 (*)    RDW 18.9 (*)    Platelets 467 (*)    All other components within normal limits  RAPID URINE DRUG SCREEN, HOSP PERFORMED - Abnormal; Notable for the following components:   Barbiturates   (*)  Value: Result not available. Reagent lot number recalled by manufacturer.   All other components within normal limits  ETHANOL  SALICYLATE LEVEL  I-STAT BETA HCG BLOOD, ED ([facility], [facility], [facility] ONLY)    EKG None  Radiology No results found.  Procedures Procedures (including critical care time)  Medications Ordered in ED Medications - No data to display   Initial Impression / Assessment and Plan / ED Course  I have reviewed the triage vital signs and the nursing notes.  Pertinent labs & imaging results that were available during my care of the patient were reviewed by me and considered in my medical decision making (see chart for details).     42 yo F with a significant past medical history of schizophrenia came in with a chief complaint of suicidal ideation.  The patient states that she is hearing voices that are telling her to kill herself.  She tried to take a bunch of medicines yesterday but was unable to keep them down.  Woke up this morning and was persistently suicidal and so she called 911.  Since the ingestion was yesterday and it does not sound that she kept any medications down I feel she is medically clear.  Will discuss with poison control either way.  Poison control recommended Tylenol and salicylate levels if they are unremarkable  then they feel nothing further to do for a poisoning standpoint.  Labs reviewed Tylenol and salicylate are negative.  Hemoglobin is 9.9 which is at her baseline.  UDS negative.   TTS evaluation.  TTS accepts.    The patients results and plan were reviewed and discussed.   Any x-rays performed were independently reviewed by myself.   Differential diagnosis were considered with the presenting HPI.  Medications - No data to display  Vitals:   03/21/18 0726 03/21/18 1230  BP: (!) 155/88 (!) 179/79  Pulse: 77 60  Resp: 18 18  Temp: 98.2 F (36.8 C) 98.1 F (36.7 C)  TempSrc: Oral Oral  SpO2: 100% 100%    Final diagnoses:  Suicidal ideation  Schizophrenia, unspecified type [facility])       Final Clinical Impressions(s) / ED Diagnoses   Final diagnoses:  Suicidal ideation  Schizophrenia, unspecified type [facility])    ED Discharge Orders    None       Deno Etienne, DO 03/21/18 1403

## 2018-03-21 NOTE — BH Assessment (Signed)
Assessment Note  Sarah [name] is an 42 y.o. female that presents this date with S/I after a attempted overdose on OTC medications yesterday. Patient continues to voice thoughts of self harm at the time of assessment and cannot contract for safety. Patient is observed to be drowsy and speaks in a low soft voice as she interacts with this Probation officer. Patient reports she took a unknown amount of OTC medications yesterday to include: Tylenol, Advil and numerous pain medications. Patient is very vague in reference to what medications she actually consumed or time frame. Patient reports three previous attempts at self harm and per notes was last seen by this provider on 02/11/18 when she presented with similar symptoms associated with S/I. Patient cannot identify any immediate stressors that prompted this incident but reports increased depressive symptoms to include: feeling hopeless and "just wanting to give up." Patient is currently receiving services from [facility] who assists with medication management for patient's Schizoaffective disorder. Patient also reports increased AVH over the last week stating she sees "people she doesn't know" and hears their voices that tell her to kill herself. Patient is vague in reference to [place] symptoms and her AVH. Patient does report current medication compliance but feels her medications are not working as indicated. Patient reports she last met with her provider two weeks ago to receive medications. Patient denies any recent medication changes. Patient cannot contact for safety at this time and is requesting assistance to assist with stabilization. Patient's UDS was negative and BAL less than five. Per notes, patient states she took numerous pain medications in an attempt to kill herself yesterday morning. Patient states she took Tylenol, Advil, and some prescription pain medication. Patient states she vomited a couple of hours after taking the medication. Patient states she is  hearing voices telling her to kill herself. Case was staffed with Reita Cliche DNP who recommended patient be admitted inpatient as appropriate bed placement is investigated.  Diagnosis: F25.1 Schizoaffective disorder depressive type.   Past Medical History:  Past Medical History:  Diagnosis Date  . Anemia   . Anxiety   . Depression   . Elevated bilirubin    slight elevation at 1.3  . Hypertension   . Hypokalemia   . Intracranial hypertension   . Migraines   . Mild intellectual disability   . Schizoaffective disorder [facility])     Past Surgical History:  Procedure Laterality Date  . NO PAST SURGERIES      Family History:  Family History  Problem Relation Age of Onset  . Schizophrenia Mother   . Hypertension Mother   . Anesthesia problems Neg Hx   . Malignant hyperthermia Neg Hx   . Pseudochol deficiency Neg Hx   . Hypotension Neg Hx   . Migraines Neg Hx     Social History:  reports that she has been smoking cigarettes.  She has a 15.00 pack-year smoking history. She has never used smokeless tobacco. She reports that she has current or past drug history. Drug: Marijuana. She reports that she does not drink alcohol.  Additional Social History:  Alcohol / Drug Use Pain Medications: See MAR Prescriptions: See MAR Over the Counter: See MAR History of alcohol / drug use?: Yes Longest period of sobriety (when/how long): Unknown Negative Consequences of Use: (Denies) Withdrawal Symptoms: (Denies) Substance #1 Name of Substance 1: Cannabis 1 - Age of First Use: Pt states "years ago"  1 - Amount (size/oz): Pt reports varied amounts 1 - Frequency: Pt reports she  is currently maintaining her sobriety 1 - Duration: Unknown 1 - Last Use / Amount: Pt states she cannot recall   CIWA: CIWA-Ar BP: (!) 155/88 Pulse Rate: 77 COWS:    Allergies:  Allergies  Allergen Reactions  . Penicillins Anaphylaxis    Has patient had a PCN reaction causing immediate rash, facial/tongue/throat  swelling, SOB or lightheadedness with hypotension: throat and face swelling. YES Has patient had a PCN reaction causing severe rash involving mucus membranes or skin necrosis: Yes Has patient had a PCN reaction that required hospitalization Yes Has patient had a PCN reaction occurring within the last 10 years: No If all of the above answers are "NO", then may proceed with Cephalosporin use.   . Bee Venom Swelling  . Coconut Oil Hives and Swelling    ALLERGIES TO COCONUT    Home Medications:  (Not in a hospital admission)  OB/GYN Status:  Patient's last menstrual period was 03/07/2018.  General Assessment Data Location of Assessment: [facility] TTS Assessment: In system Is this a Tele or Face-to-Face Assessment?: Face-to-Face Is this an Initial Assessment or a Re-assessment for this encounter?: Initial Assessment Marital status: Married [place] name: NA Is patient pregnant?: No Pregnancy Status: Unknown Living Arrangements: Spouse/significant other Can pt return to current living arrangement?: Yes Admission Status: Voluntary Is patient capable of signing voluntary admission?: Yes Referral Source: Self/Family/Friend Insurance type: Medicaid  Medical Screening Exam ([place]) Medical Exam completed: Yes  Crisis Care Plan Living Arrangements: Spouse/significant other Legal Guardian: (NA) Name of Psychiatrist: [facility]  Name of Therapist: [facility]  Education Status Is patient currently in school?: No Is the patient employed, unemployed or receiving disability?: Unemployed  Risk to self with the past 6 months Suicidal Ideation: Yes-Currently Present Has patient been a risk to self within the past 6 months prior to admission? : Yes Suicidal Intent: Yes-Currently Present Has patient had any suicidal intent within the past 6 months prior to admission? : Yes Is patient at risk for suicide?: Yes Suicidal Plan?: Yes-Currently Present Has patient had any suicidal  plan within the past 6 months prior to admission? : No Specify Current Suicidal Plan: Overdose on OTC medications Access to Means: Yes Specify Access to Suicidal Means: Pt has OTC medications What has been your use of drugs/alcohol within the last 12 months?: Pt denies current use Previous Attempts/Gestures: Yes How many times?: 3 Other Self Harm Risks: Relationship issues Triggers for Past Attempts: Other (Comment)(Relationship issues) Intentional Self Injurious Behavior: None Family Suicide History: No Recent stressful life event(s): Other (Comment)(Increased depressive symptoms) Persecutory voices/beliefs?: No Depression: Yes Depression Symptoms: (Pt is vague in reference to symptoms) Substance abuse history and/or treatment for substance abuse?: Yes Suicide prevention information given to non-admitted patients: Not applicable  Risk to Others within the past 6 months Homicidal Ideation: No Does patient have any lifetime risk of violence toward others beyond the six months prior to admission? : No Thoughts of Harm to Others: No Current Homicidal Intent: No Current Homicidal Plan: No Access to Homicidal Means: No Identified Victim: NA History of harm to others?: No Assessment of Violence: None Noted Violent Behavior Description: NA Does patient have access to weapons?: No Criminal Charges Pending?: No Does patient have a court date: No Is patient on probation?: No  Psychosis Hallucinations: Auditory, Visual Delusions: None noted  Mental Status Report Appearance/Hygiene: In scrubs Eye Contact: Poor Motor Activity: Freedom of movement Speech: Soft, Slow Level of Consciousness: Drowsy Mood: Depressed Affect: Flat Anxiety Level:  Minimal Thought Processes: Coherent, Relevant Judgement: Unimpaired Orientation: Person, Place, Time Obsessive Compulsive Thoughts/Behaviors: None  Cognitive Functioning Concentration: Decreased Memory: Recent Intact, Remote Intact Is  patient IDD: (Hx of mild IDD per notes) Is patient DD?: No Insight: Fair Impulse Control: Poor Appetite: Fair Have you had any weight changes? : No Change Sleep: No Change Total Hours of Sleep: 7 Vegetative Symptoms: None  ADLScreening [facility] Assessment Services) Patient's cognitive ability adequate to safely complete daily activities?: Yes Patient able to express need for assistance with ADLs?: Yes Independently performs ADLs?: Yes (appropriate for developmental age)  Prior Inpatient Therapy Prior Inpatient Therapy: Yes Prior Therapy Dates: 2019 Prior Therapy Facilty/Provider([facility]): [facility] Reason for Treatment: MH issues S/I  Prior Outpatient Therapy Prior Outpatient Therapy: Yes Prior Therapy Dates: Ongoing Prior Therapy Facilty/Provider(s): [facility] Reason for Treatment: Med mang Does patient have an ACCT team?: Yes Does patient have Intensive In-House Services?  : No Does patient have [facility] services? : No Does patient have P4CC services?: No  ADL Screening (condition at time of admission) Patient's cognitive ability adequate to safely complete daily activities?: Yes Is the patient deaf or have difficulty hearing?: No Does the patient have difficulty seeing, even when wearing glasses/contacts?: No Does the patient have difficulty concentrating, remembering, or making decisions?: No Patient able to express need for assistance with ADLs?: Yes Does the patient have difficulty dressing or bathing?: No Independently performs ADLs?: Yes (appropriate for developmental age) Does the patient have difficulty walking or climbing stairs?: No Weakness of Legs: None Weakness of Arms/Hands: None  Home Assistive Devices/Equipment Home Assistive Devices/Equipment: None  Therapy Consults (therapy consults require a physician order) PT Evaluation Needed: No OT Evalulation Needed: No SLP Evaluation Needed: No Abuse/Neglect Assessment (Assessment to be complete while patient  is alone) Physical Abuse: Denies Verbal Abuse: Denies Sexual Abuse: Denies Exploitation of patient/patient's resources: Denies Self-Neglect: Denies Values / Beliefs Cultural Requests During Hospitalization: None Spiritual Requests During Hospitalization: None Consults Spiritual Care Consult Needed: No Social Work Consult Needed: No Regulatory affairs officer (For Healthcare) Does Patient Have a Medical Advance Directive?: No Would patient like information on creating a medical advance directive?: No - Patient declined    Additional Information 1:1 In Past 12 Months?: No CIRT Risk: No Elopement Risk: No Does patient have medical clearance?: Yes     Disposition: Case was staffed with Reita Cliche DNP who recommended patient be admitted inpatient as appropriate bed placement is investigated.   Disposition Initial Assessment Completed for this Encounter: Yes Disposition of Patient: Admit Type of inpatient treatment program: Adult Patient refused recommended treatment: No Mode of transportation if patient is discharged?: Tomasita Crumble)  On Site Evaluation by:   Reviewed with Physician:    Mamie Nick 03/21/2018 11:38 AM

## 2018-03-21 NOTE — Progress Notes (Signed)
Adult Psychoeducational Group Note  Date:  03/21/2018 Time:  9:30 PM  Group Topic/Focus:  Wrap-Up Group:   The focus of this group is to help patients review their daily goal of treatment and discuss progress on daily workbooks.  Participation Level:  Active  Participation Quality:  Appropriate  Affect:  Appropriate  Cognitive:  Appropriate  Insight: Appropriate  Engagement in Group:  Engaged  Modes of Intervention:  Discussion  Additional Comments: The patient expressed that she rates today a 5.The patient also said that she attended group.  Nash Shearer 03/21/2018, 9:30 PM

## 2018-03-22 DIAGNOSIS — Z818 Family history of other mental and behavioral disorders: Secondary | ICD-10-CM

## 2018-03-22 DIAGNOSIS — F17210 Nicotine dependence, cigarettes, uncomplicated: Secondary | ICD-10-CM

## 2018-03-22 DIAGNOSIS — T50902A Poisoning by unspecified drugs, medicaments and biological substances, intentional self-harm, initial encounter: Secondary | ICD-10-CM

## 2018-03-22 DIAGNOSIS — F251 Schizoaffective disorder, depressive type: Principal | ICD-10-CM

## 2018-03-22 DIAGNOSIS — T39312A Poisoning by propionic acid derivatives, intentional self-harm, initial encounter: Secondary | ICD-10-CM

## 2018-03-22 DIAGNOSIS — T391X2A Poisoning by 4-Aminophenol derivatives, intentional self-harm, initial encounter: Secondary | ICD-10-CM

## 2018-03-22 DIAGNOSIS — T1491XA Suicide attempt, initial encounter: Secondary | ICD-10-CM

## 2018-03-22 DIAGNOSIS — Z915 Personal history of self-harm: Secondary | ICD-10-CM

## 2018-03-22 DIAGNOSIS — Z79899 Other long term (current) drug therapy: Secondary | ICD-10-CM

## 2018-03-22 MED ORDER — TRAZODONE HCL 50 MG PO TABS
50.0000 mg | ORAL_TABLET | Freq: Every evening | ORAL | Status: DC | PRN
  Administered 2018-03-22 – 2018-03-25 (×4): 50 mg via ORAL
  Filled 2018-03-22 (×4): qty 1

## 2018-03-22 MED ORDER — ARIPIPRAZOLE 5 MG PO TABS
5.0000 mg | ORAL_TABLET | Freq: Every day | ORAL | Status: DC
  Administered 2018-03-23 – 2018-03-26 (×4): 5 mg via ORAL
  Filled 2018-03-22 (×5): qty 1

## 2018-03-22 MED ORDER — LAMOTRIGINE 25 MG PO TABS
25.0000 mg | ORAL_TABLET | Freq: Every day | ORAL | Status: DC
  Administered 2018-03-23 – 2018-03-26 (×4): 25 mg via ORAL
  Filled 2018-03-22 (×6): qty 1

## 2018-03-22 NOTE — BHH Group Notes (Signed)
[facility] Group Notes:  (Nursing/MHT/Case Management/Adjunct)  Date:  03/22/2018  Time:  4:01 PM  Type of Therapy:  Psychoeducational Skills  Participation Level:  Minimal  Participation Quality:  Inattentive  Affect:  Blunted  Cognitive:  Lacking  Insight:  Limited  Engagement in Group:  Limited  Modes of Intervention:  Discussion and Education  Summary of Progress/Problems:  In this group we discussed health boundaries. We defined what porous, rigid, and healthy boundaries looked like when applied to our lives. We also discussed the different types of boundaries. RN encouraged them to complete the worksheet at the end of the lesson on their own. Patient left early from group therapy session and did not participate while in group.  Lesli Albee 03/22/2018, 4:01 PM

## 2018-03-22 NOTE — BHH Suicide Risk Assessment (Addendum)
[facility] Admission Suicide Risk Assessment   Nursing information obtained from:  Patient Demographic factors:  Low socioeconomic status Current Mental Status:  NA Loss Factors:  NA Historical Factors:  NA Risk Reduction Factors:  NA  Total Time spent with patient: 45 minutes Principal Problem: Schizoaffective disorder, depressive type ([place]) Diagnosis:   Patient Active Problem List   Diagnosis Date Noted  . Schizoaffective disorder ([place]) [F25.9] 07/29/2017  . MDD (major depressive disorder), recurrent episode, severe ([place]) [F33.2] 02/19/2017  . Schizoaffective disorder, bipolar type ([place]) [F25.0] 01/06/2017  . 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 IIH (idiopathic intracranial hypertension) [G93.2] 01/24/2016  . Cannabis use disorder, moderate, dependence ([place]) [F12.20] 08/18/2015  . Schizoaffective disorder, depressive type ([place]) [F25.1] 01/15/2012    Class: Acute   Subjective Data:   Continued Clinical Symptoms:  Alcohol Use Disorder Identification Test Final Score (AUDIT): 0 The "Alcohol Use Disorders Identification Test", Guidelines for Use in Primary Care, Second Edition.  World Pharmacologist [facility]). Score between 0-7:  no or low risk or alcohol related problems. Score between 8-15:  moderate risk of alcohol related problems. Score between 16-19:  high risk of alcohol related problems. Score 20 or above:  warrants further diagnostic evaluation for alcohol dependence and treatment.   CLINICAL FACTORS:  42 year old married female who lives with mother-in-law and husband. Presented to ED on 6/15 reporting worsening depression, suicidal ideations, auditory hallucinations telling her to kill herself, and overdose on 15 tablets of OTC analgesics, described as Tylenol and Advil. Patient states she has been feeling depressed over recent days, she cannot identify any specific trigger or cause for her worsening depression. Endorses neurovegetative symptoms of depression to  include low energy, anhedonia, sadness.  States appetite and sleep have been normal. She has a history of prior psychiatric admissions, most recently in January 2019 for similar presentation and attempting to choke herself with a belt .  She has been diagnosed with Schizoaffective Disorder. She reports being prescribed Zoloft , Lamictal,and Abilify ER IM q month, she is unsure when her last dose was but states that her next dose should be "coming up soon". She denies any alcohol or drug abuse. Medical history is remarkable for hypertension.  She is allergic to penicillin.  She smokes a pack of cigarettes per day.  Dx-Schizoaffective Disorder, Depressed   Plan- Inpatient admission Continue Zoloft 100 mgrs QDAY  Continue Lamictal 25 mgrs QDAY Treatment team to work on determining date of last Abilify ER SRER administration to determine when her next dose should be. Based on history of good response to Abilify, and report her last IM dose was a few weeks ago, will start oral Abilify at 5 mgrs QDAY in lieu of resuming long acting [place] Aripiprazole. Check Lipid Panel, HgbA1C, TSH    Musculoskeletal: Strength & Muscle Tone: within normal limits Gait & Station: normal Patient leans: N/A  Psychiatric Specialty Exam: Physical Exam  ROS denies headache, denies chest pain, no shortness of breath  Blood pressure 125/81, pulse (!) 118, temperature 98.1 F (36.7 C), temperature source Oral, resp. rate 18, height 5' 6.5" (1.689 m), weight 102.1 kg (225 lb), last menstrual period 03/07/2018.Body mass index is 35.77 kg/m.  General Appearance: Fairly Groomed  Eye Contact:  Good  Speech:  Normal Rate  Volume:  Decreased  Mood:  Depressed  Affect:  Blunted, but improves partially during session and smiles briefly at times appropriately  Thought Process:  Linear and Descriptions of Associations: Intact- slowed thought process  Orientation:  Other:  Oriented to place and time  Thought Content:   Reports  auditory hallucinations, does not appear internally preoccupied.  Suicidal Thoughts:  No today denies suicidal ideations, states she feels better now that she is in the hospital, contracts for safety on unit  Homicidal Thoughts:  No denies homicidal or violent ideations  Memory:  Recent and remote fair  Judgement:  Fair  Insight:  Fair  Psychomotor Activity:  Decreased  Concentration:  Concentration: Fair and Attention Span: Fair  Recall:  [employer] of Knowledge:  Fair  Language:  Fair  Akathisia:  Negative  Handed:  Right  AIMS (if indicated):     Assets:  Desire for Improvement Resilience  ADL's:  Intact  Cognition:  WNL  Sleep:  Number of Hours: 6.75      COGNITIVE FEATURES THAT CONTRIBUTE TO RISK:  Closed-mindedness and Loss of executive function    SUICIDE RISK:   Moderate:  Frequent suicidal ideation with limited intensity, and duration, some specificity in terms of plans, no associated intent, good self-control, limited dysphoria/symptomatology, some risk factors present, and identifiable protective factors, including available and accessible social support.  PLAN OF CARE: Patient will be admitted to inpatient psychiatric unit for stabilization and safety. Will provide and encourage milieu participation. Provide medication management and maked adjustments as needed.  Will follow daily.    I certify that inpatient services furnished can reasonably be expected to improve the patient's condition.   Jenne Campus, MD 03/22/2018, 4:41 PM

## 2018-03-22 NOTE — H&P (Addendum)
Psychiatric Admission Assessment Adult  Patient Identification: Sarah [name] MRN:  [id number] Date of Evaluation:  03/22/2018 Chief Complaint:  Suicidal attempt Principal Diagnosis: Schizoaffective disorder, depressive type ([place]) Diagnosis:   Patient Active Problem List   Diagnosis Date Noted  . Schizoaffective disorder ([place]) [F25.9] 07/29/2017  . MDD (major depressive disorder), recurrent episode, severe ([place]) [F33.2] 02/19/2017  . Schizoaffective disorder, bipolar type ([place]) [F25.0] 01/06/2017  . 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 IIH (idiopathic intracranial hypertension) [G93.2] 01/24/2016  . Cannabis use disorder, moderate, dependence ([place]) [F12.20] 08/18/2015  . Schizoaffective disorder, depressive type ([place]) [F25.1] 01/15/2012    Class: Acute   History of Present Illness:  Per assessment note: Sarah [name] is an 42 y.o. female that presents this date with S/I after a attempted overdose on OTC medications yesterday. Patient continues to voice thoughts of self harm at the time of assessment and cannot contract for safety. Patient is observed to be drowsy and speaks in a low soft voice as she interacts with this Probation officer. Patient reports she took a unknown amount of OTC medications yesterday to include: Tylenol, Advil and numerous pain medications. Patient is very vague in reference to what medications she actually consumed or time frame. Patient reports three previous attempts at self harm and per notes was last seen by this provider on 02/11/18 when she presented with similar symptoms associated with S/I. Patient cannot identify any immediate stressors that prompted this incident but reports increased depressive symptoms to include: feeling hopeless and "just wanting to give up." Patient is currently receiving services from [facility] who assists with medication management for patient's Schizoaffective disorder. Patient also reports increased AVH over the last week  stating she sees "people she doesn't know" and hears their voices that tell her to kill herself. Patient is vague in reference to [place] symptoms and her AVH. Patient does report current medication compliance but feels her medications are not working as indicated. Patient reports she last met with her provider two weeks ago to receive medications. Patient denies any recent medication changes. Patient cannot contact for safety at this time and is requesting assistance to assist with stabilization. Patient's UDS was negative and BAL less than five. Per notes, patient states she took numerous pain medications in an attempt to kill herself yesterday morning. Patient states she took Tylenol, Advil, and some prescription pain medication. Patient states she vomited a couple of hours after taking the medication. Patient states she is hearing voices telling her to kill herself  Evaluation: Sarah [name] awake alert and oriented.  Presents flat guarded and dysphoric during this assessment.  Patient is minimal with responses.  States she is followed by IT however continues to be suicidal but did attempted overdose.  Reports previous inpatient admissions.  States she is unable to recall which medication she is prescribed however takes medications intermittently.  Reviewed above information patient is followed by active team for medication management denies that she currently employed at this time.  States she is married.  Denies physical sexual abuse in the past.  Support encouragement reassurance was provided.  Total Time spent with patient: 1 hour  Past Psychiatric History: per previous assessment Schizoaffective disorder and SUD. She has had multiple hospitalization over the years. She is maintained on Abilify, Sertraline and Trazodone. Reports past suicidal behavior. Overdosed twice and was admitted medically.     Is the patient at risk to self? Yes.    Has the patient been a risk to self in the  past 6 months? Yes  Has the  patient been a risk to self within the distant past? Yes.    Is the patient a risk to others? No.  Has the patient been a risk to others in the past 6 months? No.  Has the patient been a risk to others within the distant past? No.   Prior Inpatient Therapy:   Prior Outpatient Therapy:    Alcohol Screening: Patient refused Alcohol Screening Tool: Yes 1. How often do you have a drink containing alcohol?: Never 2. How many drinks containing alcohol do you have on a typical day when you are drinking?: 1 or 2 3. How often do you have six or more drinks on one occasion?: Never AUDIT-C Score: 0 4. How often during the last year have you found that you were not able to stop drinking once you had started?: Never 5. How often during the last year have you failed to do what was normally expected from you becasue of drinking?: Never 6. How often during the last year have you needed a first drink in the morning to get yourself going after a heavy drinking session?: Never 7. How often during the last year have you had a feeling of guilt of remorse after drinking?: Never 8. How often during the last year have you been unable to remember what happened the night before because you had been drinking?: Never 9. Have you or someone else been injured as a result of your drinking?: No 10. Has a relative or friend or a doctor or another health worker been concerned about your drinking or suggested you cut down?: No Alcohol Use Disorder Identification Test Final Score (AUDIT): 0 Intervention/Follow-up: AUDIT Score <7 follow-up not indicated Substance Abuse History in the last 12 months:  Yes.   Consequences of Substance Abuse: NA Previous Psychotropic Medications: Yes  Psychological Evaluations: Yes  Past Medical History:  Past Medical History:  Diagnosis Date  . Anemia   . Anxiety   . Depression   . Elevated bilirubin    slight elevation at 1.3  . Hypertension   . Hypokalemia   . Intracranial  hypertension   . Migraines   . Mild intellectual disability   . Schizoaffective disorder [facility])     Past Surgical History:  Procedure Laterality Date  . NO PAST SURGERIES     Family History:  Family History  Problem Relation Age of Onset  . Schizophrenia Mother   . Hypertension Mother   . Anesthesia problems Neg Hx   . Malignant hyperthermia Neg Hx   . Pseudochol deficiency Neg Hx   . Hypotension Neg Hx   . Migraines Neg Hx    Family Psychiatric  History: Mother has Schizoaffective disorder Tobacco Screening: Have you used any form of tobacco in the last 30 days? (Cigarettes, Smokeless Tobacco, Cigars, and/or Pipes): Yes Tobacco use, Select all that apply: 5 or more cigarettes per day Are you interested in Tobacco Cessation Medications?: No, patient refused Counseled patient on smoking cessation including recognizing danger situations, developing coping skills and basic information about quitting provided: Yes Social History:  Social History   Substance and Sexual Activity  Alcohol Use No   Comment: denied all drug/alcohol use     Social History   Substance and Sexual Activity  Drug Use Yes  . Types: Marijuana   Comment: denied all drug/alcohol use    Additional Social History:       No kids Allergies:   Allergies  Allergen  Reactions  . Penicillins Anaphylaxis    Has patient had a PCN reaction causing immediate rash, facial/tongue/throat swelling, SOB or lightheadedness with hypotension: throat and face swelling. YES Has patient had a PCN reaction causing severe rash involving mucus membranes or skin necrosis: Yes Has patient had a PCN reaction that required hospitalization Yes Has patient had a PCN reaction occurring within the last 10 years: No If all of the above answers are "NO", then may proceed with Cephalosporin use.   . Bee Venom Swelling  . Coconut Oil Hives and Swelling    ALLERGIES TO COCONUT   Lab Results:  Results for orders placed or performed  during the hospital encounter of 03/21/18 (from the past 48 hour(s))  Rapid urine drug screen (hospital performed)     Status: Abnormal   Collection Time: 03/21/18  7:41 AM  Result Value Ref Range   Opiates NONE DETECTED NONE DETECTED   Cocaine NONE DETECTED NONE DETECTED   Benzodiazepines NONE DETECTED NONE DETECTED   Amphetamines NONE DETECTED NONE DETECTED   Tetrahydrocannabinol NONE DETECTED NONE DETECTED   Barbiturates (A) NONE DETECTED    Result not available. Reagent lot number recalled by manufacturer.    Comment: (NOTE) DRUG SCREEN FOR MEDICAL PURPOSES ONLY.  IF CONFIRMATION IS NEEDED FOR ANY PURPOSE, NOTIFY LAB WITHIN 5 DAYS. LOWEST DETECTABLE LIMITS FOR URINE DRUG SCREEN Drug Class                     Cutoff (ng/mL) Amphetamine and metabolites    1000 Barbiturate and metabolites    200 Benzodiazepine                 211 Tricyclics and metabolites     300 Opiates and metabolites        300 Cocaine and metabolites        300 THC                            50 Performed at [facility], [place] [address], [place], [place] [address]   Comprehensive metabolic panel     Status: Abnormal   Collection Time: 03/21/18  8:40 AM  Result Value Ref Range   Sodium 143 135 - 145 mmol/L   Potassium 3.7 3.5 - 5.1 mmol/L   Chloride 109 101 - 111 mmol/L   CO2 26 22 - 32 mmol/L   Glucose, Bld 83 65 - 99 mg/dL   BUN 13 6 - 20 mg/dL   Creatinine, Ser 0.65 0.44 - 1.00 mg/dL   Calcium 8.3 (L) 8.9 - 10.3 mg/dL   Total Protein 7.2 6.5 - 8.1 g/dL   Albumin 3.8 3.5 - 5.0 g/dL   AST 19 15 - 41 U/L   ALT 16 14 - 54 U/L   Alkaline Phosphatase 73 38 - 126 U/L   Total Bilirubin 0.5 0.3 - 1.2 mg/dL   GFR calc non Af Amer >60 >60 mL/min   GFR calc Af Amer >60 >60 mL/min    Comment: (NOTE) The eGFR has been calculated using the CKD EPI equation. This calculation has not been validated in all clinical situations. eGFR's persistently <60 mL/min signify possible Chronic  Kidney Disease.    Anion gap 8 5 - 15    Comment: Performed at [facility], [place] [address] [place], [place] [address]  Ethanol     Status: None   Collection Time: 03/21/18  8:40 AM  Result Value Ref Range   Alcohol, Ethyl (B) <10 <10 mg/dL    Comment: (NOTE) Lowest detectable limit for serum alcohol is 10 mg/dL. For medical purposes only. Performed at [facility], [place] [address] [place], [place] [address]   Salicylate level     Status: None   Collection Time: 03/21/18  8:40 AM  Result Value Ref Range   Salicylate Lvl <3.6 2.8 - 30.0 mg/dL    Comment: Performed at [facility], [place] [address], [place], [place] [address]  Acetaminophen level     Status: Abnormal   Collection Time: 03/21/18  8:40 AM  Result Value Ref Range   Acetaminophen (Tylenol), Serum <10 (L) 10 - 30 ug/mL    Comment: (NOTE) Therapeutic concentrations vary significantly. A range of 10-30 ug/mL  may be an effective concentration for many patients. However, some  are best treated at concentrations outside of this range. Acetaminophen concentrations >150 ug/mL at 4 hours after ingestion  and >50 ug/mL at 12 hours after ingestion are often associated with  toxic reactions. Performed at [facility], [place] [address], [place], [place] [address]   cbc     Status: Abnormal   Collection Time: 03/21/18  8:40 AM  Result Value Ref Range   WBC 8.4 4.0 - 10.5 K/uL   RBC 3.86 (L) 3.87 - 5.11 MIL/uL   Hemoglobin 9.9 (L) 12.0 - 15.0 g/dL   HCT 31.9 (L) 36.0 - 46.0 %   MCV 82.6 78.0 - 100.0 fL   MCH 25.6 (L) 26.0 - 34.0 pg   MCHC 31.0 30.0 - 36.0 g/dL   RDW 18.9 (H) 11.5 - 15.5 %   Platelets 467 (H) 150 - 400 K/uL    Comment: Performed at [facility], [place] [address], [place], [place] [address]  I-Stat beta hCG blood, ED     Status: None   Collection Time: 03/21/18  8:48 AM  Result Value Ref Range   I-stat hCG,  quantitative <5.0 <5 mIU/mL   Comment 3            Comment:   GEST. AGE      CONC.  (mIU/mL)   <=1 WEEK        5 - 50     2 WEEKS       50 - 500     3 WEEKS       100 - 10,000     4 WEEKS     1,000 - 30,000        FEMALE AND NON-PREGNANT FEMALE:     LESS THAN 5 mIU/mL     Blood Alcohol level:  Lab Results  Component Value Date   ETH <10 03/21/2018   ETH <10 50/93/2671    Metabolic Disorder Labs:  Lab Results  Component Value Date   HGBA1C 5.1 07/31/2017   MPG 99.67 07/31/2017   MPG 108 01/04/2017   Lab Results  Component Value Date   PROLACTIN 17.5 07/31/2017   PROLACTIN 18.7 01/04/2017   Lab Results  Component Value Date   CHOL 179 01/04/2017   TRIG 158 (H) 01/04/2017   HDL 58 01/04/2017   CHOLHDL 3.1 01/04/2017   VLDL 32 01/04/2017   LDLCALC 89 01/04/2017   LDLCALC 72 08/19/2015    Current Medications: Current Facility-Administered Medications  Medication Dose Route Frequency Provider Last Rate Last Dose  . acetaminophen (TYLENOL) tablet 650 mg  650 mg Oral Q6H PRN Waylan Boga  Y, NP      . alum & mag hydroxide-simeth (MAALOX/MYLANTA) 200-200-20 MG/5ML suspension 30 mL  30 mL Oral Q4H PRN [name], NP      . amLODipine (NORVASC) tablet 10 mg  10 mg Oral Daily [name], NP   10 mg at 03/22/18 0037  . hydrochlorothiazide (HYDRODIURIL) tablet 25 mg  25 mg Oral Daily [name], NP   25 mg at 03/22/18 0488  . hydrOXYzine (ATARAX/VISTARIL) tablet 25 mg  25 mg Oral Q6H PRN [name], NP      . lamoTRIgine (LAMICTAL) tablet 25 mg  25 mg Oral BID [name], NP   25 mg at 03/22/18 0826  . magnesium hydroxide (MILK OF MAGNESIA) suspension 30 mL  30 mL Oral Daily PRN [name], NP      . sertraline (ZOLOFT) tablet 100 mg  100 mg Oral Daily [name], NP   100 mg at 03/22/18 0826  . traZODone (DESYREL) tablet 150 mg  150 mg Oral QHS PRN [name], NP   150 mg at 03/21/18 2229   PTA Medications: Medications Prior to  Admission  Medication Sig Dispense Refill Last Dose  . amLODipine (NORVASC) 10 MG tablet Take 1 tablet (10 mg total) by mouth daily. For high blood pressure 10 tablet 0 03/21/2018 at Unknown time  . ARIPiprazole ER 400 MG SRER Inject 400 mg into the muscle every 21 ( twenty-one) days. (Patient taking differently: Inject 400 mg into the muscle every 28 (twenty-eight) days. ) 1 each 0 Past Month at Unknown time  . hydrochlorothiazide (HYDRODIURIL) 25 MG tablet Take 1 tablet (25 mg total) by mouth daily. For high blood pressure 10 tablet 0 03/21/2018 at Unknown time  . Cholecalciferol (VITAMIN D PO) Take 1 capsule by mouth daily.   Unknown at Unknown time  . hydrOXYzine (ATARAX/VISTARIL) 25 MG tablet Take 1 tablet (25 mg total) by mouth every 6 (six) hours as needed for anxiety. 90 tablet 0 Unknown at Unknown time  . lamoTRIgine (LAMICTAL) 25 MG tablet Take 25 mg by mouth daily.    Unknown at Unknown time  . nicotine polacrilex (NICORETTE) 2 MG gum Take 1 each (2 mg total) by mouth as needed for smoking cessation. (Patient not taking: Reported on 02/11/2018) 100 tablet 0 Unknown at Unknown time  . sertraline (ZOLOFT) 100 MG tablet Take 1 tablet (100 mg total) by mouth daily. 30 tablet 0 Unknown at Unknown time  . traZODone (DESYREL) 150 MG tablet Take 1 tablet (150 mg total) by mouth at bedtime as needed for sleep. 30 tablet 0 Unknown at Unknown time    Musculoskeletal: Strength & Muscle Tone: within normal limits Gait & Station: normal Patient leans: N/A  Psychiatric Specialty Exam: Physical Exam  Constitutional: She is oriented to person, place, and time. She appears well-developed and well-nourished.  HENT:  Head: Normocephalic and atraumatic.  Respiratory: Effort normal.  Neurological: She is alert and oriented to person, place, and time.  Psychiatric: She has a normal mood and affect. Her behavior is normal.  As above.     Review of Systems  Psychiatric/Behavioral: Positive for depression  and suicidal ideas. The patient is nervous/anxious.   All other systems reviewed and are negative.   Blood pressure 125/81, pulse (!) 118, temperature 98.1 F (36.7 C), temperature source Oral, resp. rate 18, height 5' 6.5" (1.689 m), weight 102.1 kg (225 lb), last menstrual period 03/07/2018.Body mass index is 35.77  kg/m.  General Appearance: Overweight, hirsute, in hospital clothing. Poor personal hygiene. Withdrawn. Calm and cooperative  Eye Contact:  Fair  Speech:  Decreased rate and tone  Volume:  Soft spoken  Mood: flat, depressed   Affect:  Odd and has a psychotic quality   Thought Process:  Linear  Orientation:  Full (Time, Place, and Person)  Thought Content: . No preoccupation with violent thoughts. No negative ruminations. No hallucination in any modality.   Suicidal Thoughts:  None currently  Homicidal Thoughts:  No  Memory:  Immediate;   Good Recent;   Fair Remote;   Fair  Judgement:  Fair  Insight:  Good  Psychomotor Activity:  Decreased  Concentration:  Concentration: Fair and Attention Span: Fair  Recall:  [employer] of Knowledge:  Fair  Language:  Good  Akathisia:  Negative  Handed:    AIMS (if indicated):     Assets:  Housing Intimacy Resilience  ADL's:  Impaired  Cognition:  WNL  Sleep:  Number of Hours: 6.75    Treatment Plan Summary: Daily contact with patient to assess and evaluate symptoms and progress in treatment and Medication management   Continue with Lamictal 25 mg and Zoloft 100 mg for mood stabilization. Continue with Trazodone 175m for insomnia  Continue Norvasc 10 mg PO QD HTN  Will continue to monitor vitals ,medication compliance and treatment side effects while patient is here.   Reviewed labs: ,BAL -  UDS -   CSW will start working on disposition.  Patient to participate in therapeutic milieu    Observation Level/Precautions:  15 minute checks  Laboratory:  Cbc,cmp usd   Psychotherapy:  indivial and group session   Medications:  See SRA by MD  Consultations:  csw and psychiary  Discharge Concerns:  Safety, stabilization, and risk of access to medication and medication stabilization   Estimated LOS: 5-7 days  Other:     Physician Treatment Plan for Primary Diagnosis: Schizoaffective disorder, depressive type ([place] Long Term Goal(s): Improvement in symptoms so as ready for discharge  Short Term Goals: Ability to identify changes in lifestyle to reduce recurrence of condition will improve, Ability to verbalize feelings will improve, Ability to disclose and discuss suicidal ideas and Ability to demonstrate self-control will improve  Physician Treatment Plan for Secondary Diagnosis: Principal Problem:   Schiz[place]ffective disorder, depressive type ([place]  Long Term Goal(s): Improvement in symptoms so as ready for discharge  Short Term Goals: Ability to demonstrate self-control will improve and Ability to maintain clinical measurements within normal limits will improve  I certify that inpatient services furnished can reasonably be expected to improve the patient's condition.    [place]ill [place] NP 6/16/201912:15 PM  I have discussed case with NP and have met with patient  Agree with NP note and assessment  42year old married female who lives with mother-in-law and husband. Presented to ED on 6/15 reporting worsening depression, suicidal ideations, auditory hallucinations telling her to kill herself, and overdose on 15 tablets of OTC analgesics, described as Tylenol and Advil. Patient states she has been feeling depressed over recent days, she cannot identify any specific trigger or cause for her worsening depression. Endorses neurovegetative symptoms of depression to include low energy, anhedonia, sadness.  States appetite and sleep have been normal. She has a history of prior psychiatric admissions, most recently in January 2019 for similar presentation and attempting to choke herself with a belt .  She  has been diagnosed with Schizoaffective Disorder. She  reports being prescribed Zoloft , Lamictal,and Abilify ER IM q month, she is unsure when her last dose was but states that her next dose should be "coming up soon". She denies any alcohol or drug abuse. Medical history is remarkable for hypertension.  She is allergic to penicillin.  She smokes a pack of cigarettes per day.  Dx-Schizoaffective Disorder, Depressed   Plan- Inpatient admission Continue Zoloft 100 mgrs QDAY  Continue Lamictal 25 mgrs QDAY Treatment team to work on determining date of last Abilify ER SRER administration to determine when her next dose should be. Based on history of good response to Abilify, and report her last IM dose was a few weeks ago, will start oral Abilify at 5 mgrs QDAY in lieu of resuming long acting [place] Aripiprazole. Check Lipid Panel, HgbA1C, TSH

## 2018-03-22 NOTE — [facility] Group Notes (Signed)
Adult Psychoeducational Group Note  Date:  03/22/2018 Time:  9:30 AM  Group Topic/Focus: Orientation/Goals Group Orientation:   The focus of this group is to educate the patient on the purpose and policies of crisis stabilization and provide a format to answer questions about their admission.  The group details unit policies and expectations of patients while admitted. Goals Group:   The focus of this group is to help patients establish daily goals to achieve during treatment and discuss how the patient can incorporate goal setting into their daily lives to aide in recovery.   Participation Level:  Did Not Attend  Modes of Intervention:  Discussion and Education  Additional Comments:  Patient was invited but declined to attend group.  Estill Bamberg A Kiyra Slaubaugh 03/22/2018, 10:00 AM
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
## 2018-03-22 NOTE — Plan of Care (Signed)
Problem: Safety: Goal: Periods of time without injury will increase Intervention: Patient contracts for safety on the unit. Low fall risk precautions in place. Safety monitored with q15 minute checks. Outcome: Patient remains safe on the unit at this time. 03/22/2018 2:30 PM - Progressing by Annia Friendly, RN

## 2018-03-22 NOTE — [facility] Group Notes (Signed)
[facility] LCSW Group Therapy Note  Date/Time:  03/22/2018  11:00AM-12:00PM  Type of Therapy and Topic:  Group Therapy:  Music and Mood  Participation Level:  Did Not Attend   Description of Group: In this process group, members listened to a variety of genres of music and identified that different types of music evoke different responses.  Patients were encouraged to identify music that was soothing for them and music that was energizing for them.  Patients discussed how this knowledge can help with wellness and recovery in various ways including managing depression and anxiety as well as encouraging healthy sleep habits.    Therapeutic Goals: 1. Patients will explore the impact of different varieties of music on mood 2. Patients will verbalize the thoughts they have when listening to different types of music 3. Patients will identify music that is soothing to them as well as music that is energizing to them 4. Patients will discuss how to use this knowledge to assist in maintaining wellness and recovery 5. Patients will explore the use of music as a coping skill  Summary of Patient Progress:  N/A  Therapeutic Modalities: Solution Focused Brief Therapy Activity   Selmer Dominion, LCSW

## 2018-03-22 NOTE — Progress Notes (Signed)
Patient ID: Sarah [name], female   DOB: 02/04/1976, 42 y.o.   MRN: [id number] D: Pt observed isolated to her room; remained in bed with eyes closed. Pt at assessment endorsed moderate anxiety, depression and AH, "they are still talking to me."-Pt denied command hallucinations. Pt denied SI/HI or pain. Pt remained anxious and worried looking.  A: Medications offered as prescribed. Pt was given the opportunity to asked questions. Support, encouragement, and safe environment provided. 15-minute safety checks continue. R: Pt was med compliant. All patient's questions and concerns addressed. Pt did not attend wrap-up group. Safety checks continue.

## 2018-03-22 NOTE — [facility] Group Notes (Signed)
Adult Psychoeducational Group Note  Date:  03/22/2018 Time:  10:00 AM  Group Topic/Focus: Love Language Healthy Communication:   The focus of this group is to discuss communication, barriers to communication, as well as healthy ways to communicate with others.  Participation Level:  Did Not Attend  Modes of Intervention:  Discussion and Education  Additional Comments:  Patient was invited but declined to attend group.  Estill Bamberg A Kristeen Lantz 03/22/2018, 10:30 AM

## 2018-03-22 NOTE — Progress Notes (Signed)
Patient ID: CHARLES NIESE, female   DOB: [birth year], 42 y.o.   MRN: [id number]  Nursing Progress Note [id number]  Data: Patient presents with flat/sad affect and depressed mood. Patient appears guarded and is minimal when staff attempts to engage. Patient was isolative to her room and did not attend morning groups but has gotten up for group this afternoon. Patient complaint with scheduled medications. Patient denies pain/physical complaints. Patient completed self-inventory sheet and rates depression, hopelessness, and anxiety 8,0,9 respectively. Patient rates their sleep and appetite as good/good respectively. Patient states goal for today is to "work on coping skills". Patient currently denies SI/HI but reports on-going AVH.   Action: Patient educated about and provided medication per provider's orders. Patient safety maintained with q15 min safety checks and frequent rounding. Low fall risk precautions in place. Emotional support given. 1:1 interaction and active listening provided. Patient encouraged to attend meals and groups. Patient encouraged to work on treatment plan and goals. Labs, vital signs and patient behavior monitored throughout shift.   Response: Patient agrees to come to staff if any thoughts of SI/HI develop or if patient develops intention of acting on thoughts. Patient remains safe on the unit at this time. Patient is interacting with peers appropriately on the unit. Will continue to support and monitor.

## 2018-03-23 LAB — HEMOGLOBIN A1C
HEMOGLOBIN A1C: 5.3 % (ref 4.8–5.6)
MEAN PLASMA GLUCOSE: 105.41 mg/dL

## 2018-03-23 LAB — LIPID PANEL
CHOLESTEROL: 222 mg/dL — AB (ref 0–200)
HDL: 59 mg/dL (ref >40)
LDL CALC: 146 mg/dL — AB (ref 0–99)
Total CHOL/HDL Ratio: 3.8 RATIO
Triglycerides: 83 mg/dL (ref <150)
VLDL: 17 mg/dL (ref 0–40)

## 2018-03-23 LAB — TSH: TSH: 1.19 u[IU]/mL (ref 0.350–4.500)

## 2018-03-23 MED ORDER — ARIPIPRAZOLE ER 400 MG IM SRER
400.0000 mg | INTRAMUSCULAR | Status: DC
  Administered 2018-03-23: 400 mg via INTRAMUSCULAR

## 2018-03-23 NOTE — Progress Notes (Signed)
D: Pt was in bed in her room upon initial approach.  She presents with anxious, depressed affect and mood.  She reports she feels "better" and her goal is to "go to group."  Pt denies SI/HI, hallucinations, and pain.  She has been visible in milieu with few peer interactions.  Pt attended evening group.  A:  Introduced self to pt.  Met with pt 1:1 and provided support and encouragement.  PRN medication administered for sleep and anxiety per pt request.  Q15 minute safety checks maintained.  R: Pt is compliant with medications.  She verbally contracts for safety and reports she will inform staff of needs and concerns.  Will continue to monitor and assess.

## 2018-03-23 NOTE — Progress Notes (Signed)
Recreation Therapy Notes  Date: 6.17.19 Time: 1000 Location: [address] Dayroom  Group Topic: Triggers  Goal Area(s) Addresses:  Patient will be able to identify triggers.  Patient will identify problems caused by triggers. Patient will identify three coping skills to deal with triggers.  Intervention: Worksheet  Activity: Triggers.  LRT gave patients a worksheet in which patients were to identify their triggers, the problems their triggers contribute to and come up with a trigger for each category (emotional state, people, places, things, thoughts and activities/situations) presented.  Education:Communication, Discharge Planning  Education Outcome: Acknowledges understanding/In group clarification offered/Needs additional education.   Clinical Observations/Feedback:  Pt did not attend group.    Victorino Sparrow, LRT/CTRS         Victorino Sparrow A 03/23/2018 12:37 PM

## 2018-03-23 NOTE — Progress Notes (Signed)
Patient ID: Sarah [name], female   DOB: 1976-06-09, 42 y.o.   MRN: [id number] D: Pt observed isolated to her room; remained in bed with eyes closed. Pt at assessment endorsed moderate anxiety, depression and AH, "they want me to do something good with my life."-Pt denied command hallucinations. Pt denied SI/HI or pain. Pt remained anxious and worried looking.  A: Medications offered as prescribed. Pt was given the opportunity to asked questions. Support, encouragement, and safe environment provided. 15-minute safety checks continue. R: Pt was med compliant. All patient's questions and concerns addressed. Pt did not attend wrap-up group. Safety checks continue.

## 2018-03-23 NOTE — Progress Notes (Signed)
[facility] MD Progress Note  03/23/2018 12:42 PM Sarah [name]  MRN:  [id number] Subjective:    Sarah [name] is a 42 y/o F with history of schizoaffective disorder depressive type who was admitted voluntarily from [facility] with worsening depression, CAH to kill herself, and overdose of 15 tablets of OTC analgesics (mix of tylenol and advil). Pt was medically cleared and then transferred to [facility] for additional treatment and stabilization. She was restarted on home medications of abilify (oral), lamictal, and zoloft.   Today upon evaluation, pt shares about her admission, "I was feeling depressed and I tried to kill myself." Pt denies any specific stressors which resulted in her suicide attempt. She adds, "I woke up and I didn't want to be here any more." Pt reports having SI for about 3 days prior to her hospitalization. She denies SI/HI today. She continues to report having CAH which tell her to hurt herself, but she is able to contract for safety while in the hospital. She denies VH. She is sleeping adequately. She denies physical complaints. Collateral information was obtained by SW team that pt has ACT team with whom she has been out of contact for past 2 weeks, and pt missed her injection of abilify maintena for which she was due on 03/12/18. Pt shares she was staying with her sister, and it was not her intention to be out of contact with ACT team. She is in agreement to receive Abilify Maintena injection today and to continue her other medications without changes. She is in agreement with the above plan, and she had no further questions, comments, or concerns.  Principal Problem: Schizoaffective disorder, depressive type ([place]) Diagnosis:   Patient Active Problem List   Diagnosis Date Noted  . Schizoaffective disorder ([place]) [F25.9] 07/29/2017  . MDD (major depressive disorder), recurrent episode, severe ([place]) [F33.2] 02/19/2017  . Schizoaffective disorder, bipolar type ([place]) [F25.0] 01/06/2017  . Mild  intellectual disability [F70] 01/06/2017  . IIH (idiopathic intracranial hypertension) [G93.2] 01/24/2016  . Cannabis use disorder, moderate, dependence ([place]) [F12.20] 08/18/2015  . Schizoaffective disorder, depressive type ([place]) [F25.1] 01/15/2012    Class: Acute   Total Time spent with patient: 30 minutes  Past Psychiatric History: see H&P  Past Medical History:  Past Medical History:  Diagnosis Date  . Anemia   . Anxiety   . Depression   . Elevated bilirubin    slight elevation at 1.3  . Hypertension   . Hypokalemia   . Intracranial hypertension   . Migraines   . Mild intellectual disability   . Schizoaffective disorder [facility])     Past Surgical History:  Procedure Laterality Date  . NO PAST SURGERIES     Family History:  Family History  Problem Relation Age of Onset  . Schizophrenia Mother   . Hypertension Mother   . Anesthesia problems Neg Hx   . Malignant hyperthermia Neg Hx   . Pseudochol deficiency Neg Hx   . Hypotension Neg Hx   . Migraines Neg Hx    Family Psychiatric  History: see H&P Social History:  Social History   Substance and Sexual Activity  Alcohol Use No   Comment: denied all drug/alcohol use     Social History   Substance and Sexual Activity  Drug Use Yes  . Types: Marijuana   Comment: denied all drug/alcohol use    Social History   Socioeconomic History  . Marital status: Single    Spouse name: Not on file  . Number of children:  0  . Years of education: 33  . Highest education level: Not on file  Occupational History  . Occupation: Unemployed  Social Needs  . Financial resource strain: Not on file  . Food insecurity:    Worry: Not on file    Inability: Not on file  . Transportation needs:    Medical: Not on file    Non-medical: Not on file  Tobacco Use  . Smoking status: Current Every Day Smoker    Packs/day: 1.00    Years: 15.00    Pack years: 15.00    Types: Cigarettes    Last attempt to quit: 01/02/2017    Years  since quitting: 1.2  . Smokeless tobacco: Never Used  . Tobacco comment: smoked since 42 yrs old  Substance and Sexual Activity  . Alcohol use: No    Comment: denied all drug/alcohol use  . Drug use: Yes    Types: Marijuana    Comment: denied all drug/alcohol use  . Sexual activity: Yes    Birth control/protection: None  Lifestyle  . Physical activity:    Days per week: Not on file    Minutes per session: Not on file  . Stress: Not on file  Relationships  . Social connections:    Talks on phone: Not on file    Gets together: Not on file    Attends religious service: Not on file    Active member of club or organization: Not on file    Attends meetings of clubs or organizations: Not on file    Relationship status: Not on file  Other Topics Concern  . Not on file  Social History Narrative   Lives with fiance, Erlene Quan   Caffeine use: Soda: 1 soda daily   No coffee   No tea   Additional Social History:    Pain Medications: none Prescriptions: none Over the Counter: none History of alcohol / drug use?: No history of alcohol / drug abuse                    Sleep: Good  Appetite:  Good  Current Medications: Current Facility-Administered Medications  Medication Dose Route Frequency Provider Last Rate Last Dose  . acetaminophen (TYLENOL) tablet 650 mg  650 mg Oral Q6H PRN [name], NP      . alum & mag hydroxide-simeth (MAALOX/MYLANTA) 200-200-20 MG/5ML suspension 30 mL  30 mL Oral Q4H PRN [name], NP      . amLODipine (NORVASC) tablet 10 mg  10 mg Oral Daily [name], NP   10 mg at 03/23/18 0751  . ARIPiprazole (ABILIFY) tablet 5 mg  5 mg Oral Daily [name], [name], MD   5 mg at 03/23/18 0751  . ARIPiprazole ER (ABILIFY MAINTENA) injection 400 mg  400 mg Intramuscular Q28 days Maris Berger T, MD      . hydrochlorothiazide (HYDRODIURIL) tablet 25 mg  25 mg Oral Daily [name], NP   25 mg at 03/23/18 0752  . hydrOXYzine  (ATARAX/VISTARIL) tablet 25 mg  25 mg Oral Q6H PRN [name], NP   25 mg at 03/22/18 2151  . lamoTRIgine (LAMICTAL) tablet 25 mg  25 mg Oral Daily [name], [name], MD   25 mg at 03/23/18 0751  . magnesium hydroxide (MILK OF MAGNESIA) suspension 30 mL  30 mL Oral Daily PRN [name], NP      . sertraline (ZOLOFT) tablet 100 mg  100 mg  Oral Daily [name], NP   100 mg at 03/23/18 0751  . traZODone (DESYREL) tablet 50 mg  50 mg Oral QHS PRN [name], [name], MD   50 mg at 03/22/18 2151    Lab Results:  Results for orders placed or performed during the hospital encounter of 03/21/18 (from the past 48 hour(s))  TSH     Status: None   Collection Time: 03/23/18  6:25 AM  Result Value Ref Range   TSH 1.190 0.350 - 4.500 uIU/mL    Comment: Performed by a 3rd Generation assay with a functional sensitivity of <=0.01 uIU/mL. Performed at [facility], [place] [address], [place], [place] [address]   Lipid panel     Status: Abnormal   Collection Time: 03/23/18  6:25 AM  Result Value Ref Range   Cholesterol 222 (H) 0 - 200 mg/dL   Triglycerides 83 <150 mg/dL   HDL 59 >40 mg/dL   Total CHOL/HDL Ratio 3.8 RATIO   VLDL 17 0 - 40 mg/dL   LDL Cholesterol 146 (H) 0 - 99 mg/dL    Comment:        Total Cholesterol/HDL:CHD Risk Coronary Heart Disease Risk Table                     Men   Women  1/2 Average Risk   3.4   3.3  Average Risk       5.0   4.4  2 X Average Risk   9.6   7.1  3 X Average Risk  23.4   11.0        Use the calculated Patient Ratio above and the CHD Risk Table to determine the patient's CHD Risk.        ATP III CLASSIFICATION (LDL):  <100     mg/dL   Optimal  100-129  mg/dL   Near or Above                    Optimal  130-159  mg/dL   Borderline  160-189  mg/dL   High  >190     mg/dL   Very High Performed at [facility] [address], [place], [place] [address]   Hemoglobin A1c     Status: None   Collection Time:  03/23/18  6:25 AM  Result Value Ref Range   Hgb A1c MFr Bld 5.3 4.8 - 5.6 %    Comment: (NOTE) Pre diabetes:          5.7%-6.4% Diabetes:              >6.4% Glycemic control for   <7.0% adults with diabetes    Mean Plasma Glucose 105.41 mg/dL    Comment: Performed at [facility] [address], [place], [place] [address]    Blood Alcohol level:  Lab Results  Component Value Date   ETH <10 03/21/2018   ETH <10 19/41/7408    Metabolic Disorder Labs: Lab Results  Component Value Date   HGBA1C 5.3 03/23/2018   MPG 105.41 03/23/2018   MPG 99.67 07/31/2017   Lab Results  Component Value Date   PROLACTIN 17.5 07/31/2017   PROLACTIN 18.7 01/04/2017   Lab Results  Component Value Date   CHOL 222 (H) 03/23/2018   TRIG 83 03/23/2018   HDL 59 03/23/2018   CHOLHDL 3.8 03/23/2018   VLDL 17 03/23/2018   LDLCALC 146 (H) 03/23/2018   LDLCALC 89  01/04/2017    Physical Findings: AIMS: Facial and Oral Movements Muscles of Facial Expression: None, normal Lips and Perioral Area: None, normal Jaw: None, normal Tongue: None, normal,Extremity Movements Upper (arms, wrists, hands, fingers): None, normal Lower (legs, knees, ankles, toes): None, normal, Trunk Movements Neck, shoulders, hips: None, normal, Overall Severity Severity of abnormal movements (highest score from questions above): None, normal Incapacitation due to abnormal movements: None, normal Patient's awareness of abnormal movements (rate only patient's report): No Awareness, Dental Status Current problems with teeth and/or dentures?: No Does patient usually wear dentures?: No  CIWA:    COWS:     Musculoskeletal: Strength & Muscle Tone: within normal limits Gait & Station: normal Patient leans: N/A  Psychiatric Specialty Exam: Physical Exam  Nursing note and vitals reviewed.   Review of Systems  Constitutional: Negative for chills and fever.  Respiratory: Negative for cough and shortness of breath.    Cardiovascular: Negative for chest pain.  Gastrointestinal: Negative for abdominal pain, heartburn, nausea and vomiting.  Psychiatric/Behavioral: Positive for hallucinations. Negative for depression and suicidal ideas. The patient is not nervous/anxious and does not have insomnia.     Blood pressure 136/89, pulse (!) 118, temperature 98.3 F (36.8 C), temperature source Oral, resp. rate 20, height 5' 6.5" (1.689 m), weight 102.1 kg (225 lb), last menstrual period 03/07/2018.Body mass index is 35.77 kg/m.  General Appearance: Casual and Disheveled  Eye Contact:  Good  Speech:  Clear and Coherent and Normal Rate  Volume:  Normal  Mood:  Anxious and Depressed  Affect:  Congruent and Flat  Thought Process:  Coherent and Goal Directed  Orientation:  Full (Time, Place, and Person)  Thought Content:  Hallucinations: Auditory Command:  to hurt self  Suicidal Thoughts:  No  Homicidal Thoughts:  No  Memory:  Immediate;   Fair Recent;   Fair Remote;   Fair  Judgement:  Poor  Insight:  Shallow  Psychomotor Activity:  Normal  Concentration:  Concentration: Fair  Recall:  [employer] of Knowledge:  Fair  Language:  Fair  Akathisia:  No  Handed:    AIMS (if indicated):     Assets:  Resilience Social Support  ADL's:  Intact  Cognition:  WNL  Sleep:  Number of Hours: 6.75   Treatment Plan Summary: Daily contact with patient to assess and evaluate symptoms and progress in treatment and Medication management   -Continue inpatient hospitalization  -Schizoaffective disorder, depressive type   -Continue abilify 5mg  po qDay  -Start Abilify Maintena 400mg  IM q28 days (administer today 03/23/18)   -Continue zoloft 100mg  po qday   -Continue lamictal 25mg  po qDay  -Anxiety    -Continue vistaril 25mg  po q6h prn anxiety  - Insomnia  -Continue trazodone 50mg  po qhs prn insomnia  -HTN   -Continue amlodipine 10mg  po qday   -Continue HCTZ 25mg  po qDay  -Encourage participation in groups and  therapeutic milieu  -Disposition planning will be ongoing  Pennelope Bracken, MD 03/23/2018, 12:42 PM

## 2018-03-23 NOTE — [facility] Group Notes (Signed)
LCSW Group Therapy Note   03/23/2018 1:15pm   Type of Therapy and Topic:  Group Therapy:  Overcoming Obstacles   Participation Level:  Minimal   Description of Group:    In this group patients will be encouraged to explore what they see as obstacles to their own wellness and recovery. They will be guided to discuss their thoughts, feelings, and behaviors related to these obstacles. The group will process together ways to cope with barriers, with attention given to specific choices patients can make. Each patient will be challenged to identify changes they are motivated to make in order to overcome their obstacles. This group will be process-oriented, with patients participating in exploration of their own experiences as well as giving and receiving support and challenge from other group members.   Therapeutic Goals: 1. Patient will identify personal and current obstacles as they relate to admission. 2. Patient will identify barriers that currently interfere with their wellness or overcoming obstacles.  3. Patient will identify feelings, thought process and behaviors related to these barriers. 4. Patient will identify two changes they are willing to make to overcome these obstacles:      Summary of Patient Progress  Stayed the entire time, engaged throughout.  Minimal contributions and insight.  "My biggest obstacle is being depressed.  It's just been going on a couple of days.  I think it's getting a Aure better."    Therapeutic Modalities:   Cognitive Behavioral Therapy Solution Focused Therapy Motivational Interviewing Relapse Prevention Therapy  Trish Mage, LCSW 03/23/2018 3:24 PM

## 2018-03-23 NOTE — Progress Notes (Signed)
Patient ID: Sarah [name], female   DOB: Feb 11, 1976, 42 y.o.   MRN: [id number]  Nursing Progress Note [id number]  Data: Patient presents with flat affect and depressed mood. Patient complaint with scheduled medications. Patient denies pain/physical complaints.Patient provided but declined to complete their self-inventory sheet. Patient currently denies SI/HI but reports on-going AVH. Patient is isolative to her room but denies concerns at this time.   Action: Patient educated about and provided medication per provider's orders. Patient safety maintained with q15 min safety checks and frequent rounding. Low fall risk precautions in place. Emotional support given. 1:1 interaction and active listening provided. Patient encouraged to attend meals and groups. Patient encouraged to work on treatment plan and goals. Labs, vital signs and patient behavior monitored throughout shift. Abilify Maintena administered as order.  Response: Patient agrees to come to staff if any thoughts of SI/HI develop or if patient develops intention of acting on thoughts. Patient remains safe on the unit at this time. Patient is interacting with peers appropriately on the unit. Will continue to support and monitor.

## 2018-03-23 NOTE — Tx Team (Signed)
Interdisciplinary Treatment and Diagnostic Plan Update  03/23/2018 Time of Session: 10:21 AM  Sarah [name] MRN: [id number]  Principal Diagnosis: Schizoaffective disorder, depressive type ([place])  Secondary Diagnoses: Principal Problem:   Schizoaffective disorder, depressive type ([place])   Current Medications:  Current Facility-Administered Medications  Medication Dose Route Frequency Provider Last Rate Last Dose  . acetaminophen (TYLENOL) tablet 650 mg  650 mg Oral Q6H PRN [name], NP      . alum & mag hydroxide-simeth (MAALOX/MYLANTA) 200-200-20 MG/5ML suspension 30 mL  30 mL Oral Q4H PRN [name], NP      . amLODipine (NORVASC) tablet 10 mg  10 mg Oral Daily [name], NP   10 mg at 03/23/18 0751  . ARIPiprazole (ABILIFY) tablet 5 mg  5 mg Oral Daily [name], [name], MD   5 mg at 03/23/18 0751  . hydrochlorothiazide (HYDRODIURIL) tablet 25 mg  25 mg Oral Daily [name], NP   25 mg at 03/23/18 0254  . hydrOXYzine (ATARAX/VISTARIL) tablet 25 mg  25 mg Oral Q6H PRN [name], NP   25 mg at 03/22/18 2151  . lamoTRIgine (LAMICTAL) tablet 25 mg  25 mg Oral Daily [name], [name], MD   25 mg at 03/23/18 0751  . magnesium hydroxide (MILK OF MAGNESIA) suspension 30 mL  30 mL Oral Daily PRN [name], NP      . sertraline (ZOLOFT) tablet 100 mg  100 mg Oral Daily [name], NP   100 mg at 03/23/18 0751  . traZODone (DESYREL) tablet 50 mg  50 mg Oral QHS PRN [name], [name], MD   50 mg at 03/22/18 2151    PTA Medications: Medications Prior to Admission  Medication Sig Dispense Refill Last Dose  . amLODipine (NORVASC) 10 MG tablet Take 1 tablet (10 mg total) by mouth daily. For high blood pressure 10 tablet 0 03/21/2018 at Unknown time  . ARIPiprazole ER 400 MG SRER Inject 400 mg into the muscle every 21 ( twenty-one) days. (Patient taking differently: Inject 400 mg into the muscle every 28 (twenty-eight) days. ) 1 each 0 Past Month at Unknown  time  . hydrochlorothiazide (HYDRODIURIL) 25 MG tablet Take 1 tablet (25 mg total) by mouth daily. For high blood pressure 10 tablet 0 03/21/2018 at Unknown time  . Cholecalciferol (VITAMIN D PO) Take 1 capsule by mouth daily.   Unknown at Unknown time  . hydrOXYzine (ATARAX/VISTARIL) 25 MG tablet Take 1 tablet (25 mg total) by mouth every 6 (six) hours as needed for anxiety. 90 tablet 0 Unknown at Unknown time  . lamoTRIgine (LAMICTAL) 25 MG tablet Take 25 mg by mouth daily.    Unknown at Unknown time  . nicotine polacrilex (NICORETTE) 2 MG gum Take 1 each (2 mg total) by mouth as needed for smoking cessation. (Patient not taking: Reported on 02/11/2018) 100 tablet 0 Unknown at Unknown time  . sertraline (ZOLOFT) 100 MG tablet Take 1 tablet (100 mg total) by mouth daily. 30 tablet 0 Unknown at Unknown time  . traZODone (DESYREL) 150 MG tablet Take 1 tablet (150 mg total) by mouth at bedtime as needed for sleep. 30 tablet 0 Unknown at Unknown time    Patient Stressors: Health problems  Patient Strengths: Ability for insight General fund of knowledge  Treatment Modalities: Medication Management, Group therapy, Case management,  1 to 1 session with clinician, Psychoeducation, Recreational therapy.   Physician Treatment Plan for Primary Diagnosis: Schizoaffective  disorder, depressive type ([name]) Long Term Goal(s): Improvement in symptoms so as ready for discharge  Short Term Goals: Ability to identify changes in lifestyle to reduce recurrence of condition will improve Ability to verbalize feelings will improve Ability to disclose and discuss suicidal ideas Ability to demonstrate self-control will improve Ability to demonstrate self-control will improve Ability to maintain clinical measurements within normal limits will improve  Medication Management: Evaluate patient's response, side effects, and tolerance of medication regimen.  Therapeutic Interventions: 1 to 1 sessions, Unit Group sessions  and Medication administration.  Evaluation of Outcomes: Progressing  Physician Treatment Plan for Secondary Diagnosis: Principal Problem:   Schizoaffective disorder, depressive type ([name])   Long Term Goal(s): Improvement in symptoms so as ready for discharge  Short Term Goals: Ability to identify changes in lifestyle to reduce recurrence of condition will improve Ability to verbalize feelings will improve Ability to disclose and discuss suicidal ideas Ability to demonstrate self-control will improve Ability to demonstrate self-control will improve Ability to maintain clinical measurements within normal limits will improve  Medication Management: Evaluate patient's response, side effects, and tolerance of medication regimen.  Therapeutic Interventions: 1 to 1 sessions, Unit Group sessions and Medication administration.  Evaluation of Outcomes: Progressing   RN Treatment Plan for Primary Diagnosis: Schizoaffective disorder, depressive type ([name]) Long Term Goal(s): Knowledge of disease and therapeutic regimen to maintain health will improve  Short Term Goals: Ability to identify and develop effective coping behaviors will improve and Compliance with prescribed medications will improve  Medication Management: RN will administer medications as ordered by provider, will assess and evaluate patient's response and provide education to patient for prescribed medication. RN will report any adverse and/or side effects to prescribing provider.  Therapeutic Interventions: 1 on 1 counseling sessions, Psychoeducation, Medication administration, Evaluate responses to treatment, Monitor vital signs and CBGs as ordered, Perform/monitor CIWA, COWS, AIMS and Fall Risk screenings as ordered, Perform wound care treatments as ordered.  Evaluation of Outcomes: Progressing   LCSW Treatment Plan for Primary Diagnosis: Schizoaffective disorder, depressive type ([name]) Long Term Goal(s): Safe transition to  appropriate next level of care at discharge, Engage patient in therapeutic group addressing interpersonal concerns.  Short Term Goals: Engage patient in aftercare planning with referrals and resources  Therapeutic Interventions: Assess for all discharge needs, 1 to 1 time with Social worker, Explore available resources and support systems, Assess for adequacy in community support network, Educate family and significant other(s) on suicide prevention, Complete Psychosocial Assessment, Interpersonal group therapy.  Evaluation of Outcomes: Met  Return home, follow up [facility]   Progress in Treatment: Attending groups: No Participating in groups: No Taking medication as prescribed: Yes Toleration medication: Yes, no side effects reported at this time Family/Significant other contact made: Yes  Patient understands diagnosis: No Limited insight Discussing patient identified problems/goals with staff: Yes Medical problems stabilized or resolved: Yes Denies suicidal/homicidal ideation: Yes Issues/concerns per patient self-inventory: None Other: N/A  New problem(s) identified: None identified at this time.   New Short Term/Long Term Goal(s): "I want to learn some coping skills."  Discharge Plan or Barriers:   Reason for Continuation of Hospitalization:  Depression Hallucinations/Command in nature Medication stabilization Suicidal Ideation/attemp  Estimated Length of Stay: 6/21  Attendees: Patient: Sarah [name] 03/23/2018  10:21 AM  Physician: Maris Berger, MD 03/23/2018  10:21 AM  Nursing: Baldo Daub RN 03/23/2018  10:21 AM  RN Care Manager: Lars Pinks, RN 03/23/2018  10:21 AM  Social Worker: Ripley Fraise 03/23/2018  10:21  AM  Recreational Therapist: Winfield Cunas 03/23/2018  10:21 AM  Other: Norberto Sorenson 03/23/2018  10:21 AM  Other:  03/23/2018  10:21 AM    Scribe for Treatment Team:  Roque Lias LCSW 03/23/2018 10:21 AM

## 2018-03-23 NOTE — Plan of Care (Signed)
Problem: Safety: Goal: Periods of time without injury will increase Intervention: Patient contracts for safety on the unit. Low fall risk precautions in place. Safety monitored with q15 minute checks. Outcome: Patient remains safe on the unit at this time. 03/23/2018 3:23 PM - Progressing by Annia Friendly, RN

## 2018-03-24 NOTE — Progress Notes (Signed)
[facility] MD Progress Note  03/24/2018 4:16 PM Sarah [name]  MRN:  [id number]  Subjective: Sarah [name] reports, "I'm doing better today. I was feeling very depressed, I took sometime for myself & every body started to freak out. I also took some pills to hurt myself because I was feeling very depressed. Since coming in here, I have started to go to groups again & it is helping me. I'm doing good".    Sarah [name] is a 42 y/o F with history of schizoaffective disorder depressive type who was admitted voluntarily from [facility] with worsening depression, CAH to kill herself, and overdose of 15 tablets of OTC analgesics (mix of tylenol and advil). Pt was medically cleared and then transferred to [facility] for additional treatment and stabilization. She was restarted on home medications of abilify (oral), lamictal, and zoloft.   Today upon evaluation, pt shares about her admission, "I was feeling depressed and I tried to kill myself." Pt denies any specific stressors which resulted in her suicide attempt. She adds, "I woke up and I didn't want to be here any more." Pt reports having SI for about 3 days prior to her hospitalization. She denies SI/HI today. She denies any AVH today. She says she is sleeping well. She denies physical complaints. Collateral information was previously obtained by the SW team that pt has ACT team with whom she has been out of contact for the past 2 weeks, and pt missed her injection of abilify maintena for which she was due on 03/12/18. Pt shares she was staying with her sister, and it was not her intention to be out of contact with ACT team. She is in agreement & received the Abilify Maintena injection yesterday and to continue her other medications without changes. She is in agreement with the above plan, and she had no further questions, comments, or concerns.  Principal Problem: Schizoaffective disorder, depressive type ([place]) Diagnosis:   Patient Active Problem List   Diagnosis Date Noted   . Schizoaffective disorder, depressive type ([place]) [F25.1] 01/15/2012    Priority: High    Class: Acute  . Schizoaffective disorder ([place]) [F25.9] 07/29/2017  . MDD (major depressive disorder), recurrent episode, severe ([place]) [F33.2] 02/19/2017  . Schizoaffective disorder, bipolar type ([place]) [F25.0] 01/06/2017  . 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 IIH (idiopathic intracranial hypertension) [G93.2] 01/24/2016  . Cannabis use disorder, moderate, dependence ([place]) [F12.20] 08/18/2015   Total Time spent with patient: 15 minutes  Past Psychiatric History: See H&P  Past Medical History:  Past Medical History:  Diagnosis Date  . Anemia   . Anxiety   . Depression   . Elevated bilirubin    slight elevation at 1.3  . Hypertension   . Hypokalemia   . Intracranial hypertension   . Migraines   . Mild intellectual disability   . Schizoaffective disorder [facility])     Past Surgical History:  Procedure Laterality Date  . NO PAST SURGERIES     Family History:  Family History  Problem Relation Age of Onset  . Schizophrenia Mother   . Hypertension Mother   . Anesthesia problems Neg Hx   . Malignant hyperthermia Neg Hx   . Pseudochol deficiency Neg Hx   . Hypotension Neg Hx   . Migraines Neg Hx    Family Psychiatric  History: See H&P Social History:  Social History   Substance and Sexual Activity  Alcohol Use No   Comment: denied all drug/alcohol use     Social History  Substance and Sexual Activity  Drug Use Yes  . Types: Marijuana   Comment: denied all drug/alcohol use    Social History   Socioeconomic History  . Marital status: Single    Spouse name: Not on file  . Number of children: 0  . Years of education: 19  . Highest education level: Not on file  Occupational History  . Occupation: Unemployed  Social Needs  . Financial resource strain: Not on file  . Food insecurity:    Worry: Not on file    Inability: Not on file  . Transportation needs:     Medical: Not on file    Non-medical: Not on file  Tobacco Use  . Smoking status: Current Every Day Smoker    Packs/day: 1.00    Years: 15.00    Pack years: 15.00    Types: Cigarettes    Last attempt to quit: 01/02/2017    Years since quitting: 1.2  . Smokeless tobacco: Never Used  . Tobacco comment: smoked since 42 yrs old  Substance and Sexual Activity  . Alcohol use: No    Comment: denied all drug/alcohol use  . Drug use: Yes    Types: Marijuana    Comment: denied all drug/alcohol use  . Sexual activity: Yes    Birth control/protection: None  Lifestyle  . Physical activity:    Days per week: Not on file    Minutes per session: Not on file  . Stress: Not on file  Relationships  . Social connections:    Talks on phone: Not on file    Gets together: Not on file    Attends religious service: Not on file    Active member of club or organization: Not on file    Attends meetings of clubs or organizations: Not on file    Relationship status: Not on file  Other Topics Concern  . Not on file  Social History Narrative   Lives with fiance, Erlene Quan   Caffeine use: Soda: 1 soda daily   No coffee   No tea   Additional Social History:    Pain Medications: none Prescriptions: none Over the Counter: none History of alcohol / drug use?: No history of alcohol / drug abuse  Sleep: Good  Appetite:  Good  Current Medications: Current Facility-Administered Medications  Medication Dose Route Frequency Provider Last Rate Last Dose  . acetaminophen (TYLENOL) tablet 650 mg  650 mg Oral Q6H PRN [name], NP      . alum & mag hydroxide-simeth (MAALOX/MYLANTA) 200-200-20 MG/5ML suspension 30 mL  30 mL Oral Q4H PRN [name], NP      . amLODipine (NORVASC) tablet 10 mg  10 mg Oral Daily [name], NP   10 mg at 03/24/18 0926  . ARIPiprazole (ABILIFY) tablet 5 mg  5 mg Oral Daily [name], [name], MD   5 mg at 03/24/18 0928  . ARIPiprazole ER (ABILIFY MAINTENA)  injection 400 mg  400 mg Intramuscular Q28 days Pennelope Bracken, MD   400 mg at 03/23/18 1702  . hydrochlorothiazide (HYDRODIURIL) tablet 25 mg  25 mg Oral Daily [name], NP   25 mg at 03/24/18 0926  . hydrOXYzine (ATARAX/VISTARIL) tablet 25 mg  25 mg Oral Q6H PRN [name], NP   25 mg at 03/23/18 2055  . lamoTRIgine (LAMICTAL) tablet 25 mg  25 mg Oral Daily [name], [name], MD   25 mg at 03/24/18 0927  . magnesium  hydroxide (MILK OF MAGNESIA) suspension 30 mL  30 mL Oral Daily PRN [name], NP      . sertraline (ZOLOFT) tablet 100 mg  100 mg Oral Daily [name], NP   100 mg at 03/24/18 6967  . traZODone (DESYREL) tablet 50 mg  50 mg Oral QHS PRN [name], [name], MD   50 mg at 03/23/18 2055   Lab Results:  Results for orders placed or performed during the hospital encounter of 03/21/18 (from the past 48 hour(s))  TSH     Status: None   Collection Time: 03/23/18  6:25 AM  Result Value Ref Range   TSH 1.190 0.350 - 4.500 uIU/mL    Comment: Performed by a 3rd Generation assay with a functional sensitivity of <=0.01 uIU/mL. Performed at [facility], [place] [address], [place], [place] [address]   Lipid panel     Status: Abnormal   Collection Time: 03/23/18  6:25 AM  Result Value Ref Range   Cholesterol 222 (H) 0 - 200 mg/dL   Triglycerides 83 <150 mg/dL   HDL 59 >40 mg/dL   Total CHOL/HDL Ratio 3.8 RATIO   VLDL 17 0 - 40 mg/dL   LDL Cholesterol 146 (H) 0 - 99 mg/dL    Comment:        Total Cholesterol/HDL:CHD Risk Coronary Heart Disease Risk Table                     Men   Women  1/2 Average Risk   3.4   3.3  Average Risk       5.0   4.4  2 X Average Risk   9.6   7.1  3 X Average Risk  23.4   11.0        Use the calculated Patient Ratio above and the CHD Risk Table to determine the patient's CHD Risk.        ATP III CLASSIFICATION (LDL):  <100     mg/dL   Optimal  100-129  mg/dL   Near or Above                     Optimal  130-159  mg/dL   Borderline  160-189  mg/dL   High  >190     mg/dL   Very High Performed at [facility] [address], [place], [place] [address]   Hemoglobin A1c     Status: None   Collection Time: 03/23/18  6:25 AM  Result Value Ref Range   Hgb A1c MFr Bld 5.3 4.8 - 5.6 %    Comment: (NOTE) Pre diabetes:          5.7%-6.4% Diabetes:              >6.4% Glycemic control for   <7.0% adults with diabetes    Mean Plasma Glucose 105.41 mg/dL    Comment: Performed at [facility] [address], [place], [place] [address]   Blood Alcohol level:  Lab Results  Component Value Date   [facility] <10 03/21/2018   ETH <10 27/78/2423    Metabolic Disorder Labs: Lab Results  Component Value Date   HGBA1C 5.3 03/23/2018   MPG 105.41 03/23/2018   MPG 99.67 07/31/2017   Lab Results  Component Value Date   PROLACTIN 17.5 07/31/2017   PROLACTIN 18.7 01/04/2017   Lab Results  Component Value Date   CHOL 222 (H) 03/23/2018  TRIG 83 03/23/2018   HDL 59 03/23/2018   CHOLHDL 3.8 03/23/2018   VLDL 17 03/23/2018   LDLCALC 146 (H) 03/23/2018   LDLCALC 89 01/04/2017   Physical Findings: AIMS: Facial and Oral Movements Muscles of Facial Expression: None, normal Lips and Perioral Area: None, normal Jaw: None, normal Tongue: None, normal,Extremity Movements Upper (arms, wrists, hands, fingers): None, normal Lower (legs, knees, ankles, toes): None, normal, Trunk Movements Neck, shoulders, hips: None, normal, Overall Severity Severity of abnormal movements (highest score from questions above): None, normal Incapacitation due to abnormal movements: None, normal Patient's awareness of abnormal movements (rate only patient's report): No Awareness, Dental Status Current problems with teeth and/or dentures?: No Does patient usually wear dentures?: No  CIWA:    COWS:     Musculoskeletal: Strength & Muscle Tone: within normal limits Gait & Station:  normal Patient leans: N/A  Psychiatric Specialty Exam: Physical Exam  Nursing note and vitals reviewed.   Review of Systems  Constitutional: Negative for chills and fever.  Respiratory: Negative for cough and shortness of breath.   Cardiovascular: Negative for chest pain.  Gastrointestinal: Negative for abdominal pain, heartburn, nausea and vomiting.  Psychiatric/Behavioral: Positive for hallucinations. Negative for depression and suicidal ideas. The patient is not nervous/anxious and does not have insomnia.     Blood pressure (!) 139/100, pulse (!) 104, temperature 98.5 F (36.9 C), temperature source Oral, resp. rate 20, height 5' 6.5" (1.689 m), weight 102.1 kg (225 lb), last menstrual period 03/07/2018.Body mass index is 35.77 kg/m.  General Appearance: Casual and Disheveled  Eye Contact:  Good  Speech:  Clear and Coherent and Normal Rate  Volume:  Normal  Mood:  Anxious and Depressed  Affect:  Congruent and Flat  Thought Process:  Coherent and Goal Directed  Orientation:  Full (Time, Place, and Person)  Thought Content:  Hallucinations: Auditory Command:  to hurt self  Suicidal Thoughts:  No  Homicidal Thoughts:  No  Memory:  Immediate;   Fair Recent;   Fair Remote;   Fair  Judgement:  Poor  Insight:  Shallow  Psychomotor Activity:  Normal  Concentration:  Concentration: Fair  Recall:  [employer] of Knowledge:  Fair  Language:  Fair  Akathisia:  No  Handed:    AIMS (if indicated):     Assets:  Resilience Social Support  ADL's:  Intact  Cognition:  WNL  Sleep:  Number of Hours: 6.75   Treatment Plan Summary: Daily contact with patient to assess and evaluate symptoms and progress in treatment and Medication management   -Continue inpatient hospitalization.  -Will continue today 03/24/2018 plan as below except where it is noted.  -Schizoaffective disorder, depressive type   -Continue abilify 5mg  po qDay  -Continue Abilify Maintena 400mg  IM q28 days  (administered on 03/23/18)   -Continue zoloft 100mg  po qday   -Continue lamictal 25mg  po qDay  -Anxiety    -Continue vistaril 25mg  po q6h prn anxiety  - Insomnia  -Continue trazodone 50mg  po qhs prn insomnia  -HTN   -Continue amlodipine 10mg  po qday   -Continue HCTZ 25mg  po qDay  -Encourage participation in groups and therapeutic milieu  -Disposition planning will be ongoing  Lindell Spar, NP, PMHNP, FNP-BC. 03/24/2018, 4:16 PMPatient ID: Sarah [name], female   DOB: [birth year], 42 y.o.   MRN: [id number]

## 2018-03-24 NOTE — [facility] Group Notes (Signed)
LCSW Group Therapy Note   03/24/2018 1:15pm   Type of Therapy and Topic:  Group Therapy:  Positive Affirmations   Participation Level:  Minimal  Description of Group: This group addressed positive affirmation toward self and others. Patients went around the room and identified two positive things about themselves and two positive things about a peer in the room. Patients reflected on how it felt to share something positive with others, to identify positive things about themselves, and to hear positive things from others. Patients were encouraged to have a daily reflection of positive characteristics or circumstances.  Therapeutic Goals 1. Patient will verbalize two of their positive qualities 2. Patient will demonstrate empathy for others by stating two positive qualities about a peer in the group 3. Patient will verbalize their feelings when voicing positive self affirmations and when voicing positive affirmations of others 4. Patients will discuss the potential positive impact on their wellness/recovery of focusing on positive traits of self and others. Summary of Patient Progress:  Stayed the entire time, engaged throughout.  "I lost my grandmother Ivor Costa about 10 years ago.  That was hard.  She raised me.  If she was here, she would tell me to "get better, and take care of yourself."  When asked what taking care of herself looked like, she said "learning coping skills."  Therapeutic Modalities Cognitive [place], LCSW 03/24/2018 2:46 PM

## 2018-03-24 NOTE — Plan of Care (Signed)
  Problem: Activity: Goal: Sleeping patterns will improve Outcome: Progressing Note:  Pt slept 6.75 hours last night.   

## 2018-03-24 NOTE — Progress Notes (Signed)
Adult Psychoeducational Group Note  Date:  03/24/2018 Time:  12:48 AM  Group Topic/Focus:  Wrap-Up Group:   The focus of this group is to help patients review their daily goal of treatment and discuss progress on daily workbooks.  Participation Level:  Active  Participation Quality:  Appropriate  Affect:  Appropriate  Cognitive:  Appropriate  Insight: Appropriate  Engagement in Group:  Engaged  Modes of Intervention:  Discussion  Additional Comments:  Pt stated her goal for today was to participate and program in all groups held today. Pt stated she felt that she accomplished her goal today. Pt rated her over all day and 8 out of 10. Pt stated that interacting with her peers today help improve her day.  Candy Sledge 03/24/2018, 12:48 AM

## 2018-03-24 NOTE — Progress Notes (Signed)
D: Pt was in bed in her room upon initial approach.  She presents with depressed, anxious affect and mood.  She reports her goal was to "go to groups" and that she met her goal.  Denies SI/HI, hallucinations, and pain.  Pt visible in milieu with few peer interactions.  She attended evening group.  A: Met with pt 1:1 and provided support and encouragement.  PRN medication administered for anxiety and sleep.  Q15 minute safety checks maintained.  R: Pt is compliant with medications.  She verbally contracts for safety and reports she will inform staff of needs and concerns.  Will continue to monitor and assess.

## 2018-03-24 NOTE — Progress Notes (Signed)
Adult Psychoeducational Group Note  Date:  03/24/2018 Time:  8:48 PM  Group Topic/Focus:  Wrap-Up Group:   The focus of this group is to help patients review their daily goal of treatment and discuss progress on daily workbooks.  Participation Level:  Active  Participation Quality:  Appropriate  Affect:  Appropriate  Cognitive:  Appropriate  Insight: Appropriate  Engagement in Group:  Engaged  Modes of Intervention:  Discussion  Additional Comments:  The patient expressed that she rates today a 8.The patient also said that she attended groups and her goal is to attend all groups.  Nash Shearer 03/24/2018, 8:48 PM

## 2018-03-24 NOTE — Plan of Care (Signed)
  Problem: Safety: Goal: Periods of time without injury will increase Outcome: Progressing   Problem: Self-Concept: Goal: Level of anxiety will decrease Outcome: Progressing   Problem: Health Behavior/Discharge Planning: Goal: Compliance with prescribed medication regimen will improve Outcome: Progressing  DAR NOTE: Patient presents with anxious affect and depressed mood.  Denies suicidal thoughts, pain, auditory and visual hallucinations.  Rates depression at 8, hopelessness at 0, and anxiety at 8.  Maintained on routine safety checks.  Medications given as prescribed.  Support and encouragement offered as needed.  Attended group and participated.  States goal for today is "attend group."  Patient is withdrawn and isolates to her room.  Offered no complaint.

## 2018-03-24 NOTE — Progress Notes (Signed)
Recreation Therapy Notes  INPATIENT RECREATION THERAPY ASSESSMENT  Patient Details Name: Sarah [name] MRN: [id number] DOB: 03-May-1976 Today's Date: 03/24/2018       Information Obtained From: Patient  Able to Participate in Assessment/Interview: Yes  Patient Presentation: Alert, Oriented  Reason for Admission (Per Patient): Suicide Attempt, Other (Comments)(Depression)  Patient Stressors: Family, Friends, Other (Comment)(Living situation)  Coping Skills:   Building control surveyor, Child psychotherapist, TV, Music, Exercise, Meditate, Talk, Prayer, Avoidance, Read, Hot Bath/Shower  Leisure Interests (2+):  Exercise - Walking, Individual - Writing  Frequency of Recreation/Participation: Weekly  Awareness of Community Resources:  Yes  Community Resources:  Park, Patent examiner  Current Use: Yes  If no, Barriers?:    Expressed Interest in [employer] Information: No  [place] of Residence:  [place]  Patient Main Form of Transportation: Diplomatic Services operational officer  Patient Strengths:  Cooking; Writing poems  Patient Identified Areas of Improvement:  Listen more; Talk problems out more  Patient Goal for Hospitalization:  "work on coping skills"  Current SI (including self-harm):  No  Current HI:  No  Current AVH: No  Staff Intervention Plan: Group Attendance, Collaborate with Interdisciplinary Treatment Team  Consent to Intern Participation: N/A    Victorino Sparrow, LRT/CTRS   Victorino Sparrow A 03/24/2018, 12:35 PM

## 2018-03-24 NOTE — Progress Notes (Signed)
Recreation Therapy Notes  Date: 6.18.19 Time: 1000 Location: 500 [address] Dayroom   Group Topic: Communication, Team Building, Problem Solving  Goal Area(s) Addresses:  Patient will effectively work with peer towards shared goal.  Patient will identify skills used to make activity successful.  Patient will identify how skills used during activity can be used to reach post d/c goals.   Intervention: STEM Activity  Activity: Geophysicist/field seismologist. In teams patients were given 12 plastic drinking straws and a length of masking tape. Using the materials provided patients were asked to build a landing pad to catch a golf ball dropped from approximately 6 feet in the air.   Education: Education officer, community, Discharge Planning   Education Outcome: Acknowledges education/In group clarification offered/Needs additional education.   Clinical Observations/Feedback: Pt did not attend group.    Victorino Sparrow, LRT/CTRS    Victorino Sparrow A 03/24/2018 11:54 AM

## 2018-03-25 NOTE — [facility] Suicide Risk Assessment (Signed)
[place] INPATIENT:  Family/Significant Other Suicide Prevention Education  Suicide Prevention Education:  Patient Refusal for Family/Significant Other Suicide Prevention Education: The patient Sarah [name] has refused to provide written consent for family/significant other to be provided Family/Significant Other Suicide Prevention Education during admission and/or prior to discharge.  Physician notified.  Trish Mage 03/25/2018, 2:13 PM

## 2018-03-25 NOTE — Progress Notes (Signed)
Recreation Therapy Notes  Date: 6.19.19 Time: 1000 Location: 500 Hall Dayroom  Group Topic: Leisure Education, Goal Setting  Goal Area(s) Addresses:  Patient will be able to identify at least 3 life goals.  Patient will be able to identify benefit of investing in life goals.  Patient will be able to identify benefit of setting life goals.   Intervention: Worksheet  Activity: Setting Life Goals.  Patients were to identify what they were doing well, what they needed to improve and set a goal in the areas of family, friends, spirituality, work/school, mental health and body.  Patients would then share their top 4 categories with the group.  Education: Discharge Planning, Radiographer, therapeutic, Leisure Education  Education Outcome: Acknowledges Education/In Group Clarification Provided/Needs Additional Education  Clinical Observations: Pt did not attend group.     Victorino Sparrow, LRT/CTRS      Victorino Sparrow A 03/25/2018 1:04 PM

## 2018-03-25 NOTE — Progress Notes (Signed)
[facility] MD Progress Note  03/25/2018 3:04 PM Sarah [name]  MRN:  [id number] Subjective:    Sarah [name] is a 42 y/o F with history of schizoaffective disorder depressive type who was admitted voluntarily from [facility] with worsening depression, CAH to kill herself, and overdose of 15 tablets of OTC analgesics (mix of tylenol and advil). Pt was medically cleared and then transferred to [facility] for additional treatment and stabilization. She was restarted on home medications of abilify (oral), lamictal, and zoloft. Collateral from pt's ACT team confirmed that she was due for abilify maintena injection on 6/6, so that was also administered during her admission. Pt reported incremental improvement of her presenting symptoms.  Today upon evaluation, pt shares, "I'm doing better. My depression is a [place] better." She reports intensity of depression as "8/10." She reports that AH have resolved completely, and she denies SI/HI/AH/VH. She is sleeping well. Her appetite is good. She denies other physical complaints. She is tolerating her current medications well, and she is in agreement to continue her current regimen without changes. She is in agreement with tentative plan for discharge to outpatient level of care tomorrow if she has ongoing improvement and stability of her current symptoms. Pt was in agreement with the above plan, and she had no further questions, comments, or concerns.   Principal Problem: Schizoaffective disorder, depressive type ([place]) Diagnosis:   Patient Active Problem List   Diagnosis Date Noted  . Schizoaffective disorder ([place]) [F25.9] 07/29/2017  . MDD (major depressive disorder), recurrent episode, severe ([place]) [F33.2] 02/19/2017  . Schizoaffective disorder, bipolar type ([place]) [F25.0] 01/06/2017  . 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 IIH (idiopathic intracranial hypertension) [G93.2] 01/24/2016  . Cannabis use disorder, moderate, dependence ([place]) [F12.20] 08/18/2015  .  Schizoaffective disorder, depressive type ([place]) [F25.1] 01/15/2012    Class: Acute   Total Time spent with patient: 30 minutes  Past Psychiatric History: see H&P  Past Medical History:  Past Medical History:  Diagnosis Date  . Anemia   . Anxiety   . Depression   . Elevated bilirubin    slight elevation at 1.3  . Hypertension   . Hypokalemia   . Intracranial hypertension   . Migraines   . Mild intellectual disability   . Schizoaffective disorder [facility])     Past Surgical History:  Procedure Laterality Date  . NO PAST SURGERIES     Family History:  Family History  Problem Relation Age of Onset  . Schizophrenia Mother   . Hypertension Mother   . Anesthesia problems Neg Hx   . Malignant hyperthermia Neg Hx   . Pseudochol deficiency Neg Hx   . Hypotension Neg Hx   . Migraines Neg Hx    Family Psychiatric  History: see H&P Social History:  Social History   Substance and Sexual Activity  Alcohol Use No   Comment: denied all drug/alcohol use     Social History   Substance and Sexual Activity  Drug Use Yes  . Types: Marijuana   Comment: denied all drug/alcohol use    Social History   Socioeconomic History  . Marital status: Single    Spouse name: Not on file  . Number of children: 0  . Years of education: 4  . Highest education level: Not on file  Occupational History  . Occupation: Unemployed  Social Needs  . Financial resource strain: Not on file  . Food insecurity:    Worry: Not on file    Inability: Not on file  .  Transportation needs:    Medical: Not on file    Non-medical: Not on file  Tobacco Use  . Smoking status: Current Every Day Smoker    Packs/day: 1.00    Years: 15.00    Pack years: 15.00    Types: Cigarettes    Last attempt to quit: 01/02/2017    Years since quitting: 1.2  . Smokeless tobacco: Never Used  . Tobacco comment: smoked since 42 yrs old  Substance and Sexual Activity  . Alcohol use: No    Comment: denied all drug/alcohol  use  . Drug use: Yes    Types: Marijuana    Comment: denied all drug/alcohol use  . Sexual activity: Yes    Birth control/protection: None  Lifestyle  . Physical activity:    Days per week: Not on file    Minutes per session: Not on file  . Stress: Not on file  Relationships  . Social connections:    Talks on phone: Not on file    Gets together: Not on file    Attends religious service: Not on file    Active member of club or organization: Not on file    Attends meetings of clubs or organizations: Not on file    Relationship status: Not on file  Other Topics Concern  . Not on file  Social History Narrative   Lives with fiance, Erlene Quan   Caffeine use: Soda: 1 soda daily   No coffee   No tea   Additional Social History:    Pain Medications: none Prescriptions: none Over the Counter: none History of alcohol / drug use?: No history of alcohol / drug abuse                    Sleep: Good  Appetite:  Good  Current Medications: Current Facility-Administered Medications  Medication Dose Route Frequency Provider Last Rate Last Dose  . acetaminophen (TYLENOL) tablet 650 mg  650 mg Oral Q6H PRN [name], NP      . alum & mag hydroxide-simeth (MAALOX/MYLANTA) 200-200-20 MG/5ML suspension 30 mL  30 mL Oral Q4H PRN [name], NP      . amLODipine (NORVASC) tablet 10 mg  10 mg Oral Daily [name], NP   10 mg at 03/25/18 0737  . ARIPiprazole (ABILIFY) tablet 5 mg  5 mg Oral Daily [name], [name], MD   5 mg at 03/25/18 0737  . ARIPiprazole ER (ABILIFY MAINTENA) injection 400 mg  400 mg Intramuscular Q28 days Pennelope Bracken, MD   400 mg at 03/23/18 1702  . hydrochlorothiazide (HYDRODIURIL) tablet 25 mg  25 mg Oral Daily [name], NP   25 mg at 03/25/18 0737  . hydrOXYzine (ATARAX/VISTARIL) tablet 25 mg  25 mg Oral Q6H PRN [name], NP   25 mg at 03/24/18 2106  . lamoTRIgine (LAMICTAL) tablet 25 mg  25 mg Oral Daily [name], [name],  MD   25 mg at 03/25/18 0737  . magnesium hydroxide (MILK OF MAGNESIA) suspension 30 mL  30 mL Oral Daily PRN [name], NP      . sertraline (ZOLOFT) tablet 100 mg  100 mg Oral Daily [name], NP   100 mg at 03/25/18 0737  . traZODone (DESYREL) tablet 50 mg  50 mg Oral QHS PRN [name], [name], MD   50 mg at 03/24/18 2106    Lab Results: No results found for this or any previous  visit (from the past 48 hour(s)).  Blood Alcohol level:  Lab Results  Component Value Date   ETH <10 03/21/2018   ETH <10 29/79/8921    Metabolic Disorder Labs: Lab Results  Component Value Date   HGBA1C 5.3 03/23/2018   MPG 105.41 03/23/2018   MPG 99.67 07/31/2017   Lab Results  Component Value Date   PROLACTIN 17.5 07/31/2017   PROLACTIN 18.7 01/04/2017   Lab Results  Component Value Date   CHOL 222 (H) 03/23/2018   TRIG 83 03/23/2018   HDL 59 03/23/2018   CHOLHDL 3.8 03/23/2018   VLDL 17 03/23/2018   LDLCALC 146 (H) 03/23/2018   LDLCALC 89 01/04/2017    Physical Findings: AIMS: Facial and Oral Movements Muscles of Facial Expression: None, normal Lips and Perioral Area: None, normal Jaw: None, normal Tongue: None, normal,Extremity Movements Upper (arms, wrists, hands, fingers): None, normal Lower (legs, knees, ankles, toes): None, normal, Trunk Movements Neck, shoulders, hips: None, normal, Overall Severity Severity of abnormal movements (highest score from questions above): None, normal Incapacitation due to abnormal movements: None, normal Patient's awareness of abnormal movements (rate only patient's report): No Awareness, Dental Status Current problems with teeth and/or dentures?: No Does patient usually wear dentures?: No  CIWA:    COWS:     Musculoskeletal: Strength & Muscle Tone: within normal limits Gait & Station: normal Patient leans: N/A  Psychiatric Specialty Exam: Physical Exam  Nursing note and vitals reviewed.   Review of Systems  Constitutional:  Negative for chills and fever.  Respiratory: Negative for cough and shortness of breath.   Cardiovascular: Negative for chest pain.  Gastrointestinal: Negative for abdominal pain, heartburn, nausea and vomiting.  Psychiatric/Behavioral: Positive for depression. Negative for hallucinations and suicidal ideas. The patient is not nervous/anxious and does not have insomnia.     Blood pressure 128/84, pulse 95, temperature 98.6 F (37 C), temperature source Oral, resp. rate 20, height 5' 6.5" (1.689 m), weight 102.1 kg (225 lb), last menstrual period 03/07/2018.Body mass index is 35.77 kg/m.  General Appearance: Casual and Disheveled  Eye Contact:  Good  Speech:  Clear and Coherent and Normal Rate  Volume:  Normal  Mood:  Euthymic  Affect:  Congruent  Thought Process:  Coherent and Goal Directed  Orientation:  Full (Time, Place, and Person)  Thought Content:  Logical  Suicidal Thoughts:  No  Homicidal Thoughts:  No  Memory:  Immediate;   Fair Recent;   Fair Remote;   Fair  Judgement:  Fair  Insight:  Lacking  Psychomotor Activity:  Normal  Concentration:  Concentration: Fair  Recall:  [employer] of Knowledge:  Fair  Language:  Fair  Akathisia:  No  Handed:    AIMS (if indicated):     Assets:  Desire for Improvement Housing Resilience Social Support  ADL's:  Intact  Cognition:  WNL  Sleep:  Number of Hours: 6.75    Treatment Plan Summary: Daily contact with patient to assess and evaluate symptoms and progress in treatment and Medication management   -Continue inpatient hospitalization.  -Schizoaffective disorder, depressive type             -Continue abilify 5mg  po qDay             -Continue Abilify Maintena 400mg  IM q28 days (administered on 03/23/18)             -Continue zoloft 100mg  po qday             -Continue  lamictal 25mg  po qDay  -Anxiety             -Continue vistaril 25mg  po q6h prn anxiety  - Insomnia             -Continue trazodone 50mg  po qhs prn  insomnia  -HTN              -Continue amlodipine 10mg  po qday             -Continue HCTZ 25mg  po qDay  -Encourage participation in groups and therapeutic milieu  -Disposition planning will be ongoing  Pennelope Bracken, MD 03/25/2018, 3:04 PM

## 2018-03-25 NOTE — Progress Notes (Signed)
The patient described her day as having been a 10 out of a possible 10. She states that she enjoyed attending group. Her goal for tomorrow is to inquire about getting discharged.

## 2018-03-25 NOTE — [facility] Group Notes (Signed)
LCSW Group Therapy Note  03/25/2018 1:15pm  Type of Therapy/Topic:  Group Therapy:  Balance in Life  Participation Level:  Minimal  Description of Group:    This group will address the concept of balance and how it feels and looks when one is unbalanced. Patients will be encouraged to process areas in their lives that are out of balance and identify reasons for remaining unbalanced. Facilitators will guide patients in utilizing problem-solving interventions to address and correct the stressor making their life unbalanced. Understanding and applying boundaries will be explored and addressed for obtaining and maintaining a balanced life. Patients will be encouraged to explore ways to assertively make their unbalanced needs known to significant others in their lives, using other group members and facilitator for support and feedback.  Therapeutic Goals: 1. Patient will identify two or more emotions or situations they have that consume much of in their lives. 2. Patient will identify signs/triggers that life has become out of balance:  3. Patient will identify two ways to set boundaries in order to achieve balance in their lives:  4. Patient will demonstrate ability to communicate their needs through discussion and/or role plays  Summary of Patient Progress:  Stayed the entire time, engaged throughout.  "I write poetry when I am angry.  It helps calm me."    Therapeutic Modalities:   Cognitive Behavioral Therapy Solution-Focused Therapy Assertiveness Training  Trish Mage, LCSW 03/25/2018 2:10 PM

## 2018-03-26 MED ORDER — AMLODIPINE BESYLATE 10 MG PO TABS: 10.0000 mg | ORAL_TABLET | Freq: Every day | ORAL | 0 refills | Status: AC

## 2018-03-26 MED ORDER — ARIPIPRAZOLE 5 MG PO TABS: 5.0000 mg | ORAL_TABLET | Freq: Every day | ORAL | 0 refills | Status: AC

## 2018-03-26 MED ORDER — TRAZODONE HCL 50 MG PO TABS: 50.0000 mg | ORAL_TABLET | Freq: Every evening | ORAL | 0 refills | Status: AC | PRN

## 2018-03-26 MED ORDER — ARIPIPRAZOLE ER 400 MG IM SRER: 400.0000 mg | INTRAMUSCULAR | 0 refills | Status: AC

## 2018-03-26 MED ORDER — HYDROXYZINE HCL 25 MG PO TABS: 25.0000 mg | ORAL_TABLET | Freq: Four times a day (QID) | ORAL | 0 refills | Status: AC | PRN

## 2018-03-26 MED ORDER — SERTRALINE HCL 100 MG PO TABS: 100.0000 mg | ORAL_TABLET | Freq: Every day | ORAL | 0 refills | Status: AC

## 2018-03-26 MED ORDER — LAMOTRIGINE 25 MG PO TABS: 25.0000 mg | ORAL_TABLET | Freq: Every day | ORAL | 0 refills | Status: AC

## 2018-03-26 MED ORDER — HYDROCHLOROTHIAZIDE 25 MG PO TABS: 25.0000 mg | ORAL_TABLET | Freq: Every day | ORAL | 0 refills | Status: AC

## 2018-03-26 NOTE — Discharge Summary (Addendum)
Physician Discharge Summary Note  Patient:  Sarah [name] is an 42 y.o., female  MRN:  [id number]  DOB:  24-Jan-1976  Patient phone:  [phone] (home)   Patient address:   [address] [place] [address],   Total Time spent with patient: Greater than 30 minutes  Date of Admission:  03/21/2018  Date of Discharge: 03-26-18  Reason for Admission: Worsening depression, CAH to kill herself, and overdose of 15 tablets of OTC analgesics (mix of tylenol and advil).    Principal Problem: Schizoaffective disorder, depressive-type.  Discharge Diagnoses: Patient Active Problem List   Diagnosis Date Noted  . Schizoaffective disorder, depressive type ([place]) [F25.1] 01/15/2012    Priority: High    Class: Acute  . Schizoaffective disorder ([place]) [F25.9] 07/29/2017  . MDD (major depressive disorder), recurrent episode, severe ([place]) [F33.2] 02/19/2017  . Schizoaffective disorder, bipolar type ([place]) [F25.0] 01/06/2017  . 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 IIH (idiopathic intracranial hypertension) [G93.2] 01/24/2016  . Cannabis use disorder, moderate, dependence ([place]) [F12.20] 08/18/2015   Past Psychiatric History: Schizoaffective disorder, depressive-type, Cannabis use disorder.  Past Medical History:  Past Medical History:  Diagnosis Date  . Anemia   . Anxiety   . Depression   . Elevated bilirubin    slight elevation at 1.3  . Hypertension   . Hypokalemia   . Intracranial hypertension   . Migraines   . Mild intellectual disability   . Schizoaffective disorder [facility])     Past Surgical History:  Procedure Laterality Date  . NO PAST SURGERIES     Family History:  Family History  Problem Relation Age of Onset  . Schizophrenia Mother   . Hypertension Mother   . Anesthesia problems Neg Hx   . Malignant hyperthermia Neg Hx   . Pseudochol deficiency Neg Hx   . Hypotension Neg Hx   . Migraines Neg Hx    Family Psychiatric  History: See H&P.  Social  History:  Social History   Substance and Sexual Activity  Alcohol Use No   Comment: denied all drug/alcohol use     Social History   Substance and Sexual Activity  Drug Use Yes  . Types: Marijuana   Comment: denied all drug/alcohol use    Social History   Socioeconomic History  . Marital status: Single    Spouse name: Not on file  . Number of children: 0  . Years of education: 73  . Highest education level: Not on file  Occupational History  . Occupation: Unemployed  Social Needs  . Financial resource strain: Not on file  . Food insecurity:    Worry: Not on file    Inability: Not on file  . Transportation needs:    Medical: Not on file    Non-medical: Not on file  Tobacco Use  . Smoking status: Current Every Day Smoker    Packs/day: 1.00    Years: 15.00    Pack years: 15.00    Types: Cigarettes    Last attempt to quit: 01/02/2017    Years since quitting: 1.2  . Smokeless tobacco: Never Used  . Tobacco comment: smoked since 42 yrs old  Substance and Sexual Activity  . Alcohol use: No    Comment: denied all drug/alcohol use  . Drug use: Yes    Types: Marijuana    Comment: denied all drug/alcohol use  . Sexual activity: Yes    Birth control/protection: None  Lifestyle  . Physical activity:    Days  per week: Not on file    Minutes per session: Not on file  . Stress: Not on file  Relationships  . Social connections:    Talks on phone: Not on file    Gets together: Not on file    Attends religious service: Not on file    Active member of club or organization: Not on file    Attends meetings of clubs or organizations: Not on file    Relationship status: Not on file  Other Topics Concern  . Not on file  Social History Narrative   Lives with fiance, Erlene Quan   Caffeine use: Soda: 1 soda daily   No coffee   No tea   Hospital Course: (Per Md's discharge SRA): Sarah [name] is a 41 y/o F with history of schizoaffective disorder depressive type who was  admitted voluntarily from [facility] with worsening depression, CAH to kill herself, and overdose of 15 tablets of OTC analgesics (mix of tylenol and advil). Pt was medically cleared and then transferred to [facility] for additional treatment and stabilization. She was restarted on home medications of abilify (oral), lamictal, and zoloft.Collateral from pt's ACT team confirmed that she was due for abilify maintena injection on 6/6, so that was also administered during her admission. Pt reported incremental improvement of her presenting symptoms.  Besides the use of Abilify 5 mg tablet for mood control, Abilify ER 400 mg injectable for mood control, [name] was also medicated & discharged on; Lamictal 25 mg for mood stabilization, Hydroxyzine 25 mg prn for anxiety, Sertraline 100 mg for depression & Trazodone 50 mg prn for insomnia. She was enrolled & participated in the group counseling sessions being offered & held on this unit. She learned coping skills that should help her cope better to maintain mood stability. She presented other significant pre-existing medical issues that required treatment & or monitoring. She was resumed/discharged on all her pertinent home medications for those health issues. She tolerated his treatment regimen without any adverse effects or reactions reported.  Today upon evaluation, pt shares, "I'm doing good." She reports that her mood has improved, stating, "Depression is a zero today." She denies any specific concerns. She is sleeping well. Her appetite is good. She denies other physical complaints. She denies SI/HI/AH/VH. She is tolerating her medications well, and she is in agreement to continue her current regimen without changes. She plans to follow up with her ACT team. Discussed with patient about importance of regularly checking in with her ACT team, as they had been out of contact for about 2 weeks, and pt verbalized good understanding. She was able to engage in safety planning  including plan to return to [facility] or contact emergency services if she feels unable to maintain her own safety or the safety of others. Pt had no further questions, comments, or concerns.   Physical Findings: AIMS: Facial and Oral Movements Muscles of Facial Expression: None, normal Lips and Perioral Area: None, normal Jaw: None, normal Tongue: None, normal,Extremity Movements Upper (arms, wrists, hands, fingers): None, normal Lower (legs, knees, ankles, toes): None, normal, Trunk Movements Neck, shoulders, hips: None, normal, Overall Severity Severity of abnormal movements (highest score from questions above): None, normal Incapacitation due to abnormal movements: None, normal Patient's awareness of abnormal movements (rate only patient's report): No Awareness, Dental Status Current problems with teeth and/or dentures?: No Does patient usually wear dentures?: No  CIWA:    COWS:     Musculoskeletal: Strength & Muscle Tone: within normal limits Gait &  Station: normal Patient leans: Right  Psychiatric Specialty Exam: Physical Exam  Constitutional: She appears well-developed.  HENT:  Head: Normocephalic.  Eyes: Pupils are equal, round, and reactive to light.  Cardiovascular:  Elevated pulse rate  Respiratory: Effort normal.  GI: Soft.  Genitourinary:  Genitourinary Comments: Deferred  Musculoskeletal: Normal range of motion.  Neurological: She is alert.  Skin: Skin is warm.    Review of Systems  Constitutional: Negative.   HENT: Negative.   Eyes: Negative.   Respiratory: Negative.   Cardiovascular: Negative.   Gastrointestinal: Negative.   Genitourinary: Negative.   Musculoskeletal: Negative.   Skin: Negative.   Neurological: Negative.   Endo/Heme/Allergies: Negative.   Psychiatric/Behavioral: Positive for depression (Stabilized with medication prior to discharge), hallucinations (Hx. Psychosis (Stabilized with medication prior to discharge)) and substance abuse (Hx.  Cannabis use disorder (stable)). Negative for memory loss and suicidal ideas. The patient has insomnia (Stabilized with medication prior to discharge ). The patient is not nervous/anxious.     Blood pressure (!) 137/97, pulse (!) 102, temperature 97.7 F (36.5 C), temperature source Oral, resp. rate 20, height 5' 6.5" (1.689 m), weight 102.1 kg (225 lb), last menstrual period 03/07/2018.Body mass index is 35.77 kg/m.  See Md's SRA.   Have you used any form of tobacco in the last 30 days? (Cigarettes, Smokeless Tobacco, Cigars, and/or Pipes): Yes  Has this patient used any form of tobacco in the last 30 days? (Cigarettes, Smokeless Tobacco, Cigars, and/or Pipes): No  Blood Alcohol level:  Lab Results  Component Value Date   ETH <10 03/21/2018   ETH <10 03/30/7627   Metabolic Disorder Labs:  Lab Results  Component Value Date   HGBA1C 5.3 03/23/2018   MPG 105.41 03/23/2018   MPG 99.67 07/31/2017   Lab Results  Component Value Date   PROLACTIN 17.5 07/31/2017   PROLACTIN 18.7 01/04/2017   Lab Results  Component Value Date   CHOL 222 (H) 03/23/2018   TRIG 83 03/23/2018   HDL 59 03/23/2018   CHOLHDL 3.8 03/23/2018   VLDL 17 03/23/2018   LDLCALC 146 (H) 03/23/2018   LDLCALC 89 01/04/2017   See Psychiatric Specialty Exam and Suicide Risk Assessment completed by Attending Physician prior to discharge.  Discharge destination:  Home  Is patient on multiple antipsychotic therapies at discharge:  No   Has Patient had three or more failed trials of antipsychotic monotherapy by history:  No  Recommended Plan for Multiple Antipsychotic Therapies: NA  Allergies as of 03/26/2018      Reactions   Penicillins Anaphylaxis   Has patient had a PCN reaction causing immediate rash, facial/tongue/throat swelling, SOB or lightheadedness with hypotension: throat and face swelling. YES Has patient had a PCN reaction causing severe rash involving mucus membranes or skin necrosis: Yes Has  patient had a PCN reaction that required hospitalization Yes Has patient had a PCN reaction occurring within the last 10 years: No If all of the above answers are "NO", then may proceed with Cephalosporin use.   Bee Venom Swelling   Coconut Oil Hives, Swelling   ALLERGIES TO COCONUT      Medication List    STOP taking these medications   nicotine polacrilex 2 MG gum Commonly known as:  NICORETTE   VITAMIN D PO     TAKE these medications     Indication  amLODipine 10 MG tablet Commonly known as:  NORVASC Take 1 tablet (10 mg total) by mouth daily. For high blood pressure  Indication:  High Blood Pressure Disorder   ARIPiprazole 5 MG tablet Commonly known as:  ABILIFY Take 1 tablet (5 mg total) by mouth daily. For mood control Start taking on:  03/27/2018 What changed:  You were already taking a medication with the same name, and this prescription was added. Make sure you understand how and when to take each.  Indication:  Mood control   ARIPiprazole ER 400 MG Srer injection Commonly known as:  ABILIFY MAINTENA Inject 2 mLs (400 mg total) into the muscle every 28 (twenty-eight) days. (Due on 07-150-19): For mood control Start taking on:  04/20/2018 What changed:    when to take this  additional instructions  Indication:  Mood control   hydrochlorothiazide 25 MG tablet Commonly known as:  HYDRODIURIL Take 1 tablet (25 mg total) by mouth daily. For high blood pressure  Indication:  High Blood Pressure Disorder   hydrOXYzine 25 MG tablet Commonly known as:  ATARAX/VISTARIL Take 1 tablet (25 mg total) by mouth every 6 (six) hours as needed for anxiety.  Indication:  Feeling Anxious   lamoTRIgine 25 MG tablet Commonly known as:  LAMICTAL Take 1 tablet (25 mg total) by mouth daily. For mood stabilization What changed:  additional instructions  Indication:  Mood stabilization   sertraline 100 MG tablet Commonly known as:  ZOLOFT Take 1 tablet (100 mg total) by mouth  daily. For depression What changed:  additional instructions  Indication:  Major Depressive Disorder   traZODone 50 MG tablet Commonly known as:  DESYREL Take 1 tablet (50 mg total) by mouth at bedtime as needed for sleep. What changed:    medication strength  how much to take  Indication:  Trouble Sleeping      Follow-up Information    [facility] Follow up.   Why:  Someone from the ACT team will pick you up at 11:30 the day of d/c. Contact information: [place] [address] [phone]          Follow-up recommendations:  Activity:  As tolerated Diet: As recommended by your primary care doctor. Keep all scheduled follow-up appointments as recommended.  Comments: Patient is instructed prior to discharge to: Take all medications as prescribed by his/her mental healthcare provider. Report any adverse effects and or reactions from the medicines to his/her outpatient provider promptly. Patient has been instructed & cautioned: To not engage in alcohol and or illegal drug use while on prescription medicines. In the event of worsening symptoms, patient is instructed to call the crisis hotline, 911 and or go to the nearest ED for appropriate evaluation and treatment of symptoms. To follow-up with his/her primary care provider for your other medical issues, concerns and or health care needs.   Signed: Lindell Spar, NP, PMHNP, FNP-BC. 03/26/2018, 10:14 AM   Patient seen, Suicide Assessment Completed.  Disposition Plan Reviewed

## 2018-03-26 NOTE — Progress Notes (Signed)
Patient ID: Sarah [name], female   DOB: 08-Jun-1976, 42 y.o.   MRN: [id number] Patient discharged to home/self care in the presence of her ACT team nurse.  Patient denies SI, HI and AVH upon discharge and stated "I am ready to go home."  Patient acknowledged understanding of discharge instructions and receipt of personal belongings.

## 2018-03-26 NOTE — Progress Notes (Signed)
Pt is up and in dayroom.  Pt attends group and participates.  Pt is med compliant and denies SI, HI and AVH at this time pt contracts for safety.  Pt denies pain or discomfort.  Pt attends group and participates. 15 min checks for patient safety.  Pt in bed sleeping.  Pt remains safe at this time.

## 2018-03-26 NOTE — BHH Suicide Risk Assessment (Signed)
[facility] Discharge Suicide Risk Assessment   Principal Problem: Schizoaffective disorder, depressive type [facility]) Discharge Diagnoses:  Patient Active Problem List   Diagnosis Date Noted  . Schizoaffective disorder ([place]) [F25.9] 07/29/2017  . MDD (major depressive disorder), recurrent episode, severe ([place]) [F33.2] 02/19/2017  . Schizoaffective disorder, bipolar type ([place]) [F25.0] 01/06/2017  . 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 IIH (idiopathic intracranial hypertension) [G93.2] 01/24/2016  . Cannabis use disorder, moderate, dependence ([place]) [F12.20] 08/18/2015  . Schizoaffective disorder, depressive type ([place]) [F25.1] 01/15/2012    Class: Acute    Total Time spent with patient: 30 minutes  Musculoskeletal: Strength & Muscle Tone: within normal limits Gait & Station: normal Patient leans: N/A  Psychiatric Specialty Exam: Review of Systems  Constitutional: Negative for chills and fever.  Respiratory: Negative for cough and shortness of breath.   Cardiovascular: Negative for chest pain.  Gastrointestinal: Negative for abdominal pain, heartburn, nausea and vomiting.  Psychiatric/Behavioral: Negative for depression, hallucinations and suicidal ideas. The patient is not nervous/anxious and does not have insomnia.     Blood pressure (!) 137/97, pulse (!) 102, temperature 97.7 F (36.5 C), temperature source Oral, resp. rate 20, height 5' 6.5" (1.689 m), weight 102.1 kg (225 lb), last menstrual period 03/07/2018.Body mass index is 35.77 kg/m.  General Appearance: Casual and Fairly Groomed  Engineer, water::  Good  Speech:  Clear and Coherent and Normal Rate  Volume:  Normal  Mood:  Euthymic  Affect:  Flat  Thought Process:  Coherent and Goal Directed  Orientation:  Full (Time, Place, and Person)  Thought Content:  Logical  Suicidal Thoughts:  No  Homicidal Thoughts:  No  Memory:  Immediate;   Fair Recent;   Fair Remote;   Fair  Judgement:  Fair  Insight:  Lacking   Psychomotor Activity:  Normal  Concentration:  Fair  Recall:  [employer] of Knowledge:Fair  Language: Fair  Akathisia:  No  Handed:    AIMS (if indicated):     Assets:  Communication Skills Resilience Social Support  Sleep:  Number of Hours: 6.75  Cognition: WNL  ADL's:  Intact   Mental Status Per Nursing Assessment::   On Admission:  NA  Demographic Factors:  Low socioeconomic status and Unemployed  Loss Factors: NA   Historical Factors: Impulsivity  Risk Reduction Factors:   Living with another person, especially a relative, Positive social support, Positive therapeutic relationship and Positive coping skills or problem solving skills  Continued Clinical Symptoms:  Bipolar Disorder:   Depressive phase Schizophrenia:   Paranoid or undifferentiated type  Cognitive Features That Contribute To Risk:  None    Suicide Risk:  Minimal: No identifiable suicidal ideation.  Patients presenting with no risk factors but with morbid ruminations; may be classified as minimal risk based on the severity of the depressive symptoms  Follow-up Information    [facility] Follow up.   Why:  Someone from the ACT team will pick you up at 11:30 the day of d/c. Contact information: [place] [address] [phone]         Subjective Data:  Sarah [name] is a 42 y/o F with history of schizoaffective disorder depressive type who was [facility] voluntarily from [facility] with worsening depression, CAH to kill herself, and overdose of 15 tablets of OTC analgesics (mix of tylenol and advil). Pt was medically cleared and then transferred to [facility] for additional treatment and stabilization. She was restarted on home medications of abilify (oral),  lamictal, and zoloft. Collateral from pt's ACT team confirmed that she was due for abilify maintena injection on 6/6, so that was also administered during her admission. Pt reported incremental improvement of her  presenting symptoms.  Today upon evaluation, pt shares, "I'm doing good." She reports that her mood has improved, stating, "Depression is a zero today." She denies any specific concerns. She is sleeping well. Her appetite is good. She denies other physical complaints. She denies SI/HI/AH/VH. She is tolerating her medications well, and she is in agreement to continue her current regimen without changes. She plans to follow up with her ACT team. Discussed with patient about importance of regularly checking in with her ACT team, as they had been out of contact for about 2 weeks, and pt verbalized good understanding. She was able to engage in safety planning including plan to return to [facility] or contact emergency services if she feels unable to maintain her own safety or the safety of others. Pt had no further questions, comments, or concerns.   Plan Of Care/Follow-up recommendations:   -Discharge to outpatient level of care  -Schizoaffective disorder, depressive type -Continue abilify 5mg  po qDay -ContinueAbilify Maintena 400mg  IM q28 days (administered on6/17/19) -Continue zoloft 100mg  po qday -Continue lamictal 25mg  po qDay  -Anxiety -Continue vistaril 25mg  po q6h prn anxiety  - Insomnia -Continue trazodone 50mg  po qhs prn insomnia  -HTN -Continue amlodipine 10mg  po qday -Continue HCTZ 25mg  po qDay  Activity:  as tolerated Diet:  normal Tests:  NA Other:  see above for [place], MD 03/26/2018, 9:24 AM

## 2018-03-26 NOTE — Progress Notes (Signed)
Recreation Therapy Notes  Date: 6.20.19 Time: 1000 Location: 500 Hall Dayroom  Group Topic: Stress Management  Goal Area(s) Addresses:  Patient will verbalize importance of using healthy stress management.  Patient will identify positive emotions associated with healthy stress management.   Intervention: Stress Management  Activity :  LRT introduced the stress management technique of meditation to patients.  LRT spoke with patient about how meditation in conjunction with deep breathing can help to relieve stress and bring on a sense of calm.  LRT then played a meditation that allowed patients to focus on positive thoughts and let their breathing guide them.  Education:  Stress Management, Discharge Planning.   Education Outcome: Acknowledges edcuation/In group clarification offered/Needs additional education  Clinical Observations/Feedback: Pt did not attend group.    Victorino Sparrow, LRT/CTRS         [name], Malachai Schalk A 03/26/2018 10:53 AM

## 2018-03-26 NOTE — Plan of Care (Signed)
Pt did not attend recreational therapy group sessions.   Marquita Lias, LRT/CTRS 

## 2018-03-26 NOTE — Progress Notes (Signed)
  [facility] Adult Case Management Discharge Plan :  Will you be returning to the same living situation after discharge:  Yes,  home At discharge, do you have transportation home?: Yes,  ACT team Do you have the ability to pay for your medications: Yes,  MCD  Release of information consent forms completed and in the chart;  Patient's signature needed at discharge.  Patient to Follow up at: Follow-up Information    [facility] Follow up.   Why:  Someone from the ACT team will pick you up at 11:30 the day of d/c. Contact information: [place] [address] [phone]           Next level of care provider has access to [place] and Suicide Prevention discussed: Yes,  yes  Have you used any form of tobacco in the last 30 days? (Cigarettes, Smokeless Tobacco, Cigars, and/or Pipes): Yes  Has patient been referred to the Quitline?: Patient refused referral  Patient has been referred for addiction treatment: [place], LCSW 03/26/2018, 1:26 PM

## 2018-03-26 NOTE — Progress Notes (Signed)
Recreation Therapy Notes  INPATIENT RECREATION TR PLAN  Patient Details Name: Sarah [name] MRN: [id number] DOB: April 06, 1976 Today's Date: 03/26/2018  Rec Therapy Plan Is patient appropriate for Therapeutic Recreation?: Yes Treatment times per week: about 3 days Estimated Length of Stay: 5-7 days TR Treatment/Interventions: Group participation (Comment)  Discharge Criteria Pt will be discharged from therapy if:: Discharged Treatment plan/goals/alternatives discussed and agreed upon by:: Patient/family  Discharge Summary Short term goals set: See patient care plan Short term goals met: Not met Reason goals not met: Pt did not attend recreational group sessions. Therapeutic equipment acquired: N/[name] Reason patient discharged from therapy: Discharge from hospital Pt/family agrees with progress & goals achieved: Yes Date patient discharged from therapy: 03/26/18   Victorino Sparrow, LRT/CTRS   [name], Sarah [name] 03/26/2018, 9:41 AM

## 2018-05-25 ENCOUNTER — Emergency Department (HOSPITAL_COMMUNITY)
Admission: EM | Admit: 2018-05-25 | Discharge: 2018-05-25 | Disposition: A | Discharge status: Home / Self Care | Payer: Medicaid Other | Attending: Emergency Medicine | Admitting: Emergency Medicine

## 2018-05-25 ENCOUNTER — Encounter (HOSPITAL_COMMUNITY): Payer: Self-pay | Admitting: Emergency Medicine

## 2018-05-25 DIAGNOSIS — F17210 Nicotine dependence, cigarettes, uncomplicated: Secondary | ICD-10-CM | POA: Diagnosis not present

## 2018-05-25 DIAGNOSIS — F322 Major depressive disorder, single episode, severe without psychotic features: Secondary | ICD-10-CM | POA: Diagnosis not present

## 2018-05-25 DIAGNOSIS — Z79899 Other long term (current) drug therapy: Secondary | ICD-10-CM | POA: Insufficient documentation

## 2018-05-25 DIAGNOSIS — F329 Major depressive disorder, single episode, unspecified: Secondary | ICD-10-CM | POA: Diagnosis present

## 2018-05-25 DIAGNOSIS — I10 Essential (primary) hypertension: Secondary | ICD-10-CM | POA: Insufficient documentation

## 2018-05-25 DIAGNOSIS — F251 Schizoaffective disorder, depressive type: Secondary | ICD-10-CM | POA: Diagnosis present

## 2018-05-25 LAB — RAPID URINE DRUG SCREEN, HOSP PERFORMED
Amphetamines: NOT DETECTED
Barbiturates: NOT DETECTED
Benzodiazepines: NOT DETECTED
Cocaine: NOT DETECTED
TETRAHYDROCANNABINOL: NOT DETECTED

## 2018-05-25 LAB — COMPREHENSIVE METABOLIC PANEL
ALK PHOS: 68 U/L (ref 38–126)
ALT: 25 U/L (ref 0–44)
AST: 25 U/L (ref 15–41)
Albumin: 3.6 g/dL (ref 3.5–5.0)
Anion gap: 8 (ref 5–15)
BUN: 8 mg/dL (ref 6–20)
CALCIUM: 8.8 mg/dL — AB (ref 8.9–10.3)
CO2: 25 mmol/L (ref 22–32)
CREATININE: 0.66 mg/dL (ref 0.44–1.00)
Chloride: 111 mmol/L (ref 98–111)
Glucose, Bld: 86 mg/dL (ref 70–99)
Potassium: 3.4 mmol/L — ABNORMAL LOW (ref 3.5–5.1)
Sodium: 144 mmol/L (ref 135–145)
Total Bilirubin: 0.7 mg/dL (ref 0.3–1.2)
Total Protein: 7.5 g/dL (ref 6.5–8.1)

## 2018-05-25 LAB — I-STAT BETA HCG BLOOD, ED (MC, WL, AP ONLY)

## 2018-05-25 LAB — ETHANOL

## 2018-05-25 LAB — CBC
HCT: 30.2 % — ABNORMAL LOW (ref 36.0–46.0)
Hemoglobin: 9.6 g/dL — ABNORMAL LOW (ref 12.0–15.0)
MCH: 25.8 pg — AB (ref 26.0–34.0)
MCHC: 31.8 g/dL (ref 30.0–36.0)
MCV: 81.2 fL (ref 78.0–100.0)
PLATELETS: 503 10*3/uL — AB (ref 150–400)
RBC: 3.72 MIL/uL — ABNORMAL LOW (ref 3.87–5.11)
RDW: 15.9 % — ABNORMAL HIGH (ref 11.5–15.5)
WBC: 6.6 10*3/uL (ref 4.0–10.5)

## 2018-05-25 LAB — ACETAMINOPHEN LEVEL: Acetaminophen (Tylenol), Serum: <10 ug/mL — ABNORMAL LOW (ref 10–30)

## 2018-05-25 LAB — SALICYLATE LEVEL

## 2018-05-25 MED ORDER — QUETIAPINE FUMARATE 50 MG PO TABS: 150.0000 mg | ORAL_TABLET | Freq: Every day | ORAL | Status: DC

## 2018-05-25 MED ORDER — SERTRALINE HCL 50 MG PO TABS
100.0000 mg | ORAL_TABLET | Freq: Every day | ORAL | Status: DC
  Administered 2018-05-25: 100 mg via ORAL
  Filled 2018-05-25: qty 2

## 2018-05-25 MED ORDER — TRAZODONE HCL 50 MG PO TABS: 50.0000 mg | ORAL_TABLET | Freq: Every evening | ORAL | Status: DC | PRN

## 2018-05-25 MED ORDER — AMLODIPINE BESYLATE 5 MG PO TABS
10.0000 mg | ORAL_TABLET | Freq: Every day | ORAL | Status: DC
  Administered 2018-05-25: 10 mg via ORAL
  Filled 2018-05-25: qty 2

## 2018-05-25 MED ORDER — LAMOTRIGINE 25 MG PO TABS
25.0000 mg | ORAL_TABLET | Freq: Every day | ORAL | Status: DC
  Administered 2018-05-25: 25 mg via ORAL
  Filled 2018-05-25: qty 1

## 2018-05-25 MED ORDER — HYDROCHLOROTHIAZIDE 25 MG PO TABS
25.0000 mg | ORAL_TABLET | Freq: Every day | ORAL | Status: DC
  Administered 2018-05-25: 25 mg via ORAL
  Filled 2018-05-25: qty 1

## 2018-05-25 MED ORDER — ARIPIPRAZOLE 5 MG PO TABS
5.0000 mg | ORAL_TABLET | Freq: Every day | ORAL | Status: DC
  Administered 2018-05-25: 5 mg via ORAL
  Filled 2018-05-25: qty 1

## 2018-05-25 MED ORDER — HYDROXYZINE HCL 25 MG PO TABS: 25.0000 mg | ORAL_TABLET | Freq: Four times a day (QID) | ORAL | Status: DC | PRN

## 2018-05-25 NOTE — BH Assessment (Addendum)
[facility] Assessment Progress Note  Per Buford Dresser, DO, this pt does not require psychiatric hospitalization at this time.  Pt is to be discharged from [facility] with recommendation to continue treatment with the [facility] Team.  This has been included in pt's discharge instructions.  Pt's nurse, Geni Bers, has been notified.  Jalene Mullet, MA Triage Specialist [phone]   Addendum:  Per pt's nurse, Geni Bers, pt needs arrangements to return home.  At 11:40 this Probation officer called the [facility] ACT Team and spoke to [place] to make arrangements.  She agrees to speak to her team lead and to call me back.  Return call is pending as of this writing.  Jalene Mullet, [place] Coordinator [phone]

## 2018-05-25 NOTE — ED Notes (Signed)
Pt changed into scrubs and wanded by security  

## 2018-05-25 NOTE — BH Assessment (Signed)
[facility] Assessment Progress Note Case was staffed with Marion Downer, Mariea Clonts DO who also evaluated patient and recommended patient be discharged later this date and follow up with her ACT team. [facility] will be contacted to provide transportation on discharge.

## 2018-05-25 NOTE — BHH Suicide Risk Assessment (Signed)
Suicide Risk Assessment  Discharge Assessment   [facility] Discharge Suicide Risk Assessment   Principal Problem: Schizoaffective disorder, depressive type [facility]) Discharge Diagnoses:  Patient Active Problem List   Diagnosis Date Noted  . Schizoaffective disorder, depressive type ([place]) [F25.1] 01/15/2012    Priority: High    Class: Acute  . Schizoaffective disorder ([place]) [F25.9] 07/29/2017  . MDD (major depressive disorder), recurrent episode, severe ([place]) [F33.2] 02/19/2017  . Schizoaffective disorder, bipolar type ([place]) [F25.0] 01/06/2017  . 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 IIH (idiopathic intracranial hypertension) [G93.2] 01/24/2016  . Cannabis use disorder, moderate, dependence ([place]) [F12.20] 08/18/2015    Total Time spent with patient: 45 minutes  Musculoskeletal: Strength & Muscle Tone: within normal limits Gait & Station: normal Patient leans: N/A  Psychiatric Specialty Exam:   Blood pressure (!) 142/96, pulse (!) 59, temperature 98.5 F (36.9 C), temperature source Oral, resp. rate 18, last menstrual period 04/22/2018, SpO2 100 %.There is no height or weight on file to calculate BMI.  General Appearance: Casual  Eye Contact::  Good  Speech:  Normal Rate409  Volume:  Normal  Mood:  Depressed, mild  Affect:  Blunt  Thought Process:  Coherent and Descriptions of Associations: Intact  Orientation:  Full (Time, Place, and Person)  Thought Content:  WDL and Logical  Suicidal Thoughts:  No  Homicidal Thoughts:  No  Memory:  Immediate;   Good Recent;   Good Remote;   Good  Judgement:  Fair  Insight:  Fair  Psychomotor Activity:  Normal  Concentration:  Good  Recall:  Good  Fund of Knowledge:Fair  Language: Good  Akathisia:  No  Handed:  Right  AIMS (if indicated):     Assets:  Leisure Time Physical Health Resilience Social Support  Sleep:     Cognition: WNL  ADL's:  Intact   Mental Status Per Nursing Assessment::   On Admission:   42 yo female  who presented to the ED with suicidal ideations that started yesterday.  She came to the ED this morning after not being able to reach her [facility] team, [facility].  She said she heard voices to tell her to take an overdose earlier but no voices on assessment.  No suicidal/homicidal ideations, hallucinations, or substance abuse.  STable to discharge to her ACT team.  Caveat, discharged from [facility] in June.  Demographic Factors:  Living alone  Loss Factors: NA  Historical Factors: NA  Risk Reduction Factors:   Sense of responsibility to family, Positive social support and Positive therapeutic relationship  Continued Clinical Symptoms:  Depression, mild  Cognitive Features That Contribute To Risk:  None    Suicide Risk:  Minimal: No identifiable suicidal ideation.  Patients presenting with no risk factors but with morbid ruminations; may be classified as minimal risk based on the severity of the depressive symptoms    Plan Of Care/Follow-up recommendations:  Activity:  as tolerated Diet:  heart healthy diet  Medford Staheli, NP 05/25/2018, 10:57 AM

## 2018-05-25 NOTE — Progress Notes (Signed)
[facility] team will pick her up at 1 pm today.  Waylan Boga, PMHNP

## 2018-05-25 NOTE — Discharge Instructions (Signed)
For your behavioral health needs, you are advised to continue treatment with the [facility] Team: ° °     [facility] °     [address] °     , [place] [address] °     [phone] °

## 2018-05-25 NOTE — BH Assessment (Addendum)
Assessment Note  Sarah [name] is an 42 y.o. female that presents this date after voicing thoughts of self harm. Patient denies any S/I at the time of assessment although per notes patient voiced S/I on admission. Patient stated she "was just mad" at the time of admission and has recently been having "family issues" reporting ongoing conflict with partner and presented this date after a verbal argument. Patient reports 3 prior attempts at self harm and per notes was last seen by this provider on 02/11/18 presenting with similar symptoms at that time. Per 02/11/18 note: "Patient presented to the ED after "waking up with suicidal ideations." Patient did not meet inpatient criteria at that time and was stable for discharge. Patient is receiving services from [facility] and was advised to follow up with that provider for continued services on that date". Patient denies any H/I or AVH this date and is oriented x 4. Patient speaks in a low soft voice and makes fair eye contact. Patient states she "needs to quit getting mad and wanting to hurt herself" voicing some insight into her present situation. Patient contracts for safety as this Probation officer assists patient in brainstorming ideas to better deal with her anger. Patient does report that she feels "her medications may be wrong" although reports current compliance. Patient states that after every altercation with partner that her depression worseness with symptoms to include: feeling hopeless and guilt. Patient is requesting to be discharged and can currently contract for safety. Case was staffed with Marion Downer, Mariea Clonts DO who also evaluated patient and recommended patient be discharged later this date and follow up with her ACT team. [facility] will be contacted to provide transportation on discharge.         Diagnosis: F32.2 MDD recurrent without psychotic symptoms, severe    Past Medical History:  Past Medical History:  Diagnosis Date  . Anemia   . Anxiety    . Depression   . Elevated bilirubin    slight elevation at 1.3  . Hypertension   . Hypokalemia   . Intracranial hypertension   . Migraines   . Mild intellectual disability   . Schizoaffective disorder [facility])     Past Surgical History:  Procedure Laterality Date  . NO PAST SURGERIES      Family History:  Family History  Problem Relation Age of Onset  . Schizophrenia Mother   . Hypertension Mother   . Anesthesia problems Neg Hx   . Malignant hyperthermia Neg Hx   . Pseudochol deficiency Neg Hx   . Hypotension Neg Hx   . Migraines Neg Hx     Social History:  reports that she has been smoking cigarettes. She has a 15.00 pack-year smoking history. She has never used smokeless tobacco. She reports that she has current or past drug history. Drug: Marijuana. She reports that she does not drink alcohol.  Additional Social History:  Alcohol / Drug Use Pain Medications: none Prescriptions: none History of alcohol / drug use?: No history of alcohol / drug abuse Longest period of sobriety (when/how long): Unknown Negative Consequences of Use: (Denies) Withdrawal Symptoms: (Denies)  CIWA: CIWA-Ar BP: (!) 142/96 Pulse Rate: (!) 59 COWS:    Allergies:  Allergies  Allergen Reactions  . Penicillins Anaphylaxis    Has patient had a PCN reaction causing immediate rash, facial/tongue/throat swelling, SOB or lightheadedness with hypotension: throat and face swelling. YES Has patient had a PCN reaction causing severe rash involving mucus membranes or skin necrosis: Yes  Has patient had a PCN reaction that required hospitalization Yes Has patient had a PCN reaction occurring within the last 10 years: No If all of the above answers are "NO", then may proceed with Cephalosporin use.   . Bee Venom Swelling  . Coconut Oil Hives and Swelling    ALLERGIES TO COCONUT    Home Medications:  (Not in a hospital admission)  OB/GYN Status:  Patient's last menstrual period was  04/22/2018.  General Assessment Data [facility] of Assessment: [facility] TTS Assessment: In system Is this a Tele or Face-to-Face Assessment?: Face-to-Face Is this an Initial Assessment or a Re-assessment for this encounter?: Initial Assessment Marital status: Married [place] name: Yorio Is patient pregnant?: No Pregnancy Status: No Living Arrangements: Spouse/significant other Can pt return to current living arrangement?: Yes Admission Status: Voluntary Is patient capable of signing voluntary admission?: Yes Referral Source: Self/Family/Friend Insurance type: Medicaid  Medical Screening Exam ([place]) Medical Exam completed: Yes  Crisis Care Plan Living Arrangements: Spouse/significant other Legal Guardian: (NA) Name of Psychiatrist: [facility] Name of Therapist: [facility]  Education Status Is patient currently in school?: No Is the patient employed, unemployed or receiving disability?: Unemployed  Risk to self with the past 6 months Suicidal Ideation: No Has patient been a risk to self within the past 6 months prior to admission? : Yes Suicidal Intent: No Has patient had any suicidal intent within the past 6 months prior to admission? : Yes Is patient at risk for suicide?: Yes Suicidal Plan?: No Has patient had any suicidal plan within the past 6 months prior to admission? : Yes Access to Means: No What has been your use of drugs/alcohol within the last 12 months?: Denies Previous Attempts/Gestures: Yes How many times?: 3(Per notes) Other Self Harm Risks: NA Triggers for Past Attempts: Unknown Intentional Self Injurious Behavior: None Family Suicide History: No Recent stressful life event(s): Conflict (Comment)(Family issues) Persecutory voices/beliefs?: No Depression: Yes Depression Symptoms: Feeling worthless/self pity Substance abuse history and/or treatment for substance abuse?: No Suicide prevention information given to non-admitted patients: Not  applicable  Risk to Others within the past 6 months Homicidal Ideation: No Does patient have any lifetime risk of violence toward others beyond the six months prior to admission? : No Thoughts of Harm to Others: No Current Homicidal Intent: No Current Homicidal Plan: No Access to Homicidal Means: No Identified Victim: NA History of harm to others?: No Assessment of Violence: None Noted Violent Behavior Description: NA Does patient have access to weapons?: No Criminal Charges Pending?: No Does patient have a court date: No Is patient on probation?: No  Psychosis Hallucinations: None noted Delusions: None noted  Mental Status Report Appearance/Hygiene: In scrubs Eye Contact: Fair Motor Activity: Freedom of movement Speech: Soft, Slow Level of Consciousness: Quiet/awake Mood: Depressed Affect: Appropriate to circumstance Anxiety Level: Minimal Thought Processes: Coherent, Relevant Judgement: Partial Orientation: Person, Place, Time Obsessive Compulsive Thoughts/Behaviors: None  Cognitive Functioning Concentration: Fair Memory: Recent Intact, Remote Intact Is patient IDD: No Is patient DD?: No Insight: Fair Impulse Control: Poor Appetite: Good Have you had any weight changes? : No Change Sleep: No Change Total Hours of Sleep: 7 Vegetative Symptoms: None  ADLScreening [facility] Assessment Services) Patient's cognitive ability adequate to safely complete daily activities?: Yes Patient able to express need for assistance with ADLs?: Yes Independently performs ADLs?: Yes (appropriate for developmental age)  Prior Inpatient Therapy Prior Inpatient Therapy: Yes Prior Therapy Dates: 2019, 2018 Prior Therapy Facilty/Provider(s): [facility], [facility] Reason for  Treatment: MH issues  Prior Outpatient Therapy Prior Outpatient Therapy: Yes Prior Therapy Dates: Ongoing Prior Therapy Facilty/Provider(s): [facility] Reason for Treatment: Med mang Does patient have an ACCT team?:  Yes Does patient have Intensive In-House Services?  : No Does patient have [facility] services? : No Does patient have P4CC services?: No  ADL Screening (condition at time of admission) Patient's cognitive ability adequate to safely complete daily activities?: Yes Is the patient deaf or have difficulty hearing?: No Does the patient have difficulty seeing, even when wearing glasses/contacts?: No Does the patient have difficulty concentrating, remembering, or making decisions?: No Patient able to express need for assistance with ADLs?: Yes Does the patient have difficulty dressing or bathing?: No Independently performs ADLs?: Yes (appropriate for developmental age) Does the patient have difficulty walking or climbing stairs?: No Weakness of Legs: None Weakness of Arms/Hands: None  Home Assistive Devices/Equipment Home Assistive Devices/Equipment: None  Therapy Consults (therapy consults require a physician order) PT Evaluation Needed: No OT Evalulation Needed: No SLP Evaluation Needed: No Abuse/Neglect Assessment (Assessment to be complete while patient is alone) Physical Abuse: Denies Verbal Abuse: Denies Sexual Abuse: Denies Exploitation of patient/patient's resources: Denies Self-Neglect: Denies Values / Beliefs Cultural Requests During Hospitalization: None Spiritual Requests During Hospitalization: None Consults Spiritual Care Consult Needed: No Social Work Consult Needed: No Regulatory affairs officer (For Healthcare) Does Patient Have a Medical Advance Directive?: No Would patient like information on creating a medical advance directive?: No - Patient declined    Additional Information 1:1 In Past 12 Months?: No CIRT Risk: No Elopement Risk: No Does patient have medical clearance?: Yes     Disposition: Case was staffed with Della Goo DO who also evaluated patient and recommended patient be discharged later this date and follow up with her ACT team. [facility] will  be contacted to provide transportation on discharge.  Disposition Initial Assessment Completed for this Encounter: Yes Disposition of Patient: (Observe and monitor) Patient refused recommended treatment: No Mode of transportation if patient is discharged?: (Unk)  On Site Evaluation by:   Reviewed with Physician:    Mamie Nick 05/25/2018 10:27 AM

## 2018-05-25 NOTE — ED Triage Notes (Signed)
Pt reports having thoughts of SI since yesterday with plan of taking pills, hanging herself or going out in traffic.

## 2018-05-25 NOTE — ED Provider Notes (Signed)
[facility] DEPT Provider Note   CSN: [id number] Arrival date & time: 05/25/18  0841     History   Chief Complaint Chief Complaint  Patient presents with  . Suicidal    HPI Sarah [name] is a 42 y.o. female.  42 year old female presents with increased depression with suicidal thoughts consisting of either taking pills or hanging herself or walking out into traffic.  Patient has a history of schizoaffective disorder as well as major depression.  History of suicide attempt via ingestion in the past.  States that she has been hearing voices telling her to harm her self.  Takes Abilify and states compliance.  No recent alcohol or drug use.  No homicidal ideations.  Her mental health care provider is [facility].     Past Medical History:  Diagnosis Date  . Anemia   . Anxiety   . Depression   . Elevated bilirubin    slight elevation at 1.3  . Hypertension   . Hypokalemia   . Intracranial hypertension   . Migraines   . Mild intellectual disability   . Schizoaffective disorder [facility])     Patient Active Problem List   Diagnosis Date Noted  . Schizoaffective disorder ([place]) 07/29/2017  . MDD (major depressive disorder), recurrent episode, severe ([place]) 02/19/2017  . Schizoaffective disorder, bipolar type ([place]) 01/06/2017  . Mild intellectual disability 01/06/2017  . IIH (idiopathic intracranial hypertension) 01/24/2016  . Cannabis use disorder, moderate, dependence ([place]) 08/18/2015  . Schizoaffective disorder, depressive type ([place]) 01/15/2012    Class: Acute    Past Surgical History:  Procedure Laterality Date  . NO PAST SURGERIES       OB History   None      Home Medications    Prior to Admission medications   Medication Sig Start Date End Date Taking? Authorizing Provider  amLODipine (NORVASC) 10 MG tablet Take 1 tablet (10 mg total) by mouth daily. For high blood pressure 03/26/18  Yes Nwoko, Agnes I, NP  ARIPiprazole (ABILIFY) 5  MG tablet Take 1 tablet (5 mg total) by mouth daily. For mood control 03/27/18  Yes Nwoko, Herbert Pun I, NP  ARIPiprazole ER (ABILIFY MAINTENA) 400 MG SRER injection Inject 2 mLs (400 mg total) into the muscle every 28 (twenty-eight) days. (Due on 07-150-19): For mood control Patient taking differently: Inject 400 mg into the muscle every 28 (twenty-eight) days. (Due on 05-21-18): For mood control 04/20/18  Yes Nwoko, Herbert Pun I, NP  hydrochlorothiazide (HYDRODIURIL) 25 MG tablet Take 1 tablet (25 mg total) by mouth daily. For high blood pressure 03/26/18  Yes Nwoko, Herbert Pun I, NP  hydrOXYzine (ATARAX/VISTARIL) 25 MG tablet Take 1 tablet (25 mg total) by mouth every 6 (six) hours as needed for anxiety. 03/26/18  Yes Lindell Spar I, NP  lamoTRIgine (LAMICTAL) 25 MG tablet Take 1 tablet (25 mg total) by mouth daily. For mood stabilization 03/26/18  Yes Nwoko, Herbert Pun I, NP  QUEtiapine (SEROQUEL) 100 MG tablet Take 150 mg by mouth at bedtime.   Yes [provider]  sertraline (ZOLOFT) 100 MG tablet Take 1 tablet (100 mg total) by mouth daily. For depression 03/26/18  Yes Lindell Spar I, NP  traZODone (DESYREL) 50 MG tablet Take 1 tablet (50 mg total) by mouth at bedtime as needed for sleep. 03/26/18  Yes Encarnacion Slates, NP    Family History Family History  Problem Relation Age of Onset  . Schizophrenia Mother   . Hypertension Mother   . Anesthesia  problems Neg Hx   . Malignant hyperthermia Neg Hx   . Pseudochol deficiency Neg Hx   . Hypotension Neg Hx   . Migraines Neg Hx     Social History Social History   Tobacco Use  . Smoking status: Current Every Day Smoker    Packs/day: 1.00    Years: 15.00    Pack years: 15.00    Types: Cigarettes    Last attempt to quit: 01/02/2017    Years since quitting: 1.3  . Smokeless tobacco: Never Used  . Tobacco comment: smoked since 42 yrs old  Substance Use Topics  . Alcohol use: No    Comment: denied all drug/alcohol use  . Drug use: Yes    Types:  Marijuana    Comment: denied all drug/alcohol use     Allergies   Penicillins; Bee venom; and Coconut oil   Review of Systems Review of Systems  All other systems reviewed and are negative.    Physical Exam Updated Vital Signs BP (!) 149/103 (BP Location: Right Arm)   Pulse 69   Temp 98.3 F (36.8 C) (Oral)   Resp 18   LMP 04/22/2018   SpO2 100%   Physical Exam  Constitutional: She is oriented to person, place, and time. She appears well-developed and well-nourished.  Non-toxic appearance. No distress.  HENT:  Head: Normocephalic and atraumatic.  Eyes: Pupils are equal, round, and reactive to light. Conjunctivae, EOM and lids are normal.  Neck: Normal range of motion. Neck supple. No tracheal deviation present. No thyroid mass present.  Cardiovascular: Normal rate, regular rhythm and normal heart sounds. Exam reveals no gallop.  No murmur heard. Pulmonary/Chest: Effort normal and breath sounds normal. No stridor. No respiratory distress. She has no decreased breath sounds. She has no wheezes. She has no rhonchi. She has no rales.  Abdominal: Soft. Normal appearance and bowel sounds are normal. She exhibits no distension. There is no tenderness. There is no rebound and no CVA tenderness.  Musculoskeletal: Normal range of motion. She exhibits no edema or tenderness.  Neurological: She is alert and oriented to person, place, and time. She has normal strength. No cranial nerve deficit or sensory deficit. GCS eye subscore is 4. GCS verbal subscore is 5. GCS motor subscore is 6.  Skin: Skin is warm and dry. No abrasion and no rash noted.  Psychiatric: Her affect is blunt. Her speech is delayed. She is withdrawn. She expresses suicidal ideation. She expresses no homicidal ideation. She expresses suicidal plans. She expresses no homicidal plans. She is attentive.  Nursing note and vitals reviewed.    ED Treatments / Results  Labs (all labs ordered are listed, but only abnormal  results are displayed) Labs Reviewed  COMPREHENSIVE METABOLIC PANEL  ETHANOL  SALICYLATE LEVEL  ACETAMINOPHEN LEVEL  CBC  RAPID URINE DRUG SCREEN, HOSP PERFORMED  I-STAT BETA HCG BLOOD, ED ([facility], [facility], [facility] ONLY)    EKG None  Radiology No results found.  Procedures Procedures (including critical care time)  Medications Ordered in ED Medications - No data to display   Initial Impression / Assessment and Plan / ED Course  I have reviewed the triage vital signs and the nursing notes.  Pertinent labs & imaging results that were available during my care of the patient were reviewed by me and considered in my medical decision making (see chart for details).    Patient is presumptively medically cleared.  Labs are pending.  Will consult TTS  Final Clinical Impressions(s) /  ED Diagnoses   Final diagnoses:  None    ED Discharge Orders    None       Lacretia Leigh, MD 05/25/18 [phone]

## 2018-06-22 ENCOUNTER — Encounter (HOSPITAL_COMMUNITY): Payer: Self-pay | Admitting: Emergency Medicine

## 2018-06-22 ENCOUNTER — Emergency Department (HOSPITAL_COMMUNITY)
Admission: EM | Admit: 2018-06-22 | Discharge: 2018-06-24 | Disposition: A | Discharge status: Home / Self Care | Payer: Medicaid Other | Attending: Emergency Medicine | Admitting: Emergency Medicine

## 2018-06-22 ENCOUNTER — Other Ambulatory Visit: Payer: Self-pay

## 2018-06-22 DIAGNOSIS — F332 Major depressive disorder, recurrent severe without psychotic features: Secondary | ICD-10-CM | POA: Insufficient documentation

## 2018-06-22 DIAGNOSIS — Z79899 Other long term (current) drug therapy: Secondary | ICD-10-CM | POA: Insufficient documentation

## 2018-06-22 DIAGNOSIS — R45851 Suicidal ideations: Secondary | ICD-10-CM | POA: Diagnosis not present

## 2018-06-22 DIAGNOSIS — F17210 Nicotine dependence, cigarettes, uncomplicated: Secondary | ICD-10-CM | POA: Insufficient documentation

## 2018-06-22 DIAGNOSIS — F251 Schizoaffective disorder, depressive type: Secondary | ICD-10-CM | POA: Diagnosis not present

## 2018-06-22 DIAGNOSIS — F259 Schizoaffective disorder, unspecified: Secondary | ICD-10-CM | POA: Diagnosis not present

## 2018-06-22 DIAGNOSIS — I10 Essential (primary) hypertension: Secondary | ICD-10-CM | POA: Diagnosis not present

## 2018-06-22 DIAGNOSIS — Z560 Unemployment, unspecified: Secondary | ICD-10-CM | POA: Diagnosis not present

## 2018-06-22 LAB — RAPID URINE DRUG SCREEN, HOSP PERFORMED
AMPHETAMINES: NOT DETECTED
BENZODIAZEPINES: NOT DETECTED
Barbiturates: NOT DETECTED
Cocaine: NOT DETECTED
Opiates: NOT DETECTED
TETRAHYDROCANNABINOL: NOT DETECTED

## 2018-06-22 LAB — COMPREHENSIVE METABOLIC PANEL
ALT: 13 U/L (ref 0–44)
AST: 20 U/L (ref 15–41)
Albumin: 3.4 g/dL — ABNORMAL LOW (ref 3.5–5.0)
Alkaline Phosphatase: 74 U/L (ref 38–126)
Anion gap: 6 (ref 5–15)
BILIRUBIN TOTAL: 0.7 mg/dL (ref 0.3–1.2)
BUN: 14 mg/dL (ref 6–20)
CHLORIDE: 112 mmol/L — AB (ref 98–111)
CO2: 26 mmol/L (ref 22–32)
CREATININE: 0.91 mg/dL (ref 0.44–1.00)
Calcium: 8.5 mg/dL — ABNORMAL LOW (ref 8.9–10.3)
GFR calc Af Amer: >60 mL/min (ref >60)
GLUCOSE: 88 mg/dL (ref 70–99)
Potassium: 3.7 mmol/L (ref 3.5–5.1)
Sodium: 144 mmol/L (ref 135–145)
TOTAL PROTEIN: 7.1 g/dL (ref 6.5–8.1)

## 2018-06-22 LAB — I-STAT BETA HCG BLOOD, ED (MC, WL, AP ONLY): I-stat hCG, quantitative: <5 m[IU]/mL (ref <5)

## 2018-06-22 LAB — CBC
HEMATOCRIT: 28.7 % — AB (ref 36.0–46.0)
Hemoglobin: 8.9 g/dL — ABNORMAL LOW (ref 12.0–15.0)
MCH: 25.1 pg — AB (ref 26.0–34.0)
MCHC: 31 g/dL (ref 30.0–36.0)
MCV: 81.1 fL (ref 78.0–100.0)
PLATELETS: 649 10*3/uL — AB (ref 150–400)
RBC: 3.54 MIL/uL — AB (ref 3.87–5.11)
RDW: 15.8 % — ABNORMAL HIGH (ref 11.5–15.5)
WBC: 6.1 10*3/uL (ref 4.0–10.5)

## 2018-06-22 LAB — ACETAMINOPHEN LEVEL: Acetaminophen (Tylenol), Serum: <10 ug/mL — ABNORMAL LOW (ref 10–30)

## 2018-06-22 LAB — ETHANOL

## 2018-06-22 LAB — SALICYLATE LEVEL: Salicylate Lvl: <7 mg/dL (ref 2.8–30.0)

## 2018-06-22 MED ORDER — TRAZODONE HCL 50 MG PO TABS: 50.0000 mg | ORAL_TABLET | Freq: Every evening | ORAL | Status: DC | PRN

## 2018-06-22 MED ORDER — HYDROXYZINE HCL 25 MG PO TABS: 25.0000 mg | ORAL_TABLET | Freq: Four times a day (QID) | ORAL | Status: DC | PRN

## 2018-06-22 MED ORDER — ARIPIPRAZOLE ER 400 MG IM SRER: 400.0000 mg | INTRAMUSCULAR | Status: DC

## 2018-06-22 MED ORDER — AMLODIPINE BESYLATE 5 MG PO TABS
10.0000 mg | ORAL_TABLET | Freq: Every day | ORAL | Status: DC
  Administered 2018-06-23 – 2018-06-24 (×2): 10 mg via ORAL
  Filled 2018-06-22 (×2): qty 2

## 2018-06-22 MED ORDER — ARIPIPRAZOLE 5 MG PO TABS: 5.0000 mg | ORAL_TABLET | Freq: Every day | ORAL | Status: DC

## 2018-06-22 MED ORDER — SERTRALINE HCL 50 MG PO TABS
100.0000 mg | ORAL_TABLET | Freq: Every day | ORAL | Status: DC
  Administered 2018-06-23 – 2018-06-24 (×2): 100 mg via ORAL
  Filled 2018-06-22 (×3): qty 2

## 2018-06-22 MED ORDER — HYDROCHLOROTHIAZIDE 25 MG PO TABS
25.0000 mg | ORAL_TABLET | Freq: Every day | ORAL | Status: DC
  Administered 2018-06-23 – 2018-06-24 (×2): 25 mg via ORAL
  Filled 2018-06-22 (×2): qty 1

## 2018-06-22 MED ORDER — QUETIAPINE FUMARATE 50 MG PO TABS
150.0000 mg | ORAL_TABLET | Freq: Every day | ORAL | Status: DC
  Administered 2018-06-22: 150 mg via ORAL
  Filled 2018-06-22: qty 1

## 2018-06-22 MED ORDER — LAMOTRIGINE 25 MG PO TABS
25.0000 mg | ORAL_TABLET | Freq: Every day | ORAL | Status: DC
  Administered 2018-06-23 – 2018-06-24 (×2): 25 mg via ORAL
  Filled 2018-06-22 (×2): qty 1

## 2018-06-22 NOTE — ED Notes (Signed)
Sitter at bedside.

## 2018-06-22 NOTE — ED Triage Notes (Signed)
Pt presents with [employer] for suicidal ideation with plan of overdose along with hallucinations of voices telling her to kill herself. Pt voluntary at this time.

## 2018-06-22 NOTE — ED Provider Notes (Signed)
[facility] DEPT Provider Note   CSN: [id number] Arrival date & time: 06/22/18  1915     History   Chief Complaint Chief Complaint  Patient presents with  . Suicidal    HPI Sarah [name] is a 42 y.o. female.  The history is provided by the patient.  Mental Health Problem  Presenting symptoms: suicidal thoughts and suicidal threats   Degree of incapacity (severity):  Moderate Onset quality:  Gradual Timing:  Constant Progression:  Worsening Context: stressful life event (Aunt died last week)   Context: not noncompliant   Treatment compliance:  All of the time Relieved by:  Antipsychotics Worsened by:  Family interactions Associated symptoms: poor judgment   Associated symptoms: no abdominal pain, no anhedonia, no chest pain, not distractible, no euphoric mood, no feelings of worthlessness, no hypersomnia, no hyperventilation, no insomnia, no irritability, no school problems and no weight change   Risk factors: hx of mental illness     Past Medical History:  Diagnosis Date  . Anemia   . Anxiety   . Depression   . Elevated bilirubin    slight elevation at 1.3  . Hypertension   . Hypokalemia   . Intracranial hypertension   . Migraines   . Mild intellectual disability   . Schizoaffective disorder [facility])     Patient Active Problem List   Diagnosis Date Noted  . Schizoaffective disorder ([place]) 07/29/2017  . MDD (major depressive disorder), recurrent episode, severe ([place]) 02/19/2017  . Schizoaffective disorder, bipolar type ([place]) 01/06/2017  . Mild intellectual disability 01/06/2017  . IIH (idiopathic intracranial hypertension) 01/24/2016  . Cannabis use disorder, moderate, dependence ([place]) 08/18/2015  . Schizoaffective disorder, depressive type ([place]) 01/15/2012    Class: Acute    Past Surgical History:  Procedure Laterality Date  . NO PAST SURGERIES       OB History   None      Home Medications    Prior to Admission  medications   Medication Sig Start Date End Date Taking? Authorizing Provider  amLODipine (NORVASC) 10 MG tablet Take 1 tablet (10 mg total) by mouth daily. For high blood pressure 03/26/18  Yes Nwoko, Agnes I, NP  ARIPiprazole (ABILIFY) 5 MG tablet Take 1 tablet (5 mg total) by mouth daily. For mood control 03/27/18  Yes Nwoko, Herbert Pun I, NP  ARIPiprazole ER (ABILIFY MAINTENA) 400 MG SRER injection Inject 2 mLs (400 mg total) into the muscle every 28 (twenty-eight) days. (Due on 07-150-19): For mood control Patient taking differently: Inject 400 mg into the muscle every 28 (twenty-eight) days. (Due on 05-21-18): For mood control 04/20/18  Yes Nwoko, Herbert Pun I, NP  hydrochlorothiazide (HYDRODIURIL) 25 MG tablet Take 1 tablet (25 mg total) by mouth daily. For high blood pressure 03/26/18  Yes Nwoko, Herbert Pun I, NP  hydrOXYzine (ATARAX/VISTARIL) 25 MG tablet Take 1 tablet (25 mg total) by mouth every 6 (six) hours as needed for anxiety. 03/26/18  Yes Lindell Spar I, NP  lamoTRIgine (LAMICTAL) 25 MG tablet Take 1 tablet (25 mg total) by mouth daily. For mood stabilization 03/26/18  Yes Nwoko, Herbert Pun I, NP  QUEtiapine (SEROQUEL) 100 MG tablet Take 150 mg by mouth at bedtime.   Yes [provider]  sertraline (ZOLOFT) 100 MG tablet Take 1 tablet (100 mg total) by mouth daily. For depression 03/26/18  Yes Lindell Spar I, NP  traZODone (DESYREL) 50 MG tablet Take 1 tablet (50 mg total) by mouth at bedtime as needed for sleep. 03/26/18  Yes Encarnacion Slates, NP    Family History Family History  Problem Relation Age of Onset  . Schizophrenia Mother   . Hypertension Mother   . Anesthesia problems Neg Hx   . Malignant hyperthermia Neg Hx   . Pseudochol deficiency Neg Hx   . Hypotension Neg Hx   . Migraines Neg Hx     Social History Social History   Tobacco Use  . Smoking status: Current Every Day Smoker    Packs/day: 1.00    Years: 15.00    Pack years: 15.00    Types: Cigarettes    Last attempt to  quit: 01/02/2017    Years since quitting: 1.4  . Smokeless tobacco: Never Used  . Tobacco comment: smoked since 42 yrs old  Substance Use Topics  . Alcohol use: No    Comment: denied all drug/alcohol use  . Drug use: Yes    Types: Marijuana    Comment: denied all drug/alcohol use     Allergies   Penicillins; Bee venom; and Coconut oil   Review of Systems Review of Systems  Constitutional: Negative for chills, fever and irritability.  HENT: Negative for ear pain and sore throat.   Eyes: Negative for pain and visual disturbance.  Respiratory: Negative for cough and shortness of breath.   Cardiovascular: Negative for chest pain and palpitations.  Gastrointestinal: Negative for abdominal pain and vomiting.  Genitourinary: Negative for dysuria and hematuria.  Musculoskeletal: Negative for arthralgias and back pain.  Skin: Negative for color change and rash.  Neurological: Negative for seizures and syncope.  Psychiatric/Behavioral: Positive for suicidal ideas. The patient does not have insomnia.   All other systems reviewed and are negative.    Physical Exam Updated Vital Signs  ED Triage Vitals [06/22/18 1927]  Enc Vitals Group     BP (!) 153/96     Pulse Rate (!) 55     Resp 18     Temp 98.4 F (36.9 C)     Temp Source Oral     SpO2 99 %     Weight 234 lb (106.1 kg)     Height 5' 6.5" (1.689 m)     Head Circumference      Peak Flow      Pain Score 0     Pain Loc      Pain Edu?      Excl. in [place]?     Physical Exam  Constitutional: She is oriented to person, place, and time. She appears well-developed and well-nourished. No distress.  HENT:  Head: Normocephalic and atraumatic.  Eyes: Conjunctivae and EOM are normal.  Neck: Normal range of motion. Neck supple.  Cardiovascular: Normal rate, regular rhythm, normal heart sounds and intact distal pulses.  No murmur heard. Pulmonary/Chest: Effort normal and breath sounds normal. No respiratory distress.  Abdominal:  Soft. There is no tenderness.  Musculoskeletal: Normal range of motion. She exhibits no edema.  Neurological: She is alert and oriented to person, place, and time.  Skin: Skin is warm and dry.  Psychiatric: Her speech is normal and behavior is normal. Her affect is blunt. Cognition and memory are normal. She expresses suicidal ideation. She expresses suicidal plans (overdose on pills).  Nursing note and vitals reviewed.    ED Treatments / Results  Labs (all labs ordered are listed, but only abnormal results are displayed) Labs Reviewed  COMPREHENSIVE METABOLIC PANEL - Abnormal; Notable for the following components:      Result Value  Chloride 112 (*)    Calcium 8.5 (*)    Albumin 3.4 (*)    All other components within normal limits  ACETAMINOPHEN LEVEL - Abnormal; Notable for the following components:   Acetaminophen (Tylenol), Serum <10 (*)    All other components within normal limits  CBC - Abnormal; Notable for the following components:   RBC 3.54 (*)    Hemoglobin 8.9 (*)    HCT 28.7 (*)    MCH 25.1 (*)    RDW 15.8 (*)    Platelets 649 (*)    All other components within normal limits  ETHANOL  SALICYLATE LEVEL  RAPID URINE DRUG SCREEN, HOSP PERFORMED  I-STAT BETA HCG BLOOD, ED ([facility], [facility], [facility] ONLY)    EKG None  Radiology No results found.  Procedures Procedures (including critical care time)  Medications Ordered in ED Medications  amLODipine (NORVASC) tablet 10 mg (has no administration in time range)  hydrochlorothiazide (HYDRODIURIL) tablet 25 mg (has no administration in time range)  hydrOXYzine (ATARAX/VISTARIL) tablet 25 mg (has no administration in time range)  lamoTRIgine (LAMICTAL) tablet 25 mg (has no administration in time range)  QUEtiapine (SEROQUEL) tablet 150 mg (150 mg Oral Given 06/22/18 2317)  sertraline (ZOLOFT) tablet 100 mg (has no administration in time range)  traZODone (DESYREL) tablet 50 mg (has no administration in time range)      Initial Impression / Assessment and Plan / ED Course  I have reviewed the triage vital signs and the nursing notes.  Pertinent labs & imaging results that were available during my care of the patient were reviewed by me and considered in my medical decision making (see chart for details).     Darnette Lampron Hollen is a 42 year old female with schizophrenia who presents to the ED with suicidal ideation, hallucinations.  Patient with normal vitals.  No fever.  Patient is here voluntarily.  She states that she is depressed and suicidal after the death of her aunt last week.  She states that she is hearing voices telling her to kill herself.  Patient states that she would overdose on pills.  She has no physical complaints.  Exam is overall unremarkable.  Lab work showed no significant anemia, electrolyte abnormality, kidney injury.  Patient had home medications ordered.    TTS evaluated the patient and recommend inpatient treatment.  Patient is happy with this plan and is voluntarily here.  Awaiting final psychiatric placement.  Final Clinical Impressions(s) / ED Diagnoses   Final diagnoses:  Suicidal ideation    ED Discharge Orders    None       Lennice Sites, DO 06/22/18 2326

## 2018-06-22 NOTE — ED Notes (Signed)
Bed: [facility] Expected date:  Expected time:  Means of arrival:  Comments: 

## 2018-06-22 NOTE — BH Assessment (Addendum)
Tele Assessment Note   Patient Name: KIFFANY SCHELLING MRN: [id number] Referring Physician: Dr. Vernell Barrier Location of Patient: [facility] Location of Provider: [place] [place] [facility] [place] is an 42 y.o. female presenting with SI with plan to overdose on pills in her home. Patient reported currently hearing command distant voices to kill herself. Patient reported command hallucinations started 1 day ago telling her to kill herself.  Patient reports trigger, grief/loss over aunts death, stating, "my aunt was like a mother to me". Patient reported her aunts death triggered her depression. Aunt died last week.  Patient reports living with her husband and mother n law.  Patient reported history of depression, bipolar and schizophrenia. Patient reported having depression since the age of 42 years old, stating that's when her mother left them with her grandmother and never came back, stating she would run away everyday to try and find her mom.  Patient reported no self-harming behaviors. Patient reports poor sleep and good appetite. Patient currently receiving services through [facility]. Patient presented on 05/25/18 to the ED with SI with plan to overdose, hang self or walk into traffic. Patient reported last SI attempt was approx. 5 years ago with attempted overdose on pills. ETOH negative and UDS negative. Patient is voluntary requesting help.  Patient was cooperative during assessment. Patient alert and oriented x4. Patient speech is slow and soft. Mood/Affect is depressed and sad. Thought processed are coherent.  Disposition: Initial Assessment Completed for this Encounter: Yes Lindon Romp, NP, patient meets inpatient criteria. TTS to secure placement. Doctor and RN notified of disposition.   Diagnosis: Depression, Bipolar and Schizophrenia  Past Medical History:  Past Medical History:  Diagnosis Date  . Anemia   . Anxiety   . Depression   . Elevated bilirubin    slight  elevation at 1.3  . Hypertension   . Hypokalemia   . Intracranial hypertension   . Migraines   . Mild intellectual disability   . Schizoaffective disorder [facility])     Past Surgical History:  Procedure Laterality Date  . NO PAST SURGERIES      Family History:  Family History  Problem Relation Age of Onset  . Schizophrenia Mother   . Hypertension Mother   . Anesthesia problems Neg Hx   . Malignant hyperthermia Neg Hx   . Pseudochol deficiency Neg Hx   . Hypotension Neg Hx   . Migraines Neg Hx     Social History:  reports that she has been smoking cigarettes. She has a 15.00 pack-year smoking history. She has never used smokeless tobacco. She reports that she has current or past drug history. Drug: Marijuana. She reports that she does not drink alcohol.  Additional Social History:  Alcohol / Drug Use Pain Medications: see MAR Prescriptions: see MAR Over the Counter: see MAR  CIWA: CIWA-Ar BP: (!) 153/96 Pulse Rate: (!) 55 COWS:    Allergies:  Allergies  Allergen Reactions  . Penicillins Anaphylaxis    Has patient had a PCN reaction causing immediate rash, facial/tongue/throat swelling, SOB or lightheadedness with hypotension: throat and face swelling. YES Has patient had a PCN reaction causing severe rash involving mucus membranes or skin necrosis: Yes Has patient had a PCN reaction that required hospitalization Yes Has patient had a PCN reaction occurring within the last 10 years: No If all of the above answers are "NO", then may proceed with Cephalosporin use.   . Bee Venom Swelling  . Coconut Oil Hives and Swelling  ALLERGIES TO COCONUT    Home Medications:  (Not in a hospital admission)  OB/GYN Status:  Patient's last menstrual period was 06/15/2018.  General Assessment Data [facility] of Assessment: [facility] TTS Assessment: In system Is this a Tele or Face-to-Face Assessment?: Face-to-Face Is this an Initial Assessment or a Re-assessment for this encounter?:  Initial Assessment Patient Accompanied by:: N/A Language Other than English: No Living Arrangements: (family home) What gender do you identify as?: Female Marital status: Married Pharmacist, community name: ([place]) Pregnancy Status: No Living Arrangements: Spouse/significant other Can pt return to current living arrangement?: Yes Admission Status: Voluntary Is patient capable of signing voluntary admission?: Yes Referral Source: Self/Family/Friend     Crisis Care Plan Living Arrangements: Spouse/significant other Legal Guardian: (self) Name of Psychiatrist: [facility] Name of Therapist: [facility]  Education Status Is patient currently in school?: No Is the patient employed, unemployed or receiving disability?: Unemployed  Risk to self with the past 6 months Suicidal Ideation: Yes-Currently Present Has patient been a risk to self within the past 6 months prior to admission? : Yes Suicidal Intent: Yes-Currently Present Has patient had any suicidal intent within the past 6 months prior to admission? : Yes Is patient at risk for suicide?: Yes Suicidal Plan?: Yes-Currently Present Has patient had any suicidal plan within the past 6 months prior to admission? : Yes Specify Current Suicidal Plan: (Plan to overdose) Access to Means: Yes Specify Access to Suicidal Means: (pills in the home) What has been your use of drugs/alcohol within the last 12 months?: (none) Previous Attempts/Gestures: Yes Other Self Harm Risks: (denied) Triggers for Past Attempts: (mental illness) Intentional Self Injurious Behavior: None Family Suicide History: No Recent stressful life event(s): Loss (Comment)(grief/loss aunt died 1 week ago) Persecutory voices/beliefs?: No Depression: Yes Depression Symptoms: Tearfulness, Isolating, Fatigue, Feeling worthless/self pity, Loss of interest in usual pleasures, Feeling angry/irritable Substance abuse history and/or treatment for substance abuse?: No  Risk to  Others within the past 6 months Homicidal Ideation: No Does patient have any lifetime risk of violence toward others beyond the six months prior to admission? : No Thoughts of Harm to Others: No Current Homicidal Intent: No Current Homicidal Plan: No Access to Homicidal Means: No Identified Victim: (n/a) History of harm to others?: No Assessment of Violence: None Noted Violent Behavior Description: (none) Does patient have access to weapons?: No Criminal Charges Pending?: No Does patient have a court date: No Is patient on probation?: No  Psychosis Hallucinations: Auditory(command voices to kill herself) Delusions: None noted  Mental Status Report Appearance/Hygiene: In scrubs Eye Contact: Fair Motor Activity: Unremarkable Speech: Soft, Slow Level of Consciousness: Quiet/awake Mood: Depressed, Sad Affect: Depressed, Sad Anxiety Level: Minimal Thought Processes: Coherent Judgement: Impaired Orientation: Person, Place, Time, Situation, Appropriate for developmental age Obsessive Compulsive Thoughts/Behaviors: None  Cognitive Functioning Concentration: Fair Memory: Recent Intact, Remote Intact Is patient IDD: No Insight: Fair Impulse Control: Poor Appetite: Good Have you had any weight changes? : No Change Sleep: No Change Total Hours of Sleep: (7) Vegetative Symptoms: None  ADLScreening [facility] Assessment Services) Patient's cognitive ability adequate to safely complete daily activities?: Yes Patient able to express need for assistance with ADLs?: Yes Independently performs ADLs?: Yes (appropriate for developmental age)  Prior Inpatient Therapy Prior Inpatient Therapy: Yes Prior Therapy Dates: 2019, 2018 Prior Therapy Facilty/Provider(s): [facility], [facility] Reason for Treatment: MH issues  Prior Outpatient Therapy Prior Outpatient Therapy: Yes Prior Therapy Dates: Ongoing Prior Therapy [facility]ilty[facility]): [facility] Reason for Treatment: Med mang Does patient  have an  ACCT team?: Yes Does patient have Intensive In-House Services?  : No Does patient have [facility] services? : No Does patient have P4CC services?: No  ADL Screening (condition at time of admission) Patient's cognitive ability adequate to safely complete daily activities?: Yes Patient able to express need for assistance with ADLs?: Yes Independently performs ADLs?: Yes (appropriate for developmental age)             Regulatory affairs officer (For Healthcare) Does Patient Have a Medical Advance Directive?: No    Additional Information 1:1 In Past 12 Months?: No CIRT Risk: No Elopement Risk: No Does patient have medical clearance?: Yes     Disposition:  Disposition Initial Assessment Completed for this Encounter: Yes Disposition of Patient: Admit Type of inpatient treatment program: Adult Patient refused recommended treatment: No  Lindon Romp, NP, patient meets inpatient criteria. TTS to secure placement. Doctor and RN notified of disposition.   This service was provided via telemedicine using a 2-way, interactive audio and video technology.  Names of all persons participating in this telemedicine service and their role in this encounter. Name: Enslee Kozlov Role: patient  Name: Kirtland Bouchard, [facility] Role: TTS Clinician  Name:  Role:   Name:  Role:     Venora Maples 06/22/2018 10:05 PM

## 2018-06-23 DIAGNOSIS — R45851 Suicidal ideations: Secondary | ICD-10-CM

## 2018-06-23 DIAGNOSIS — F251 Schizoaffective disorder, depressive type: Secondary | ICD-10-CM

## 2018-06-23 DIAGNOSIS — F17210 Nicotine dependence, cigarettes, uncomplicated: Secondary | ICD-10-CM

## 2018-06-23 DIAGNOSIS — Z560 Unemployment, unspecified: Secondary | ICD-10-CM

## 2018-06-23 LAB — URINALYSIS, ROUTINE W REFLEX MICROSCOPIC
Bilirubin Urine: NEGATIVE
Glucose, UA: NEGATIVE mg/dL
Ketones, ur: NEGATIVE mg/dL
Leukocytes, UA: NEGATIVE
NITRITE: NEGATIVE
Protein, ur: NEGATIVE mg/dL
SPECIFIC GRAVITY, URINE: 1.006 (ref 1.005–1.030)
pH: 6 (ref 5.0–8.0)

## 2018-06-23 MED ORDER — QUETIAPINE FUMARATE 100 MG PO TABS
200.0000 mg | ORAL_TABLET | Freq: Every day | ORAL | Status: DC
  Administered 2018-06-23: 200 mg via ORAL
  Filled 2018-06-23: qty 2

## 2018-06-23 NOTE — ED Notes (Signed)
Specimen cup provided. Pt encouraged to provide urine per MD order  

## 2018-06-23 NOTE — ED Notes (Signed)
Pt presents with SI, plan to overdose, pt hearing voices telling her to kill herself.  A&O x 3, calm & cooperative., no distress noted, monitoring for safety, Q 15 min checks in effect.

## 2018-06-23 NOTE — Consult Note (Addendum)
[facility] Face-to-Face Psychiatry Consult   Reason for Consult:  SI with plan. Referring Physician:  EDP Patient Identification: NAN MAYA MRN:  [id number] Principal Diagnosis: Schizoaffective disorder, depressive type ([place]) Diagnosis:   Patient Active Problem List   Diagnosis Date Noted  . Schizoaffective disorder ([place]) [F25.9] 07/29/2017  . MDD (major depressive disorder), recurrent episode, severe ([place]) [F33.2] 02/19/2017  . Schizoaffective disorder, bipolar type ([place]) [F25.0] 01/06/2017  . 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 IIH (idiopathic intracranial hypertension) [G93.2] 01/24/2016  . Cannabis use disorder, moderate, dependence ([place]) [F12.20] 08/18/2015  . Schizoaffective disorder, depressive type ([place]) [F25.1] 01/15/2012    Class: Acute    Total Time spent with patient: 30 minutes  Subjective:   Kriya N Marcel is a 42 y.o. female patient admitted with SI with plan to overdose.  HPI:   Ms. Waren reports SI with a plan with a plan to overdose in the setting of her aunt passing. She reports doing well prior to this death. She also endorses AVH for the past day. She denies HI. She reports medication compliance.   Past Psychiatric History: Schizoaffective disorder and prior history of suicide attempt by overdose.   Risk to Self: Suicidal Ideation: Yes-Currently Present Suicidal Intent: Yes-Currently Present Is patient at risk for suicide?: Yes Suicidal Plan?: Yes-Currently Present Specify Current Suicidal Plan: (Plan to overdose) Access to Means: Yes Specify Access to Suicidal Means: (pills in the home) What has been your use of drugs/alcohol within the last 12 months?: (none) Other Self Harm Risks: (denied) Triggers for Past Attempts: (mental illness) Intentional Self Injurious Behavior: None Risk to Others: Homicidal Ideation: No Thoughts of Harm to Others: No Current Homicidal Intent: No Current Homicidal Plan: No Access to Homicidal Means:  No Identified Victim: (n/a) History of harm to others?: No Assessment of Violence: None Noted Violent Behavior Description: (none) Does patient have access to weapons?: No Criminal Charges Pending?: No Does patient have a court date: No Prior Inpatient Therapy: Prior Inpatient Therapy: Yes Prior Therapy Dates: 2019, 2018 Prior Therapy Facilty/Provider(s): [facility] Reason for Treatment: MH issues Prior Outpatient Therapy: Prior Outpatient Therapy: Yes Prior Therapy Dates: Ongoing Prior Therapy Facilty/Provider(s): [facility] Reason for Treatment: Med mang Does patient have an ACCT team?: Yes Does patient have Intensive In-House Services?  : No Does patient have [facility] services? : No Does patient have P4CC services?: No  Past Medical History:  Past Medical History:  Diagnosis Date  . Anemia   . Anxiety   . Depression   . Elevated bilirubin    slight elevation at 1.3  . Hypertension   . Hypokalemia   . Intracranial hypertension   . Migraines   . Mild intellectual disability   . Schizoaffective disorder [facility])     Past Surgical History:  Procedure Laterality Date  . NO PAST SURGERIES     Family History:  Family History  Problem Relation Age of Onset  . Schizophrenia Mother   . Hypertension Mother   . Anesthesia problems Neg Hx   . Malignant hyperthermia Neg Hx   . Pseudochol deficiency Neg Hx   . Hypotension Neg Hx   . Migraines Neg Hx    Family Psychiatric  History: Denies  Social History:  Social History   Substance and Sexual Activity  Alcohol Use No   Comment: denied all drug/alcohol use     Social History   Substance and Sexual Activity  Drug Use Yes  . Types: Marijuana   Comment: denied all drug/alcohol  use    Social History   Socioeconomic History  . Marital status: Single    Spouse name: Not on file  . Number of children: 0  . Years of education: 48  . Highest education level: Not on file  Occupational History  . Occupation: Unemployed   Social Needs  . Financial resource strain: Not on file  . Food insecurity:    Worry: Not on file    Inability: Not on file  . Transportation needs:    Medical: Not on file    Non-medical: Not on file  Tobacco Use  . Smoking status: Current Every Day Smoker    Packs/day: 1.00    Years: 15.00    Pack years: 15.00    Types: Cigarettes    Last attempt to quit: 01/02/2017    Years since quitting: 1.4  . Smokeless tobacco: Never Used  . Tobacco comment: smoked since 42 yrs old  Substance and Sexual Activity  . Alcohol use: No    Comment: denied all drug/alcohol use  . Drug use: Yes    Types: Marijuana    Comment: denied all drug/alcohol use  . Sexual activity: Yes    Birth control/protection: None  Lifestyle  . Physical activity:    Days per week: Not on file    Minutes per session: Not on file  . Stress: Not on file  Relationships  . Social connections:    Talks on phone: Not on file    Gets together: Not on file    Attends religious service: Not on file    Active member of club or organization: Not on file    Attends meetings of clubs or organizations: Not on file    Relationship status: Not on file  Other Topics Concern  . Not on file  Social History Narrative   Lives with fiance, Erlene Quan   Caffeine use: Soda: 1 soda daily   No coffee   No tea   Additional Social History: She denies alcohol or illicit substance use.     Allergies:   Allergies  Allergen Reactions  . Penicillins Anaphylaxis    Has patient had a PCN reaction causing immediate rash, facial/tongue/throat swelling, SOB or lightheadedness with hypotension: throat and face swelling. YES Has patient had a PCN reaction causing severe rash involving mucus membranes or skin necrosis: Yes Has patient had a PCN reaction that required hospitalization Yes Has patient had a PCN reaction occurring within the last 10 years: No If all of the above answers are "NO", then may proceed with Cephalosporin use.   .  Bee Venom Swelling  . Coconut Oil Hives and Swelling    ALLERGIES TO COCONUT    Labs:  Results for orders placed or performed during the hospital encounter of 06/22/18 (from the past 48 hour(s))  Comprehensive metabolic panel     Status: Abnormal   Collection Time: 06/22/18  7:42 PM  Result Value Ref Range   Sodium 144 135 - 145 mmol/L   Potassium 3.7 3.5 - 5.1 mmol/L   Chloride 112 (H) 98 - 111 mmol/L   CO2 26 22 - 32 mmol/L   Glucose, Bld 88 70 - 99 mg/dL   BUN 14 6 - 20 mg/dL   Creatinine, Ser 0.91 0.44 - 1.00 mg/dL   Calcium 8.5 (L) 8.9 - 10.3 mg/dL   Total Protein 7.1 6.5 - 8.1 g/dL   Albumin 3.4 (L) 3.5 - 5.0 g/dL   AST 20 15 - 41 U/L  ALT 13 0 - 44 U/L   Alkaline Phosphatase 74 38 - 126 U/L   Total Bilirubin 0.7 0.3 - 1.2 mg/dL   GFR calc non Af Amer >60 >60 mL/min   GFR calc Af Amer >60 >60 mL/min    Comment: (NOTE) The eGFR has been calculated using the CKD EPI equation. This calculation has not been validated in all clinical situations. eGFR's persistently <60 mL/min signify possible Chronic Kidney Disease.    Anion gap 6 5 - 15    Comment: Performed at [facility], [place] [address], [place], [place] [address]  Ethanol     Status: None   Collection Time: 06/22/18  7:42 PM  Result Value Ref Range   Alcohol, Ethyl (B) <10 <10 mg/dL    Comment: (NOTE) Lowest detectable limit for serum alcohol is 10 mg/dL. For medical purposes only. Performed at [facility], [place] [address] [place], [place] [address]   Salicylate level     Status: None   Collection Time: 06/22/18  7:42 PM  Result Value Ref Range   Salicylate Lvl <4.1 2.8 - 30.0 mg/dL    Comment: Performed at [facility], [place] [address], [place], [place] [address]  Acetaminophen level     Status: Abnormal   Collection Time: 06/22/18  7:42 PM  Result Value Ref Range   Acetaminophen (Tylenol), Serum <10 (L) 10 - 30 ug/mL    Comment: (NOTE) Therapeutic  concentrations vary significantly. A range of 10-30 ug/mL  may be an effective concentration for many patients. However, some  are best treated at concentrations outside of this range. Acetaminophen concentrations >150 ug/mL at 4 hours after ingestion  and >50 ug/mL at 12 hours after ingestion are often associated with  toxic reactions. Performed at [facility], [place] [address], [place], [place] [address]   cbc     Status: Abnormal   Collection Time: 06/22/18  7:42 PM  Result Value Ref Range   WBC 6.1 4.0 - 10.5 K/uL   RBC 3.54 (L) 3.87 - 5.11 MIL/uL   Hemoglobin 8.9 (L) 12.0 - 15.0 g/dL   HCT 28.7 (L) 36.0 - 46.0 %   MCV 81.1 78.0 - 100.0 fL   MCH 25.1 (L) 26.0 - 34.0 pg   MCHC 31.0 30.0 - 36.0 g/dL   RDW 15.8 (H) 11.5 - 15.5 %   Platelets 649 (H) 150 - 400 K/uL    Comment: Performed at [facility], [place] [address] [place], [place] [address]  I-Stat beta hCG blood, ED     Status: None   Collection Time: 06/22/18  7:50 PM  Result Value Ref Range   I-stat hCG, quantitative <5.0 <5 mIU/mL   Comment 3            Comment:   GEST. AGE      CONC.  (mIU/mL)   <=1 WEEK        5 - 50     2 WEEKS       50 - 500     3 WEEKS       100 - 10,000     4 WEEKS     1,000 - 30,000        FEMALE AND NON-PREGNANT FEMALE:     LESS THAN 5 mIU/mL   Rapid urine drug screen (hospital performed)     Status: None   Collection Time: 06/22/18  8:27 PM  Result Value Ref Range  Opiates NONE DETECTED NONE DETECTED   Cocaine NONE DETECTED NONE DETECTED   Benzodiazepines NONE DETECTED NONE DETECTED   Amphetamines NONE DETECTED NONE DETECTED   Tetrahydrocannabinol NONE DETECTED NONE DETECTED   Barbiturates NONE DETECTED NONE DETECTED    Comment: (NOTE) DRUG SCREEN FOR MEDICAL PURPOSES ONLY.  IF CONFIRMATION IS NEEDED FOR ANY PURPOSE, NOTIFY LAB WITHIN 5 DAYS. LOWEST DETECTABLE LIMITS FOR URINE DRUG SCREEN Drug Class                     Cutoff (ng/mL) Amphetamine  and metabolites    1000 Barbiturate and metabolites    200 Benzodiazepine                 846 Tricyclics and metabolites     300 Opiates and metabolites        300 Cocaine and metabolites        300 THC                            50 Performed at [facility], [place] [address], [place], [place] [address]   Urinalysis, Routine w reflex microscopic     Status: Abnormal   Collection Time: 06/23/18  9:52 AM  Result Value Ref Range   Color, Urine COLORLESS (A) YELLOW   APPearance CLEAR CLEAR   Specific Gravity, Urine 1.006 1.005 - 1.030   pH 6.0 5.0 - 8.0   Glucose, UA NEGATIVE NEGATIVE mg/dL   Hgb urine dipstick SMALL (A) NEGATIVE   Bilirubin Urine NEGATIVE NEGATIVE   Ketones, ur NEGATIVE NEGATIVE mg/dL   Protein, ur NEGATIVE NEGATIVE mg/dL   Nitrite NEGATIVE NEGATIVE   Leukocytes, UA NEGATIVE NEGATIVE   WBC, UA 0-5 0 - 5 WBC/hpf   Bacteria, UA RARE (A) NONE SEEN   Mucus PRESENT    Hyaline Casts, UA PRESENT     Comment: Performed at [facility], [place] [address] [place], [place] [address]    Current Facility-Administered Medications  Medication Dose Route Frequency Provider Last Rate Last Dose  . amLODipine (NORVASC) tablet 10 mg  10 mg Oral Daily Curatolo, Adam, DO   10 mg at 06/23/18 0935  . hydrochlorothiazide (HYDRODIURIL) tablet 25 mg  25 mg Oral Daily Curatolo, Adam, DO   25 mg at 06/23/18 0935  . hydrOXYzine (ATARAX/VISTARIL) tablet 25 mg  25 mg Oral Q6H PRN Curatolo, Adam, DO      . lamoTRIgine (LAMICTAL) tablet 25 mg  25 mg Oral Daily Curatolo, Adam, DO   25 mg at 06/23/18 7939  . QUEtiapine (SEROQUEL) tablet 150 mg  150 mg Oral QHS Curatolo, Adam, DO   150 mg at 06/22/18 2317  . sertraline (ZOLOFT) tablet 100 mg  100 mg Oral Daily Curatolo, Adam, DO   100 mg at 06/23/18 0937  . traZODone (DESYREL) tablet 50 mg  50 mg Oral QHS PRN Lennice Sites, DO       Current Outpatient Medications  Medication Sig Dispense Refill  . amLODipine  (NORVASC) 10 MG tablet Take 1 tablet (10 mg total) by mouth daily. For high blood pressure 10 tablet 0  . ARIPiprazole (ABILIFY) 5 MG tablet Take 1 tablet (5 mg total) by mouth daily. For mood control 15 tablet 0  . ARIPiprazole ER (ABILIFY MAINTENA) 400 MG SRER injection Inject 2 mLs (400 mg total) into the muscle every 28 (twenty-eight) days. (Due on 07-150-19): For mood control (Patient taking differently:  Inject 400 mg into the muscle every 28 (twenty-eight) days. (Due on 05-21-18): For mood control) 1 each 0  . hydrochlorothiazide (HYDRODIURIL) 25 MG tablet Take 1 tablet (25 mg total) by mouth daily. For high blood pressure 10 tablet 0  . hydrOXYzine (ATARAX/VISTARIL) 25 MG tablet Take 1 tablet (25 mg total) by mouth every 6 (six) hours as needed for anxiety. 60 tablet 0  . lamoTRIgine (LAMICTAL) 25 MG tablet Take 1 tablet (25 mg total) by mouth daily. For mood stabilization 30 tablet 0  . QUEtiapine (SEROQUEL) 100 MG tablet Take 150 mg by mouth at bedtime.    . sertraline (ZOLOFT) 100 MG tablet Take 1 tablet (100 mg total) by mouth daily. For depression 30 tablet 0  . traZODone (DESYREL) 50 MG tablet Take 1 tablet (50 mg total) by mouth at bedtime as needed for sleep. 30 tablet 0    Musculoskeletal: Strength & Muscle Tone: within normal limits Gait & Station: UTA since patient was lying in bed. Patient leans: N/A  Psychiatric Specialty Exam: Physical Exam  Nursing note and vitals reviewed. Constitutional: She is oriented to person, place, and time. She appears well-developed and well-nourished.  HENT:  Head: Normocephalic and atraumatic.  Neck: Normal range of motion.  Respiratory: Effort normal.  Musculoskeletal: Normal range of motion.  Neurological: She is alert and oriented to person, place, and time.  Psychiatric: Her speech is normal and behavior is normal. Judgment and thought content normal. Cognition and memory are normal. She exhibits a depressed mood.    Review of Systems   Psychiatric/Behavioral: Positive for depression, hallucinations and suicidal ideas. Negative for substance abuse.  All other systems reviewed and are negative.   Blood pressure 137/88, pulse 60, temperature 97.9 F (36.6 C), temperature source Oral, resp. rate 16, height 5' 6.5" (1.689 m), weight 106.1 kg, last menstrual period 06/15/2018, SpO2 100 %.Body mass index is 37.2 kg/m.  General Appearance: Fairly Groomed, overweight, middle aged, African American female, wearing paper hospital scrubs with short hair and hirsutism who is lying in bed. NAD.   Eye Contact:  Good  Speech:  Clear and Coherent and Normal Rate  Volume:  Normal  Mood:  Depressed  Affect:  Congruent  Thought Process:  Goal Directed, Linear and Descriptions of Associations: Intact  Orientation:  Full (Time, Place, and Person)  Thought Content:  Logical and Hallucinations: Auditory Visual  Suicidal Thoughts:  Yes.  with intent/plan  Homicidal Thoughts:  No  Memory:  Immediate;   Good Recent;   Good Remote;   Good  Judgement:  Fair  Insight:  Fair  Psychomotor Activity:  Normal  Concentration:  Concentration: Good and Attention Span: Good  Recall:  Good  Fund of Knowledge:  Good  Language:  Good  Akathisia:  No  Handed:  Right  AIMS (if indicated):   N/A  Assets:  Communication Skills Desire for Improvement Social Support  ADL's:  Intact  Cognition:  WNL. History of mild IDD.   Sleep:   N/A   Assessment:  Mckinzey Entwistle Cappelli is a 42 y.o. female who was admitted with SI with a plan to overdose for the past day due to loss of her aunt. She will be monitored overnight for safety. Will increase Seroquel for mood stabilization and hallucinations.   Treatment Plan Summary: Daily contact with patient to assess and evaluate symptoms and progress in treatment and Medication management  -Continue Lamictal 25 mg daily for mood stabilization. -Continue Zoloft 100 mg daily for depression.  -  Increase  Seroquel 150 mg qhs  to 200 mg qhs for mood stabilization and psychosis. -EKG ordered to monitor for QTc prolongation.   Disposition: Overnight observation.  Faythe Dingwall, DO 06/23/2018 12:24 PM

## 2018-06-23 NOTE — ED Notes (Signed)
Patient is resting comfortably. 

## 2018-06-23 NOTE — ED Notes (Signed)
EKG completed and given to EDP [name] for review. No new orders provided.

## 2018-06-23 NOTE — ED Notes (Signed)
Burna Mortimer RN looked over patient's chart-to go to secure area-security called for wand

## 2018-06-23 NOTE — ED Notes (Signed)
Bed: [facility] Expected date:  Expected time:  Means of arrival:  Comments: hw D

## 2018-06-23 NOTE — ED Notes (Signed)
Pt behavior cooperative, pleasant on approach. Endorsing SI and AH. Reports recent death in family. Encouragement and support provided. Special checks q 15 mins in place for safety, Video monitoring in place. Will continue to monitor.

## 2018-06-24 MED ORDER — QUETIAPINE FUMARATE 200 MG PO TABS: 200.0000 mg | ORAL_TABLET | Freq: Every day | ORAL | 0 refills | Status: AC

## 2018-06-24 NOTE — ED Notes (Signed)
Patient denies pain and is resting comfortably.  

## 2018-06-24 NOTE — ED Notes (Signed)
Patient reports SI with no plan and intent. Patient also reports AH. Patient denies HI/VH. Plan of care discussed. Encouragement and support provided and safety maintain. Q 15 min safety checks remain and video monitoring.

## 2018-06-24 NOTE — Discharge Instructions (Signed)
-  Continue your medications as prescribed. Seroquel was increased to 200 mg daily. -Follow up with your outpatient provider.

## 2018-06-24 NOTE — Consult Note (Addendum)
[facility] ED Discharge  06/24/2018 11:47 AM Sarah [name]  MRN:  [id number] Principal Problem: Schizoaffective disorder, depressive type [facility]) Discharge Diagnoses:  Patient Active Problem List   Diagnosis Date Noted  . Schizoaffective disorder ([place]) [F25.9] 07/29/2017  . MDD (major depressive disorder), recurrent episode, severe ([place]) [F33.2] 02/19/2017  . Schizoaffective disorder, bipolar type ([place]) [F25.0] 01/06/2017  . 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 IIH (idiopathic intracranial hypertension) [G93.2] 01/24/2016  . Cannabis use disorder, moderate, dependence ([place]) [F12.20] 08/18/2015  . Schizoaffective disorder, depressive type ([place]) [F25.1] 01/15/2012    Class: Acute    Subjective:  Sarah [name] reports feeling depressed and SI. She denies a plan or intention to harm self. She has a history of chronic and fleeting SI at baseline. She denies thoughts to harm others and does not appear to be responding to internal stimuli. She reports sleeping well overnight. Seroquel was increased for mood stabilization and AH. She lives at home with her husband and mother-in-law and is followed by Teacher, music.  Total Time spent with patient: 15 minutes  Past Psychiatric History: Schizoaffective disorder and history of suicide attempt by overdose.   Past Medical History:  Past Medical History:  Diagnosis Date  . Anemia   . Anxiety   . Depression   . Elevated bilirubin    slight elevation at 1.3  . Hypertension   . Hypokalemia   . Intracranial hypertension   . Migraines   . Mild intellectual disability   . Schizoaffective disorder [facility])    Past Surgical History:  Procedure Laterality Date  . NO PAST SURGERIES     Family History:  Family History  Problem Relation Age of Onset  . Schizophrenia Mother   . Hypertension Mother   . Anesthesia problems Neg Hx   . Malignant hyperthermia Neg Hx   . Pseudochol deficiency Neg Hx   . Hypotension Neg Hx   . Migraines Neg Hx     Family Psychiatric  History: Mother-schizophrenia.  Social History:  Social History   Substance and Sexual Activity  Alcohol Use No   Comment: denied all drug/alcohol use    Social History   Substance and Sexual Activity  Drug Use Yes  . Types: Marijuana   Comment: denied all drug/alcohol use   Social History   Socioeconomic History  . Marital status: Single    Spouse name: Not on file  . Number of children: 0  . Years of education: 15  . Highest education level: Not on file  Occupational History  . Occupation: Unemployed  Social Needs  . Financial resource strain: Not on file  . Food insecurity:    Worry: Not on file    Inability: Not on file  . Transportation needs:    Medical: Not on file    Non-medical: Not on file  Tobacco Use  . Smoking status: Current Every Day Smoker    Packs/day: 1.00    Years: 15.00    Pack years: 15.00    Types: Cigarettes    Last attempt to quit: 01/02/2017    Years since quitting: 1.4  . Smokeless tobacco: Never Used  . Tobacco comment: smoked since 42 yrs old  Substance and Sexual Activity  . Alcohol use: No    Comment: denied all drug/alcohol use  . Drug use: Yes    Types: Marijuana    Comment: denied all drug/alcohol use  . Sexual activity: Yes    Birth control/protection: None  Lifestyle  . Physical activity:  Days per week: Not on file    Minutes per session: Not on file  . Stress: Not on file  Relationships  . Social connections:    Talks on phone: Not on file    Gets together: Not on file    Attends religious service: Not on file    Active member of club or organization: Not on file    Attends meetings of clubs or organizations: Not on file    Relationship status: Not on file  Other Topics Concern  . Not on file  Social History Narrative   Lives with fiance, Sarah [name]   Caffeine use: Soda: 1 soda daily   No coffee   No tea    Has this patient used any form of tobacco in the last 30 days? (Cigarettes,  Smokeless Tobacco, Cigars, and/or Pipes) Prescription not provided because: N/A  Current Medications: Current Facility-Administered Medications  Medication Dose Route Frequency Provider Last Rate Last Dose  . amLODipine (NORVASC) tablet 10 mg  10 mg Oral Daily Curatolo, Adam, DO   10 mg at 06/24/18 1037  . hydrochlorothiazide (HYDRODIURIL) tablet 25 mg  25 mg Oral Daily Curatolo, Adam, DO   25 mg at 06/24/18 1037  . hydrOXYzine (ATARAX/VISTARIL) tablet 25 mg  25 mg Oral Q6H PRN Curatolo, Adam, DO      . lamoTRIgine (LAMICTAL) tablet 25 mg  25 mg Oral Daily Curatolo, Adam, DO   25 mg at 06/24/18 1037  . QUEtiapine (SEROQUEL) tablet 200 mg  200 mg Oral QHS Faythe Dingwall, DO   200 mg at 06/23/18 2143  . sertraline (ZOLOFT) tablet 100 mg  100 mg Oral Daily Curatolo, Adam, DO   100 mg at 06/24/18 1036  . traZODone (DESYREL) tablet 50 mg  50 mg Oral QHS PRN Lennice Sites, DO       Current Outpatient Medications  Medication Sig Dispense Refill  . amLODipine (NORVASC) 10 MG tablet Take 1 tablet (10 mg total) by mouth daily. For high blood pressure 10 tablet 0  . ARIPiprazole (ABILIFY) 5 MG tablet Take 1 tablet (5 mg total) by mouth daily. For mood control 15 tablet 0  . ARIPiprazole ER (ABILIFY MAINTENA) 400 MG SRER injection Inject 2 mLs (400 mg total) into the muscle every 28 (twenty-eight) days. (Due on 07-150-19): For mood control (Patient taking differently: Inject 400 mg into the muscle every 28 (twenty-eight) days. (Due on 05-21-18): For mood control) 1 each 0  . hydrochlorothiazide (HYDRODIURIL) 25 MG tablet Take 1 tablet (25 mg total) by mouth daily. For high blood pressure 10 tablet 0  . hydrOXYzine (ATARAX/VISTARIL) 25 MG tablet Take 1 tablet (25 mg total) by mouth every 6 (six) hours as needed for anxiety. 60 tablet 0  . lamoTRIgine (LAMICTAL) 25 MG tablet Take 1 tablet (25 mg total) by mouth daily. For mood stabilization 30 tablet 0  . QUEtiapine (SEROQUEL) 100 MG tablet Take 150 mg  by mouth at bedtime.    . sertraline (ZOLOFT) 100 MG tablet Take 1 tablet (100 mg total) by mouth daily. For depression 30 tablet 0  . traZODone (DESYREL) 50 MG tablet Take 1 tablet (50 mg total) by mouth at bedtime as needed for sleep. 30 tablet 0   PTA Medications:  (Not in a hospital admission)  Musculoskeletal: Strength & Muscle Tone: within normal limits Gait & Station: UTA since patient in lying in bed. Patient leans: N/A  Psychiatric Specialty Exam: Physical Exam  Nursing note and vitals reviewed. Constitutional: She  is oriented to person, place, and time. She appears well-developed and well-nourished.  HENT:  Head: Normocephalic and atraumatic.  Neck: Normal range of motion.  Respiratory: Effort normal.  Musculoskeletal: Normal range of motion.  Neurological: She is alert and oriented to person, place, and time.  Psychiatric: Her speech is normal and behavior is normal. Judgment and thought content normal. Cognition and memory are normal. She exhibits a depressed mood.    Review of Systems  Psychiatric/Behavioral: Positive for depression and suicidal ideas (Chronic and at baseline). Negative for substance abuse. The patient does not have insomnia.   All other systems reviewed and are negative.   Blood pressure 124/68, pulse (!) 59, temperature 98 F (36.7 C), temperature source Oral, resp. rate 18, height 5' 6.5" (1.689 m), weight 106.1 kg, last menstrual period 06/15/2018, SpO2 95 %.Body mass index is 37.2 kg/m.  General Appearance: Fairly Groomed, middle aged, African American female, wearing paper hospital scrubs and lying in bed. NAD.   Eye Contact:  Good  Speech:  Clear and Coherent and Normal Rate  Volume:  Normal  Mood:  Depressed  Affect:  Congruent  Thought Process:  Goal Directed, Linear and Descriptions of Associations: Intact  Orientation:  Full (Time, Place, and Person)  Thought Content:  Logical  Suicidal Thoughts:  Yes.  without intent/plan. Chronic and  at baseline.  Homicidal Thoughts:  No  Memory:  Immediate;   Good Recent;   Good Remote;   Good  Judgement:  Fair  Insight:  Fair  Psychomotor Activity:  Normal  Concentration:  Concentration: Good and Attention Span: Good  Recall:  Good  Fund of Knowledge:  Good  Language:  Good  Akathisia:  No  Handed:  Right  AIMS (if indicated):   N/A  Assets:  Communication Skills Desire for Improvement Housing Physical Health Social Support  ADL's:  Intact  Cognition:  WNL  Sleep:   N/A     Demographic Factors:  NA  Loss Factors: Loss of significant relationship  Historical Factors: Prior suicide attempts and Family history of mental illness or substance abuse  Risk Reduction Factors:   Sense of responsibility to family, Living with another person, especially a relative, Positive social support and Positive therapeutic relationship  Continued Clinical Symptoms:  Schizoaffective disorder.  Cognitive Features That Contribute To Risk:  Thought constriction (tunnel vision)    Suicide Risk:  Minimal: No identifiable suicidal ideation.  Patients presenting with no risk factors but with morbid ruminations; may be classified as minimal risk based on the severity of the depressive symptoms    Plan Of Care/Follow-up recommendations:  -Continue Lamictal 25 mg daily for mood stabilization. -Continue Zoloft 100 mg daily for depression.  -Continue Seroquel 200 mg qhs for mood stabilization and psychosis. -Continue monthly Abilify 400 mg injection. Last received a week ago.  -EKG reviewed and QTc 397. Please closely monitor when starting or increasing QTc prolonging agents.  -Follow up with outpatient provider.   Disposition: Discharge home. Faythe Dingwall, DO 06/24/2018, 11:47 AM

## 2019-07-29 IMAGING — US US ABDOMEN LIMITED
1 series · 14 of 25 positions shown · non-contrast
Comparison: 04/01/2016

CLINICAL DATA: Right upper quadrant pain for 2 days

EXAM:
ULTRASOUND ABDOMEN LIMITED RIGHT UPPER QUADRANT

[Series 1: us abdomen limited · 0.22mm/px · 14 of 37 slices shown]
[im 1/37]
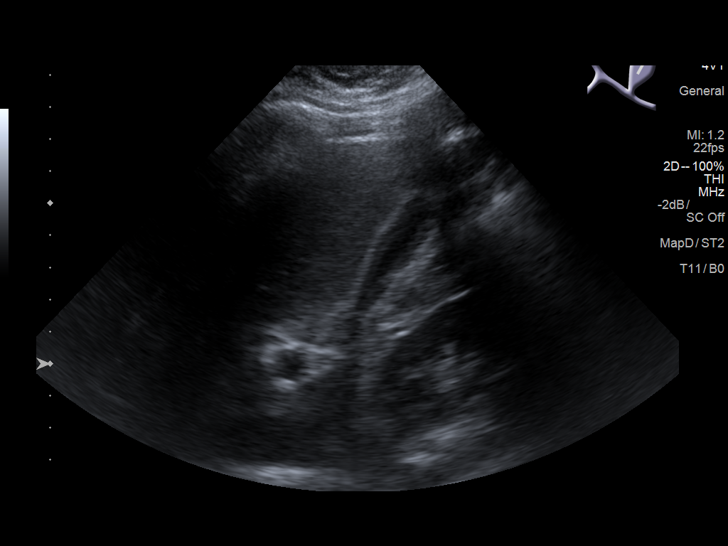
[im 4/37]
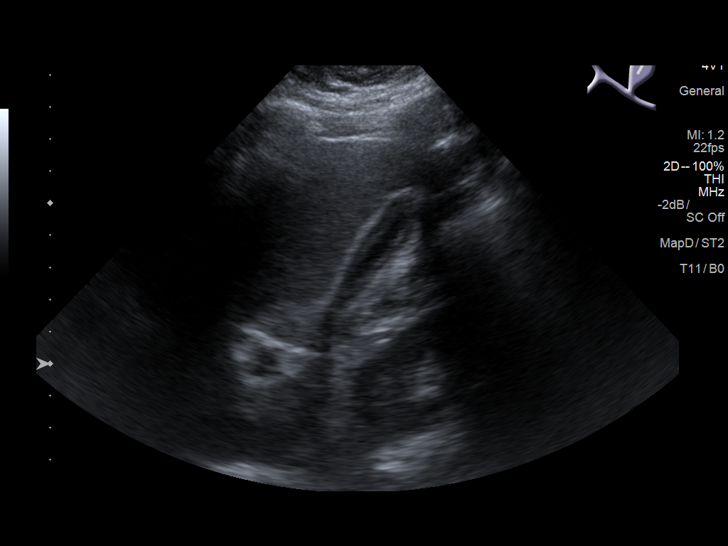
[im 7/37]
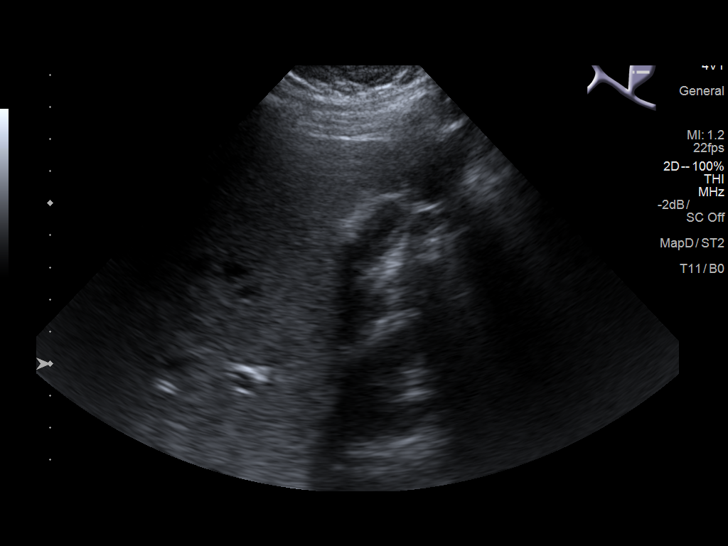
[im 10/37]
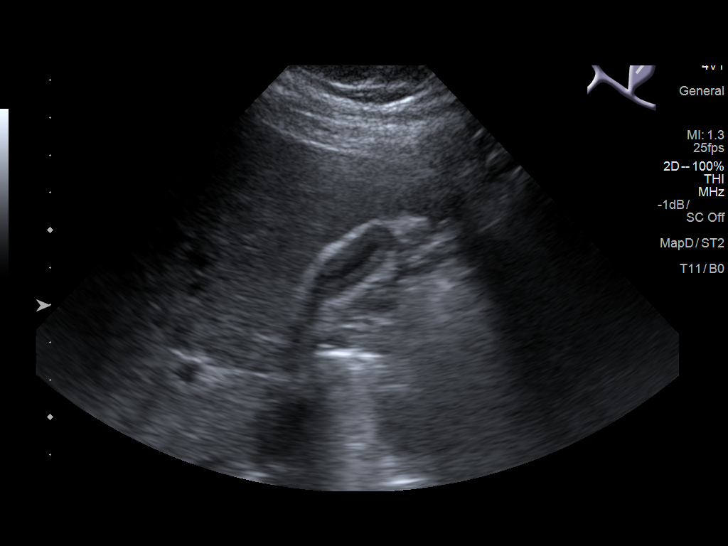
[im 13/37]
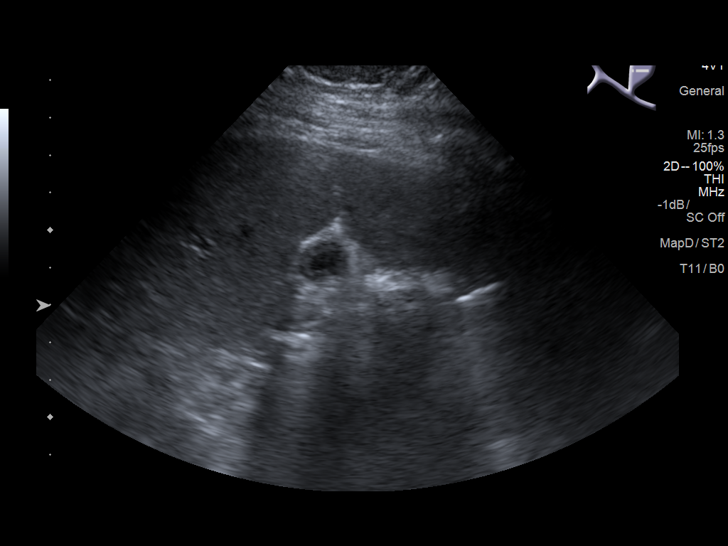
[im 14/37]
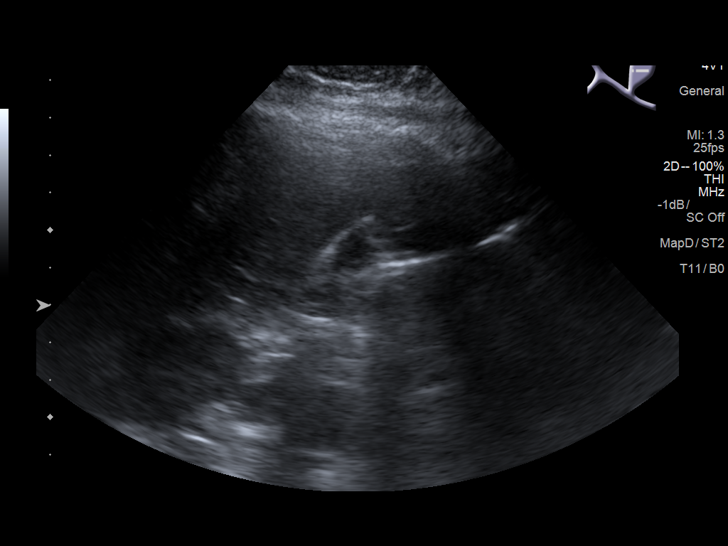
[im 17/37]
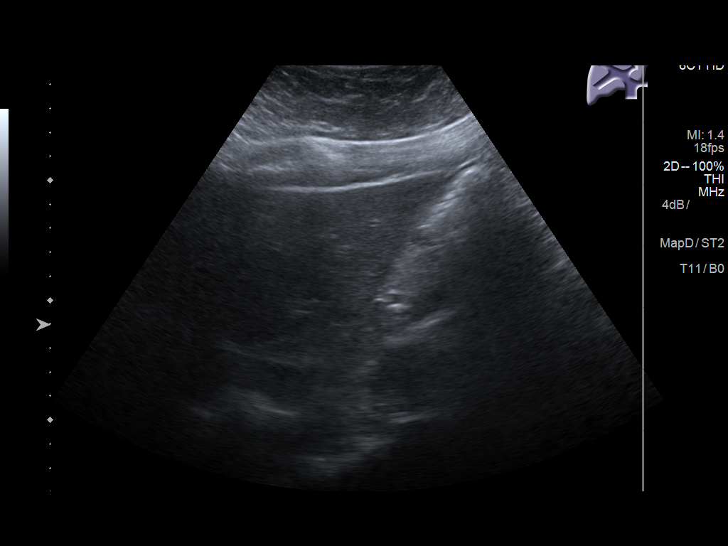
[im 20/37]
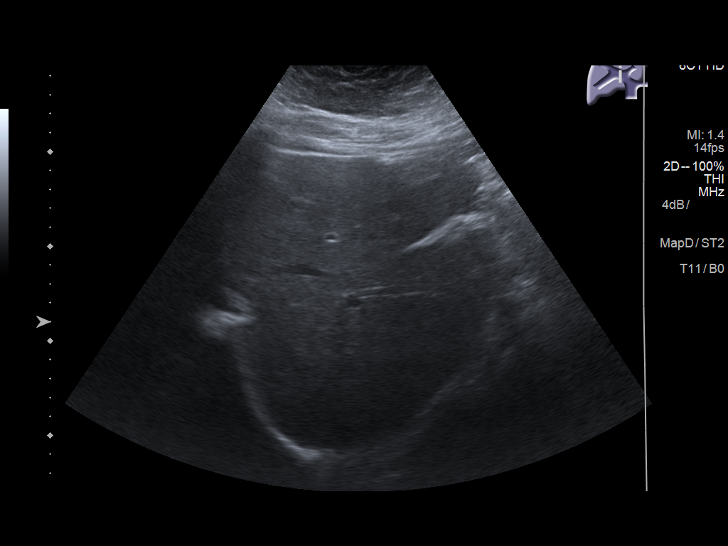
[im 23/37]
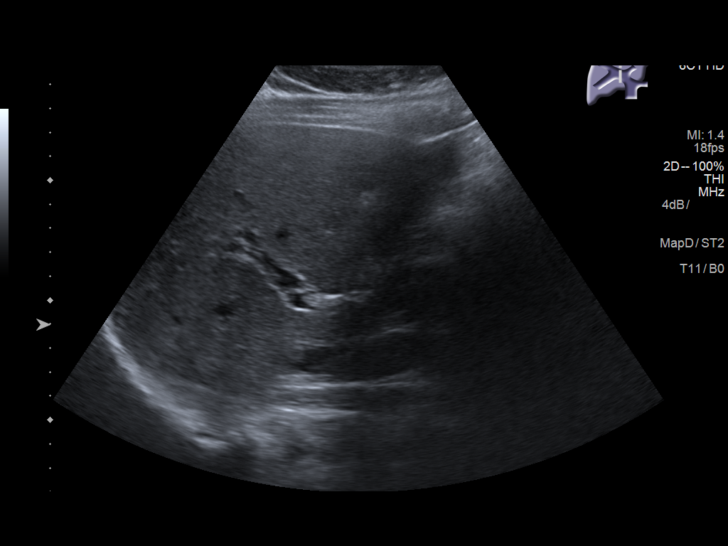
[im 25/37]
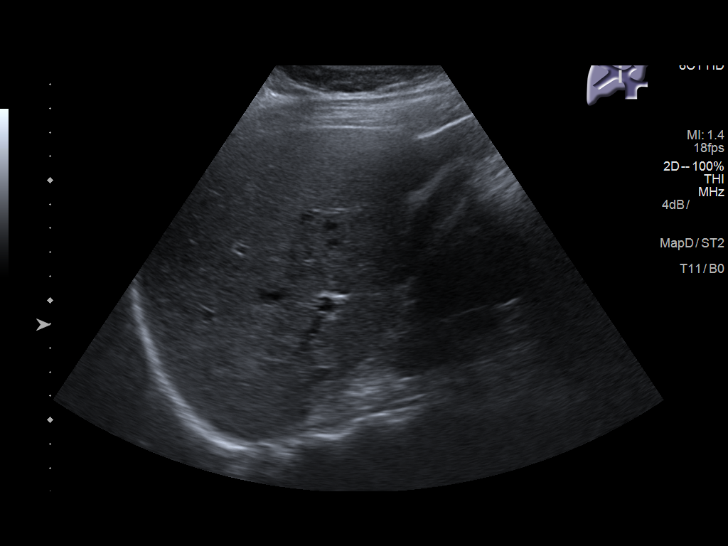
[im 28/37]
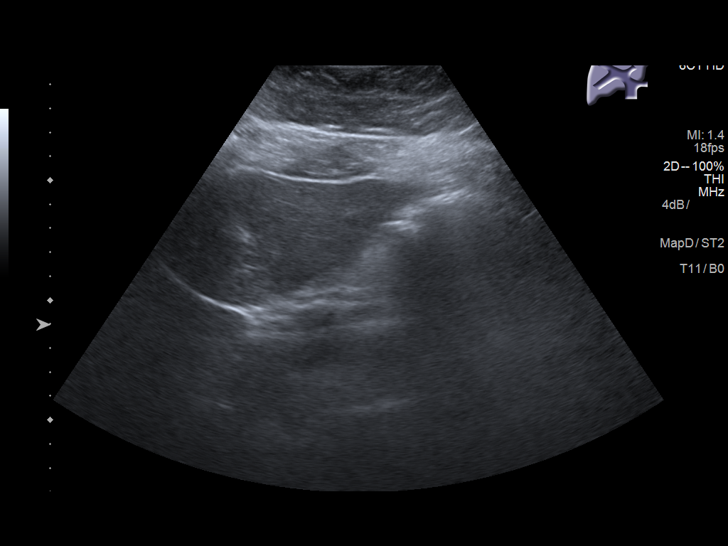
[im 31/37]
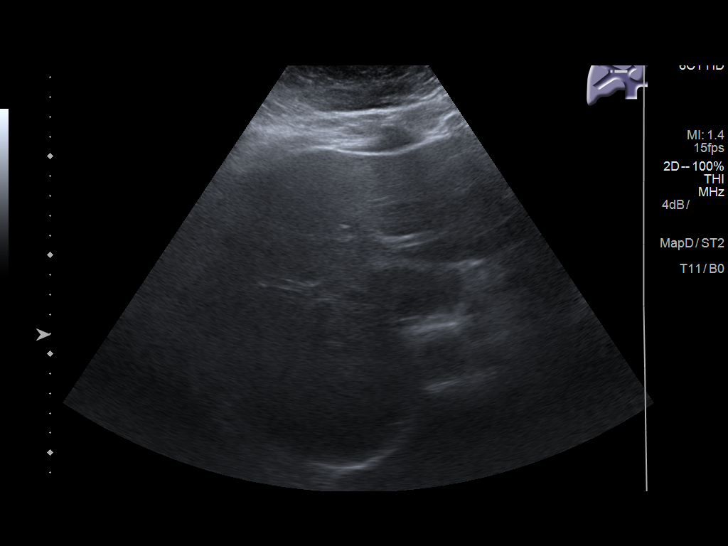
[im 34/37]
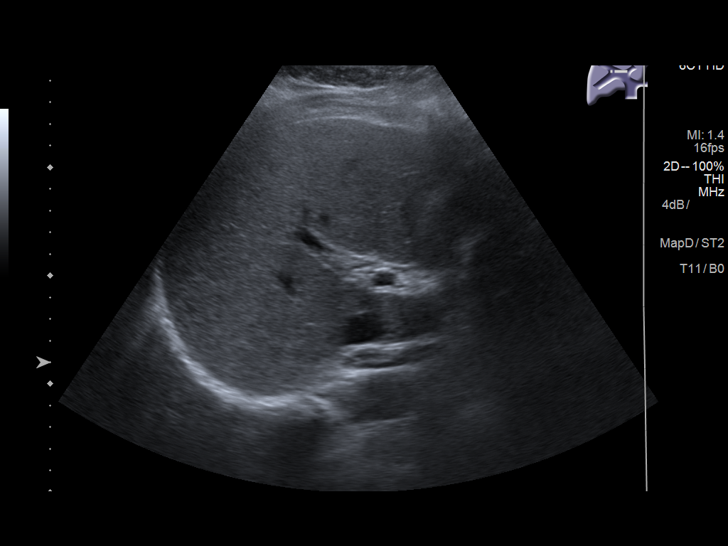
[im 37/37]
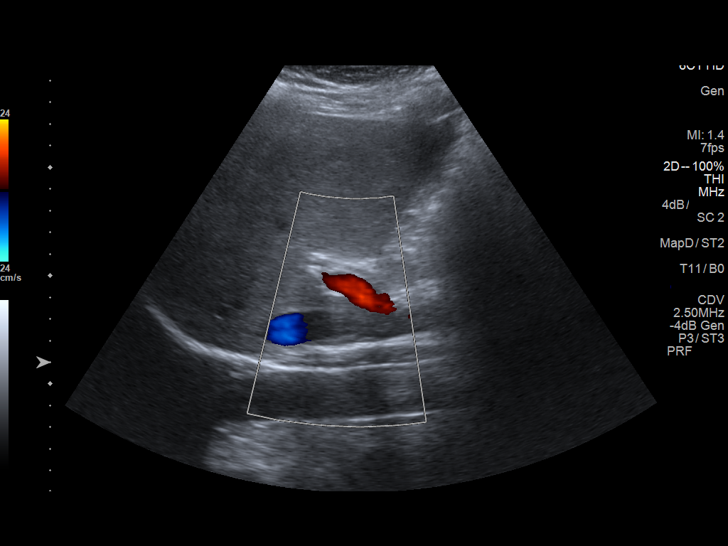

[14 of 25 positions shown; findings below may reference images not displayed]

FINDINGS: Gallbladder:

Contracted. No gallstones are seen. No pericholecystic fluid is
noted.

Common bile duct:

Diameter: 3.8 mm.

Liver:

Mildly increased in echogenicity consistent with fatty infiltration.
No mass lesion is noted. Portal vein is patent on color Doppler
imaging with normal direction of blood flow towards the liver.
IMPRESSION: Fatty liver.
